# Patient Record
Sex: Female | Born: 1935
Health system: Southern US, Community
[De-identification: ages and names within clinical notes are randomized; demographics above are authoritative.]

## PROBLEM LIST (undated history)

## (undated) DIAGNOSIS — N189 Chronic kidney disease, unspecified: Secondary | ICD-10-CM

## (undated) DIAGNOSIS — R519 Headache, unspecified: Secondary | ICD-10-CM

## (undated) DIAGNOSIS — R51 Headache: Secondary | ICD-10-CM

## (undated) DIAGNOSIS — M1990 Unspecified osteoarthritis, unspecified site: Secondary | ICD-10-CM

## (undated) DIAGNOSIS — I10 Essential (primary) hypertension: Secondary | ICD-10-CM

## (undated) DIAGNOSIS — R42 Dizziness and giddiness: Secondary | ICD-10-CM

## (undated) DIAGNOSIS — E7800 Pure hypercholesterolemia, unspecified: Secondary | ICD-10-CM

## (undated) DIAGNOSIS — F0390 Unspecified dementia without behavioral disturbance: Secondary | ICD-10-CM

## (undated) HISTORY — PX: ABDOMINAL HYSTERECTOMY: SHX81

## (undated) HISTORY — PX: BLADDER SURGERY: SHX569

## (undated) HISTORY — PX: EYE SURGERY: SHX253

## (undated) HISTORY — PX: KNEE SURGERY: SHX244

## (undated) HISTORY — PX: CATARACT EXTRACTION: SUR2

## (undated) HISTORY — PX: CHOLECYSTECTOMY: SHX55

---

## 1997-12-07 ENCOUNTER — Encounter: Admission: RE | Admit: 1997-12-07 | Discharge: 1998-03-03 | Payer: Self-pay | Admitting: Anesthesiology

## 1999-11-01 ENCOUNTER — Other Ambulatory Visit: Admission: RE | Admit: 1999-11-01 | Discharge: 1999-11-01 | Payer: Self-pay | Admitting: Obstetrics & Gynecology

## 2000-11-21 ENCOUNTER — Other Ambulatory Visit: Admission: RE | Admit: 2000-11-21 | Discharge: 2000-11-21 | Payer: Self-pay | Admitting: Obstetrics & Gynecology

## 2001-11-27 ENCOUNTER — Other Ambulatory Visit: Admission: RE | Admit: 2001-11-27 | Discharge: 2001-11-27 | Payer: Self-pay | Admitting: Obstetrics & Gynecology

## 2002-10-05 ENCOUNTER — Emergency Department (HOSPITAL_COMMUNITY): Admission: EM | Admit: 2002-10-05 | Discharge: 2002-10-06 | Payer: Self-pay | Admitting: Emergency Medicine

## 2002-10-05 ENCOUNTER — Encounter: Payer: Self-pay | Admitting: Emergency Medicine

## 2002-10-06 ENCOUNTER — Encounter: Payer: Self-pay | Admitting: Emergency Medicine

## 2002-12-30 ENCOUNTER — Other Ambulatory Visit: Admission: RE | Admit: 2002-12-30 | Discharge: 2002-12-30 | Payer: Self-pay | Admitting: Obstetrics & Gynecology

## 2003-03-14 HISTORY — PX: COLONOSCOPY: SHX174

## 2004-01-13 ENCOUNTER — Other Ambulatory Visit: Admission: RE | Admit: 2004-01-13 | Discharge: 2004-01-13 | Payer: Self-pay | Admitting: Obstetrics & Gynecology

## 2007-01-16 ENCOUNTER — Encounter: Admission: RE | Admit: 2007-01-16 | Discharge: 2007-01-16 | Payer: Self-pay | Admitting: Internal Medicine

## 2007-02-08 ENCOUNTER — Ambulatory Visit (HOSPITAL_COMMUNITY): Admission: RE | Admit: 2007-02-08 | Discharge: 2007-02-09 | Payer: Self-pay | Admitting: General Surgery

## 2007-02-08 ENCOUNTER — Encounter (INDEPENDENT_AMBULATORY_CARE_PROVIDER_SITE_OTHER): Payer: Self-pay | Admitting: General Surgery

## 2008-07-06 ENCOUNTER — Encounter: Admission: RE | Admit: 2008-07-06 | Discharge: 2008-07-06 | Payer: Self-pay | Admitting: Internal Medicine

## 2008-07-17 ENCOUNTER — Ambulatory Visit: Admission: RE | Admit: 2008-07-17 | Discharge: 2008-07-17 | Payer: Self-pay | Admitting: *Deleted

## 2009-11-29 ENCOUNTER — Inpatient Hospital Stay (HOSPITAL_COMMUNITY): Admission: RE | Admit: 2009-11-29 | Discharge: 2009-12-01 | Payer: Self-pay | Admitting: Obstetrics & Gynecology

## 2010-05-26 LAB — URINALYSIS, ROUTINE W REFLEX MICROSCOPIC
Bilirubin Urine: NEGATIVE
Glucose, UA: NEGATIVE mg/dL
Ketones, ur: NEGATIVE mg/dL
Leukocytes, UA: NEGATIVE
Protein, ur: NEGATIVE mg/dL
pH: 5.5 (ref 5.0–8.0)

## 2010-05-26 LAB — CBC
HCT: 32.3 % — ABNORMAL LOW (ref 36.0–46.0)
Hemoglobin: 11.5 g/dL — ABNORMAL LOW (ref 12.0–15.0)
MCH: 32.8 pg (ref 26.0–34.0)
MCH: 32 pg (ref 26.0–34.0)
MCHC: 35.5 g/dL (ref 30.0–36.0)
MCHC: 35 g/dL (ref 30.0–36.0)
MCV: 91.5 fL (ref 78.0–100.0)
Platelets: 218 10*3/uL (ref 150–400)
RBC: 4.4 MIL/uL (ref 3.87–5.11)
RDW: 13.5 % (ref 11.5–15.5)
RDW: 13.8 % (ref 11.5–15.5)

## 2010-05-26 LAB — POTASSIUM: Potassium: 3.3 mEq/L — ABNORMAL LOW (ref 3.5–5.1)

## 2010-05-26 LAB — PROTIME-INR: Prothrombin Time: 13 seconds (ref 11.6–15.2)

## 2010-07-26 NOTE — Op Note (Signed)
NAMECARLETTE, PALMATIER               ACCOUNT NO.:  [id number]   MEDICAL RECORD NO.:  [id number]          PATIENT TYPE:  AMB   LOCATION:  DAY                          FACILITY:  [facility]   PHYSICIAN:  Anselm Pancoast. Weatherly, M.D.DATE OF BIRTH:  February 26, 1936   DATE OF PROCEDURE:  02/08/2007  DATE OF DISCHARGE:                               OPERATIVE REPORT   PREOPERATIVE DIAGNOSIS:  Chronic cholecystitis with stones.   POSTOPERATIVE DIAGNOSIS:  Chronic cholecystitis with stones.   OPERATIONS:  Laparoscopic cholecystectomy with cholangiogram.   SURGEON:  Anselm Pancoast. Zachery Dakins, M.D.   ASSISTANT:  Leonie Man, M.D.   General anesthesia.   HISTORY:  Kimberly Gregory is a 75 year old female that I saw several years  ago.  She was having symptoms of epigastric pain and had gallstones.  Her husband had recently had a stroke and she was caring for him and put  off doing anything.  She has had episodes of epigastric pain and she is  bothered with a lot of gas and takes Xanax and Prilosec and her regular  physician, Dr. Ludwig Clarks, repeated the ultrasound and recommended that she  proceed on with a laparoscopic cholecystectomy.  She is here for the  planned procedure today and was taken back to the operative suite.   She had been given 3 g of Unasyn.  She has PAS stockings.  Induction of  general anesthesia, the endotracheal tube was placed, an oral tube to  the stomach and the abdomen was prepped with Betadine solution and  draped in a sterile manner.  A small incision was made below the  umbilicus.  The fascia was identified, picked up between two Kochers and  very carefully entered into the peritoneal cavity.  A pursestring suture  of 0 Vicryl was placed and then the Hasson cannula introduced.  The  gallbladder was fairly distended but not acutely inflamed.  The upper 10-  mm trocar was of thus placed after anesthetizing the fascia with  Marcaine and entered into the of the falciform ligament under  direct  vision.  Two lateral 5-mm trocars were placed by Dr. Lurene Shadow after  anesthetizing the fascia and then the gallbladder was grasped, retracted  upward and laterally.  The left lobe of the liver was kind of in the  view but I could slip the upper 10-mm trocar sheath down and kind of  pushed the left lobe down so that we could visualize the proximal  portion of the gallbladder.  We then carefully opened the peritoneum,  identifying the cystic artery and the cystic duct.  The cystic duct was  clipped flush with the junction of the cystic duct and gallbladder and  the cystic artery, which was easily visualized, was doubly clipped  proximally, singly distally, and divided.  Then the [facility] was introduced into the cystic duct just proximal to  the clip, held in place with a clip and x-ray obtained that showed good  prompt filling of the extrahepatic biliary system, good flow into the  duodenum and the catheter was removed.  The cystic duct was triply  clipped and  then divided distal to the clips.  We then used the hook  electrocautery and kind of teased the gallbladder from its bed.  We  identified the posterior branch of the cystic artery that was doubly  clipped and then divided, and then the gallbladder was freed completely  from its bed.  There was a little vein on the anterior aspect that we  clipped since it was fairly prominent and also coagulated it.  I then  placed the gallbladder in an EndoCatch bag, switched the camera to the  upper 10-mm trocar and later withdrew the bag containing the  gallbladder.  We reinspected the gallbladder bed and where the clips had  been placed with good hemostasis, and then we irrigated again, aspirated  the fluid, and then I had withdrawn the gallbladder, placed an  additional figure-of-eight of 0 Vicryl in the fascia at the umbilicus  and tied both it and the pursestring.  I then anesthetized the fascia at  the umbilicus with  Marcaine, released carbon dioxide, the 5-mm ports  were withdrawn under direct vision, and then withdrew the upper 10-mm  trocar.  The subcutaneous wound was closed with 4-0 Vicryl, benzoin and  Steri-Strips on the skin.  The patient tolerated the procedure nicely  and we will let her spend the night and plan for going home in the  morning.  We will continue all of her chronic medications.           ______________________________  Anselm Pancoast. Zachery Dakins, M.D.     WJW/MEDQ  D:  02/08/2007  T:  02/08/2007  Job:  [id number]   cc:   Dr. Cindee Lame

## 2010-12-16 ENCOUNTER — Encounter: Payer: Self-pay | Admitting: Gastroenterology

## 2010-12-20 LAB — DIFFERENTIAL
Basophils Absolute: 0
Basophils Relative: 0
Eosinophils Absolute: 0.1 — ABNORMAL LOW
Eosinophils Relative: 1
Lymphocytes Relative: 35
Lymphs Abs: 2.6
Monocytes Absolute: 0.5
Monocytes Relative: 6
Neutro Abs: 4.2
Neutrophils Relative %: 57

## 2010-12-20 LAB — CBC
HCT: 36.7
Hemoglobin: 12.6
MCHC: 34.4
MCV: 83.4
RBC: 4.4
WBC: 7.4

## 2010-12-20 LAB — COMPREHENSIVE METABOLIC PANEL
ALT: 16
AST: 24
Albumin: 3.7
Alkaline Phosphatase: 64
BUN: 16
CO2: 32
Calcium: 9.4
Chloride: 107
Creatinine, Ser: 0.75
GFR calc Af Amer: >60
GFR calc non Af Amer: >60
Glucose, Bld: 104 — ABNORMAL HIGH
Potassium: 4.8
Sodium: 144
Total Bilirubin: 0.7
Total Protein: 7

## 2011-12-05 ENCOUNTER — Encounter: Payer: Self-pay | Admitting: Gastroenterology

## 2012-07-02 ENCOUNTER — Other Ambulatory Visit: Payer: Self-pay | Admitting: Internal Medicine

## 2012-07-02 DIAGNOSIS — R3129 Other microscopic hematuria: Secondary | ICD-10-CM

## 2012-07-03 ENCOUNTER — Ambulatory Visit
Admission: RE | Admit: 2012-07-03 | Discharge: 2012-07-03 | Disposition: A | Discharge status: Home / Self Care | Payer: Medicare Other | Source: Ambulatory Visit | Attending: Internal Medicine | Admitting: Internal Medicine

## 2012-07-03 DIAGNOSIS — R3129 Other microscopic hematuria: Secondary | ICD-10-CM

## 2012-07-30 ENCOUNTER — Other Ambulatory Visit: Payer: Self-pay | Admitting: Internal Medicine

## 2012-07-30 DIAGNOSIS — I10 Essential (primary) hypertension: Secondary | ICD-10-CM

## 2012-08-06 ENCOUNTER — Ambulatory Visit
Admission: RE | Admit: 2012-08-06 | Discharge: 2012-08-06 | Disposition: A | Discharge status: Home / Self Care | Payer: Medicare Other | Source: Ambulatory Visit | Attending: Internal Medicine | Admitting: Internal Medicine

## 2012-08-06 DIAGNOSIS — I10 Essential (primary) hypertension: Secondary | ICD-10-CM

## 2013-09-26 ENCOUNTER — Encounter: Payer: Self-pay | Admitting: Gastroenterology

## 2014-06-08 ENCOUNTER — Encounter: Payer: Self-pay | Admitting: Gastroenterology

## 2014-06-09 ENCOUNTER — Encounter: Payer: Self-pay | Admitting: Gastroenterology

## 2014-07-13 ENCOUNTER — Other Ambulatory Visit: Payer: Self-pay | Admitting: Obstetrics & Gynecology

## 2014-07-13 DIAGNOSIS — R928 Other abnormal and inconclusive findings on diagnostic imaging of breast: Secondary | ICD-10-CM

## 2014-07-16 ENCOUNTER — Other Ambulatory Visit: Payer: Self-pay

## 2014-07-17 ENCOUNTER — Ambulatory Visit
Admission: RE | Admit: 2014-07-17 | Discharge: 2014-07-17 | Disposition: A | Discharge status: Home / Self Care | Payer: Medicare Other | Source: Ambulatory Visit | Attending: Obstetrics & Gynecology | Admitting: Obstetrics & Gynecology

## 2014-07-17 DIAGNOSIS — R928 Other abnormal and inconclusive findings on diagnostic imaging of breast: Secondary | ICD-10-CM

## 2014-10-14 ENCOUNTER — Emergency Department (HOSPITAL_COMMUNITY): Payer: Medicare Other

## 2014-10-14 ENCOUNTER — Observation Stay (HOSPITAL_COMMUNITY)
Admission: EM | Admit: 2014-10-14 | Discharge: 2014-10-17 | Disposition: A | Discharge status: Home / Self Care | Payer: Medicare Other | Attending: Internal Medicine | Admitting: Internal Medicine

## 2014-10-14 ENCOUNTER — Encounter (HOSPITAL_COMMUNITY): Payer: Self-pay | Admitting: Neurology

## 2014-10-14 DIAGNOSIS — R55 Syncope and collapse: Secondary | ICD-10-CM

## 2014-10-14 DIAGNOSIS — R2689 Other abnormalities of gait and mobility: Secondary | ICD-10-CM | POA: Insufficient documentation

## 2014-10-14 DIAGNOSIS — N390 Urinary tract infection, site not specified: Secondary | ICD-10-CM | POA: Diagnosis present

## 2014-10-14 DIAGNOSIS — R269 Unspecified abnormalities of gait and mobility: Secondary | ICD-10-CM

## 2014-10-14 DIAGNOSIS — R42 Dizziness and giddiness: Secondary | ICD-10-CM | POA: Diagnosis not present

## 2014-10-14 DIAGNOSIS — E780 Pure hypercholesterolemia: Secondary | ICD-10-CM | POA: Diagnosis not present

## 2014-10-14 DIAGNOSIS — Z79899 Other long term (current) drug therapy: Secondary | ICD-10-CM | POA: Insufficient documentation

## 2014-10-14 DIAGNOSIS — Z720 Tobacco use: Secondary | ICD-10-CM | POA: Diagnosis not present

## 2014-10-14 DIAGNOSIS — I10 Essential (primary) hypertension: Secondary | ICD-10-CM | POA: Diagnosis not present

## 2014-10-14 DIAGNOSIS — E785 Hyperlipidemia, unspecified: Secondary | ICD-10-CM | POA: Diagnosis present

## 2014-10-14 HISTORY — DX: Dizziness and giddiness: R42

## 2014-10-14 LAB — URINALYSIS, ROUTINE W REFLEX MICROSCOPIC
Bilirubin Urine: NEGATIVE
GLUCOSE, UA: NEGATIVE mg/dL
Hgb urine dipstick: NEGATIVE
KETONES UR: 15 mg/dL — AB
NITRITE: POSITIVE — AB
PH: 6 (ref 5.0–8.0)
Protein, ur: NEGATIVE mg/dL
Specific Gravity, Urine: 1.018 (ref 1.005–1.030)
UROBILINOGEN UA: 1 mg/dL (ref 0.0–1.0)

## 2014-10-14 LAB — CBC WITH DIFFERENTIAL/PLATELET
BASOS ABS: 0 10*3/uL (ref 0.0–0.1)
BASOS PCT: 1 % (ref 0–1)
Eosinophils Absolute: 0.1 10*3/uL (ref 0.0–0.7)
Eosinophils Relative: 1 % (ref 0–5)
HEMATOCRIT: 34.3 % — AB (ref 36.0–46.0)
Hemoglobin: 12.3 g/dL (ref 12.0–15.0)
LYMPHS ABS: 1.9 10*3/uL (ref 0.7–4.0)
Lymphocytes Relative: 35 % (ref 12–46)
MCH: 30.2 pg (ref 26.0–34.0)
MCHC: 35.9 g/dL (ref 30.0–36.0)
MCV: 84.3 fL (ref 78.0–100.0)
MONO ABS: 0.5 10*3/uL (ref 0.1–1.0)
Monocytes Relative: 8 % (ref 3–12)
NEUTROS ABS: 2.9 10*3/uL (ref 1.7–7.7)
NEUTROS PCT: 55 % (ref 43–77)
PLATELETS: 197 10*3/uL (ref 150–400)
RBC: 4.07 MIL/uL (ref 3.87–5.11)
RDW: 15 % (ref 11.5–15.5)
WBC: 5.4 10*3/uL (ref 4.0–10.5)

## 2014-10-14 LAB — CBC
HCT: 33.5 % — ABNORMAL LOW (ref 36.0–46.0)
HEMOGLOBIN: 11.7 g/dL — AB (ref 12.0–15.0)
MCH: 30.1 pg (ref 26.0–34.0)
MCHC: 34.9 g/dL (ref 30.0–36.0)
MCV: 86.1 fL (ref 78.0–100.0)
Platelets: 192 10*3/uL (ref 150–400)
RBC: 3.89 MIL/uL (ref 3.87–5.11)
RDW: 15.1 % (ref 11.5–15.5)
WBC: 6 10*3/uL (ref 4.0–10.5)

## 2014-10-14 LAB — URINE MICROSCOPIC-ADD ON

## 2014-10-14 LAB — BASIC METABOLIC PANEL
ANION GAP: 7 (ref 5–15)
BUN: 25 mg/dL — AB (ref 6–20)
CHLORIDE: 111 mmol/L (ref 101–111)
CO2: 25 mmol/L (ref 22–32)
Calcium: 9.1 mg/dL (ref 8.9–10.3)
Creatinine, Ser: 0.89 mg/dL (ref 0.44–1.00)
GFR calc non Af Amer: >60 mL/min (ref >60)
GLUCOSE: 87 mg/dL (ref 65–99)
POTASSIUM: 4 mmol/L (ref 3.5–5.1)
Sodium: 143 mmol/L (ref 135–145)

## 2014-10-14 LAB — CREATININE, SERUM
Creatinine, Ser: 0.83 mg/dL (ref 0.44–1.00)
GFR calc Af Amer: >60 mL/min (ref >60)
GFR calc non Af Amer: >60 mL/min (ref >60)

## 2014-10-14 LAB — TROPONIN I
Troponin I: <0.03 ng/mL (ref <0.031)
Troponin I: <0.03 ng/mL (ref <0.031)

## 2014-10-14 MED ORDER — PRAVASTATIN SODIUM 40 MG PO TABS
40.0000 mg | ORAL_TABLET | Freq: Every day | ORAL | Status: DC
  Administered 2014-10-14 – 2014-10-16 (×3): 40 mg via ORAL
  Filled 2014-10-14 (×3): qty 1

## 2014-10-14 MED ORDER — ONDANSETRON HCL 4 MG/2ML IJ SOLN: 4.0000 mg | Freq: Four times a day (QID) | INTRAMUSCULAR | Status: DC | PRN

## 2014-10-14 MED ORDER — CIPROFLOXACIN HCL 500 MG PO TABS
250.0000 mg | ORAL_TABLET | Freq: Two times a day (BID) | ORAL | Status: DC
  Administered 2014-10-14 – 2014-10-15 (×3): 250 mg via ORAL
  Filled 2014-10-14 (×3): qty 1

## 2014-10-14 MED ORDER — ALPRAZOLAM 0.5 MG PO TABS
0.5000 mg | ORAL_TABLET | Freq: Three times a day (TID) | ORAL | Status: DC | PRN
  Administered 2014-10-14 – 2014-10-16 (×3): 0.5 mg via ORAL
  Filled 2014-10-14 (×3): qty 1

## 2014-10-14 MED ORDER — ASPIRIN EC 81 MG PO TBEC
81.0000 mg | DELAYED_RELEASE_TABLET | Freq: Every day | ORAL | Status: DC
  Administered 2014-10-14 – 2014-10-17 (×4): 81 mg via ORAL
  Filled 2014-10-14 (×4): qty 1

## 2014-10-14 MED ORDER — ONDANSETRON HCL 4 MG PO TABS: 4.0000 mg | ORAL_TABLET | Freq: Four times a day (QID) | ORAL | Status: DC | PRN

## 2014-10-14 MED ORDER — MECLIZINE HCL 25 MG PO TABS
25.0000 mg | ORAL_TABLET | Freq: Three times a day (TID) | ORAL | Status: DC | PRN
  Administered 2014-10-14 – 2014-10-15 (×2): 25 mg via ORAL
  Filled 2014-10-14 (×2): qty 1

## 2014-10-14 MED ORDER — SODIUM CHLORIDE 0.45 % IV SOLN
INTRAVENOUS | Status: DC
Start: 2014-10-14 — End: 2014-10-15
  Administered 2014-10-14: 19:00:00 via INTRAVENOUS

## 2014-10-14 MED ORDER — NITROFURANTOIN MONOHYD MACRO 100 MG PO CAPS
100.0000 mg | ORAL_CAPSULE | Freq: Two times a day (BID) | ORAL | Status: DC
  Filled 2014-10-14 (×2): qty 1

## 2014-10-14 MED ORDER — SODIUM CHLORIDE 0.9 % IV BOLUS (SEPSIS)
500.0000 mL | Freq: Once | INTRAVENOUS | Status: AC
  Administered 2014-10-14: 500 mL via INTRAVENOUS

## 2014-10-14 MED ORDER — ENOXAPARIN SODIUM 40 MG/0.4ML ~~LOC~~ SOLN
40.0000 mg | SUBCUTANEOUS | Status: DC
  Administered 2014-10-14 – 2014-10-16 (×3): 40 mg via SUBCUTANEOUS
  Filled 2014-10-14 (×3): qty 0.4

## 2014-10-14 MED ORDER — ACETAMINOPHEN 325 MG PO TABS
650.0000 mg | ORAL_TABLET | Freq: Four times a day (QID) | ORAL | Status: DC | PRN
  Administered 2014-10-14 – 2014-10-17 (×5): 650 mg via ORAL
  Filled 2014-10-14 (×5): qty 2

## 2014-10-14 MED ORDER — ENALAPRIL MALEATE 5 MG PO TABS
5.0000 mg | ORAL_TABLET | Freq: Every day | ORAL | Status: DC
  Administered 2014-10-14 – 2014-10-15 (×2): 5 mg via ORAL
  Filled 2014-10-14 (×2): qty 1

## 2014-10-14 MED ORDER — MECLIZINE HCL 25 MG PO TABS
25.0000 mg | ORAL_TABLET | Freq: Once | ORAL | Status: AC
  Administered 2014-10-14: 25 mg via ORAL
  Filled 2014-10-14: qty 1

## 2014-10-14 MED ORDER — ENOXAPARIN SODIUM 40 MG/0.4ML ~~LOC~~ SOLN: 40.0000 mg | SUBCUTANEOUS | Status: DC

## 2014-10-14 MED ORDER — PHENAZOPYRIDINE HCL 200 MG PO TABS
200.0000 mg | ORAL_TABLET | Freq: Three times a day (TID) | ORAL | Status: AC
  Administered 2014-10-14 – 2014-10-16 (×6): 200 mg via ORAL
  Filled 2014-10-14 (×6): qty 1

## 2014-10-14 NOTE — ED Provider Notes (Signed)
CSN: [id number]     Arrival date & time 10/14/14  0709 History   First MD Initiated Contact with Patient 10/14/14 0719     Chief Complaint  Patient presents with  . Fall  . Dizziness     (Consider location/radiation/quality/duration/timing/severity/associated sxs/prior Treatment) HPI Comments: 79 year old female with history of high blood pressure, high cholesterol presents with vertigo. Patient woke up at 6 in the morning feeling dizzy and tried to walk the bathroom causing her to fall hitting her right scapular region. No loss of consciousness. Patient had brief confusion that resolved. No history of similar. No history of stroke known. Patient still has persistent symptoms with mild improvement and unsteady gait persists. Worse with head movement standing however also constant.  Patient is a 79 y.o. female presenting with fall and dizziness. The history is provided by the patient.  Fall Pertinent negatives include no chest pain, no abdominal pain, no headaches and no shortness of breath.  Dizziness Associated symptoms: no chest pain, no headaches, no shortness of breath and no vomiting     History reviewed. No pertinent past medical history. Past Surgical History  Procedure Laterality Date  . Bladder surgery     No family history on file. History  Substance Use Topics  . Smoking status: Current Every Day Smoker  . Smokeless tobacco: Not on file  . Alcohol Use: No   OB History    No data available     Review of Systems  Constitutional: Negative for fever and chills.  HENT: Negative for congestion.   Eyes: Negative for visual disturbance.  Respiratory: Negative for shortness of breath.   Cardiovascular: Negative for chest pain.  Gastrointestinal: Negative for vomiting and abdominal pain.  Genitourinary: Negative for dysuria and flank pain.  Musculoskeletal: Positive for arthralgias. Negative for back pain, neck pain and neck stiffness.  Skin: Negative for rash.   Neurological: Positive for dizziness. Negative for light-headedness and headaches.      Allergies  Review of patient's allergies indicates no known allergies.  Home Medications   Prior to Admission medications   Medication Sig Start Date End Date Taking? Authorizing Provider  ALPRAZolam Prudy Feeler) 0.5 MG tablet Take 0.5 mg by mouth 3 (three) times daily as needed. For anxiety 09/08/14  Yes Historical Provider, MD  enalapril (VASOTEC) 5 MG tablet Take 5 mg by mouth daily. 10/08/14  Yes Historical Provider, MD  lovastatin (MEVACOR) 40 MG tablet Take 40 mg by mouth daily. 09/21/14  Yes Historical Provider, MD   BP 149/58 mmHg  Pulse 64  Temp(Src) 97.9 F (36.6 C) (Oral)  Resp 16  Ht 5' 4.5" (1.638 m)  Wt 126 lb (57.153 kg)  BMI 21.30 kg/m2  SpO2 97% Physical Exam  Constitutional: She is oriented to person, place, and time. She appears well-developed and well-nourished.  HENT:  Head: Normocephalic and atraumatic.  Eyes: Conjunctivae are normal. Right eye exhibits no discharge. Left eye exhibits no discharge.  Neck: Normal range of motion. Neck supple. No tracheal deviation present.  Cardiovascular: Normal rate and regular rhythm.   Pulmonary/Chest: Effort normal and breath sounds normal.  Abdominal: Soft. She exhibits no distension. There is no tenderness. There is no guarding.  Musculoskeletal: She exhibits tenderness (no midline thoracic or cervical tenderness, mild tenderness right medial scapula). She exhibits no edema.  Neurological: She is alert and oriented to person, place, and time. Gait abnormal. Coordination normal.  5+ strength in UE and LE with f/e at major joints. Sensation to palpation intact in  UE and LE. CNs 2-12 grossly intact.  EOMFI.  PERRL.    Visual fields intact to finger testing. No nystagmus, no vertical skew  Skin: Skin is warm. No rash noted.  Psychiatric: She has a normal mood and affect.  Nursing note and vitals reviewed.   ED Course  Procedures  (including critical care time) Labs Review Labs Reviewed  BASIC METABOLIC PANEL - Abnormal; Notable for the following:    BUN 25 (*)    All other components within normal limits  CBC WITH DIFFERENTIAL/PLATELET - Abnormal; Notable for the following:    HCT 34.3 (*)    All other components within normal limits  URINALYSIS, ROUTINE W REFLEX MICROSCOPIC (NOT AT [facility]) - Abnormal; Notable for the following:    Color, Urine ORANGE (*)    Ketones, ur 15 (*)    Nitrite POSITIVE (*)    Leukocytes, UA SMALL (*)    All other components within normal limits  TROPONIN I  URINE MICROSCOPIC-ADD ON    Imaging Review Dg Chest 2 View  10/14/2014   CLINICAL DATA:  Dizziness.  Fall earlier today  EXAM: CHEST  2 VIEW  COMPARISON:  July 06, 2008  FINDINGS: There is no edema or consolidation. Heart size and pulmonary vascularity are normal. No adenopathy. No pneumothorax. No bone lesions.  IMPRESSION: No edema or consolidation.   Electronically Signed   By: Bretta Bang III M.D.   On: 10/14/2014 09:44   [name] Contrast  10/14/2014   CLINICAL DATA:  Patient complains of new onset of dizziness.  EXAM: MRI HEAD WITHOUT CONTRAST  TECHNIQUE: Multiplanar, multiecho pulse sequences of the brain and surrounding structures were obtained without intravenous contrast.  COMPARISON:  None.  FINDINGS: No evidence for acute infarction, hemorrhage, mass lesion, hydrocephalus, or extra-axial fluid. Moderate cerebral and cerebellar atrophy. Mild subcortical and periventricular T2 and FLAIR hyperintensities, likely chronic microvascular ischemic change.  Flow voids are maintained throughout the carotid, basilar, and vertebral arteries. There are no areas of chronic hemorrhage.  Partial empty sella. No tonsillar herniation. Upper cervical region unremarkable.  BILATERAL cataract extraction. Mild chronic sinus disease. Trace LEFT mastoid effusion. Extracranial soft tissues appear unremarkable.  IMPRESSION: No acute intracranial  findings.  Atrophy and small vessel disease.  Partial empty sella.   Electronically Signed   By: Elsie Stain M.D.   On: 10/14/2014 09:28     EKG Interpretation   Date/Time:  Wednesday October 14 2014 07:15:51 EDT Ventricular Rate:  73 PR Interval:  140 QRS Duration: 95 QT Interval:  403 QTC Calculation: 444 R Axis:   -33 Text Interpretation:  Sinus rhythm Left axis deviation Baseline wander in  lead(s) II III aVF Confirmed by Kaelene Elliston  MD, Colten Desroches ([id number]) on 10/14/2014  7:24:50 AM      MDM   Final diagnoses:  Vertigo  Gait difficulty   Patient presents with vertigo and balance issues. On attempt to walk patient unable to walk by herself which is abnormal for her. Patient normally walks without assistance. Plan for MRI the brain to look for stroke, screening blood work small IV fluid bolus.  Patient with minimal improvement in the ER. MRI results reviewed no acute stroke, chest x-ray reviewed no acute findings. Vitals unremarkable and stable near. Patient unable to ambulate on second recheck due to symptoms. Discussed with medicine hospitalist for observation/admission.  The patients results and plan were reviewed and discussed.   Any x-rays performed were independently reviewed by myself.   Differential diagnosis  were considered with the presenting HPI.  Medications  meclizine (ANTIVERT) tablet 25 mg (25 mg Oral Given 10/14/14 0810)  sodium chloride 0.9 % bolus 500 mL (500 mLs Intravenous New Bag/Given 10/14/14 0810)    Filed Vitals:   10/14/14 0730 10/14/14 0745 10/14/14 0946 10/14/14 0950  BP: 153/62 162/79 142/49 149/58  Pulse: 72 75 65 64  Temp:      TempSrc:      Resp: 18 18 18 16   Height:      Weight:      SpO2: 96% 98% 96% 97%    Final diagnoses:  Vertigo  Gait difficulty    Admission/ observation were discussed with the admitting physician, patient and/or family and they are comfortable with the plan.     Blane Ohara, MD 10/14/14 1028

## 2014-10-14 NOTE — ED Notes (Signed)
Per ems- pt comes from home where this morning she woke up at 0600 to use the bathroom, felt dizzy but walked to the bathroom and fell on the way to the bathroom. Denies loc, did not hit her head. Fell landing on her right shoulder blade. Initially pt was disoriented to year, now is a x 4. BP 170/68, HR 90 SR, CBG 94, 94% RA

## 2014-10-14 NOTE — ED Notes (Signed)
Patient transported to MRI 

## 2014-10-14 NOTE — H&P (Signed)
[facility] History and Physical  Kimberly Gregory ZOX:[id number] DOB: Nov 18, 1935 DOA: 10/14/2014  Referring physician: Arley Phenix, M.D. PCP: No primary care provider on file.   Chief Complaint: Dizziness  HPI: Kimberly Gregory is a 79 y.o. female with a past medical history of essential hypertension, hyperlipidemia who comes to the ER with complaints of severe dizziness since she woke up this morning sometime after 5:00 a.m. Per patient, she was sleeping this morning, when she got up to go to the bathroom and felt an intense fainting sensation. She she states that she fell down, hit her head and shoulder on her right side. She denies loss of consciousness, chest pain, palpitations, diaphoresis, nausea, tinnitus. She denies pitting edema of the lower extremities, PND or orthopnea. She denies cold-like symptoms.   She also complains of chronic dysuria and urgency. She states that this is as a result of  bladder surgery suture complication from a couple years ago. She is scheduled to see GYN later this month for this problem. She is currently in no acute distress.   Review of Systems:  Constitutional:  No weight loss, night sweats, Fevers, chills, fatigue.  HEENT:  Occasional headaches, Denies Difficulty swallowing,Tooth/dental problems,Sore throat,  No sneezing, itching, ear ache, nasal congestion, post nasal drip,  Cardio-vascular:  severe dizziness (which the patient describes as intense fainting sensation), No chest pain, Orthopnea, PND, swelling in lower extremities, anasarca,  palpitations  GI:  No heartburn, indigestion, abdominal pain, nausea, vomiting, diarrhea, change in bowel habits, loss of appetite  Resp:  No shortness of breath with exertion or at rest. No excess mucus, no productive cough, No non-productive cough, No coughing up of blood.No change in color of mucus.No wheezing.No chest wall deformity  Skin:  no rash or lesions.  GU:  Positive dysuria, positive  urgency and frequency. negative change in color of urine,  No flank pain.  Musculoskeletal:  No joint pain or swelling. No decreased range of motion. No back pain.  Psych:  No change in mood or affect. No depression or anxiety. No memory loss.   History reviewed. No pertinent past medical history. Past Surgical History  Procedure Laterality Date  . Bladder surgery     Social History:  reports that she has been smoking.  She does not have any smokeless tobacco history on file. She reports that she does not drink alcohol. Her drug history is not on file.  No Known Allergies  History reviewed. No pertinent family history.   Prior to Admission medications   Medication Sig Start Date End Date Taking? Authorizing Provider  ALPRAZolam Prudy Feeler) 0.5 MG tablet Take 0.5 mg by mouth 3 (three) times daily as needed. For anxiety 09/08/14  Yes Historical Provider, MD  enalapril (VASOTEC) 5 MG tablet Take 5 mg by mouth daily. 10/08/14  Yes Historical Provider, MD  lovastatin (MEVACOR) 40 MG tablet Take 40 mg by mouth daily. 09/21/14  Yes Historical Provider, MD   Physical Exam: Filed Vitals:   10/14/14 0745 10/14/14 0946 10/14/14 0950 10/14/14 1042  BP: 162/79 142/49 149/58 146/63  Pulse: 75 65 64 65  Temp:      TempSrc:      Resp: 18 18 16 18   Height:      Weight:      SpO2: 98% 96% 97% 95%    Wt Readings from Last 3 Encounters:  10/14/14 57.153 kg (126 lb)    General:  Appears calm and comfortable Eyes: PERRL, normal lids, irises &  conjunctiva ENT: grossly normal hearing, lips & tongue are mildly dry. Neck: no LAD, no bruits, masses or thyromegaly Cardiovascular: RRR, no m/r/g. No LE edema. Telemetry: SR, no arrhythmias  Respiratory: CTA bilaterally, no w/r/r. Normal respiratory effort. Abdomen: soft, ntnd Skin: no rash or induration seen on limited exam Musculoskeletal: grossly normal tone BUE/BLE Psychiatric: grossly normal mood and affect, speech fluent and  appropriate Neurologic: grossly non-focal, unable to evaluate gait or perform Romberg test.           Labs on Admission:  Basic Metabolic Panel:  Recent Labs Lab 10/14/14 0806  NA 143  K 4.0  CL 111  CO2 25  GLUCOSE 87  BUN 25*  CREATININE 0.89  CALCIUM 9.1   Liver Function Tests: No results for input(s): AST, ALT, ALKPHOS, BILITOT, PROT, ALBUMIN in the last 168 hours. No results for input(s): LIPASE, AMYLASE in the last 168 hours. No results for input(s): AMMONIA in the last 168 hours. CBC:  Recent Labs Lab 10/14/14 0806  WBC 5.4  NEUTROABS 2.9  HGB 12.3  HCT 34.3*  MCV 84.3  PLT 197   Cardiac Enzymes:  Recent Labs Lab 10/14/14 0806  TROPONINI <0.03   Radiological Exams on Admission: Dg Chest 2 View  10/14/2014   CLINICAL DATA:  Dizziness.  Fall earlier today  EXAM: CHEST  2 VIEW  COMPARISON:  July 06, 2008  FINDINGS: There is no edema or consolidation. Heart size and pulmonary vascularity are normal. No adenopathy. No pneumothorax. No bone lesions.  IMPRESSION: No edema or consolidation.   Electronically Signed   By: Bretta Bang III M.D.   On: 10/14/2014 09:44   [name] Contrast  10/14/2014   CLINICAL DATA:  Patient complains of new onset of dizziness.  EXAM: MRI HEAD WITHOUT CONTRAST  TECHNIQUE: Multiplanar, multiecho pulse sequences of the brain and surrounding structures were obtained without intravenous contrast.  COMPARISON:  None.  FINDINGS: No evidence for acute infarction, hemorrhage, mass lesion, hydrocephalus, or extra-axial fluid. Moderate cerebral and cerebellar atrophy. Mild subcortical and periventricular T2 and FLAIR hyperintensities, likely chronic microvascular ischemic change.  Flow voids are maintained throughout the carotid, basilar, and vertebral arteries. There are no areas of chronic hemorrhage.  Partial empty sella. No tonsillar herniation. Upper cervical region unremarkable.  BILATERAL cataract extraction. Mild chronic sinus disease.  Trace LEFT mastoid effusion. Extracranial soft tissues appear unremarkable.  IMPRESSION: No acute intracranial findings.  Atrophy and small vessel disease.  Partial empty sella.   Electronically Signed   By: Elsie Stain M.D.   On: 10/14/2014 09:28    EKG: Independently reviewed.  Vent. rate 73 BPM PR interval 140 ms QRS duration 95 ms QT/QTc 403/444 ms P-R-T axes 69 -33 61  Sinus rhythm Left axis deviation Baseline wander in lead(s) II III aVF  Assessment/Plan Principal Problem:   Severe dizziness Active Problems:   UTI (lower urinary tract infection)   Benign essential HTN   Hyperlipidemia   1. Admitted for telemetry monitoring.  2. Serial troponin levels trending.  3. Check carotid Doppler. Check echocardiogram. 4. Nitrofurantoin and pyridium orally. Patient to follow-up with GYN as scheduled. 5. Continue enalapril and blood pressure monitoring for hypertension. 6. Continue statin for hyperlipidemia.  Code Status: Full code. DVT Prophylaxis: Lovenox SQ. Family CommunicationQuenten Raven Daughter [phone]     Haynes,Pat Sister [phone]    Disposition Plan: Home with outpatient follow-up.  Time spent: Over 60 minutes.  Bobette Mo [facility] Pager [phone].

## 2014-10-14 NOTE — ED Notes (Signed)
Pt returned from MRI °

## 2014-10-14 NOTE — ED Notes (Signed)
Pt was unable to walk d/t extreme dizziness. Pt needs assistance from staff members when standing or ambulating.

## 2014-10-15 ENCOUNTER — Observation Stay (HOSPITAL_COMMUNITY): Payer: Medicare Other

## 2014-10-15 ENCOUNTER — Observation Stay (HOSPITAL_BASED_OUTPATIENT_CLINIC_OR_DEPARTMENT_OTHER): Payer: Medicare Other

## 2014-10-15 DIAGNOSIS — I2510 Atherosclerotic heart disease of native coronary artery without angina pectoris: Secondary | ICD-10-CM

## 2014-10-15 DIAGNOSIS — R55 Syncope and collapse: Secondary | ICD-10-CM

## 2014-10-15 DIAGNOSIS — R42 Dizziness and giddiness: Principal | ICD-10-CM

## 2014-10-15 LAB — URINE CULTURE: Culture: NO GROWTH

## 2014-10-15 LAB — GLUCOSE, CAPILLARY: Glucose-Capillary: 83 mg/dL (ref 65–99)

## 2014-10-15 MED ORDER — SCOPOLAMINE 1 MG/3DAYS TD PT72
1.0000 | MEDICATED_PATCH | TRANSDERMAL | Status: DC
  Administered 2014-10-15: 1.5 mg via TRANSDERMAL
  Filled 2014-10-15 (×2): qty 1

## 2014-10-15 MED ORDER — DOCUSATE SODIUM 100 MG PO CAPS
100.0000 mg | ORAL_CAPSULE | Freq: Two times a day (BID) | ORAL | Status: DC | PRN
  Administered 2014-10-15 – 2014-10-16 (×3): 100 mg via ORAL
  Filled 2014-10-15 (×3): qty 1

## 2014-10-15 MED ORDER — SODIUM CHLORIDE 0.9 % IV SOLN
INTRAVENOUS | Status: DC
  Administered 2014-10-15 – 2014-10-16 (×3): via INTRAVENOUS

## 2014-10-15 NOTE — Progress Notes (Signed)
  Echocardiogram 2D Echocardiogram has been performed.  Kimberly Gregory 10/15/2014, 11:30 AM

## 2014-10-15 NOTE — Progress Notes (Signed)
Vestibular Evaluation  Patient's symptoms consistent with BPPV (dysequilibrium, vertigo, <60 second duration). Pt had taken meclizine at 11:30 and was unable to elicit nystagmus during BPPV testing. Pt was symptomatic with several test positions. Treated for Rt posterior canal and attempted walking with symptoms persistent. Treated for Rt horizontal canal and again walked. Symptoms lessened, however persist with major losses of balance (even with walker).   Requested MD discontinue order for meclizine and will reassess pt 8/5 AM.    10/15/14 1617  Vestibular Assessment  General Observation Pt describes dysequilibrium with occasional spinning, especially with walking  Symptom Behavior  Type of Dizziness Imbalance  Frequency of Dizziness every time stands & walks  Duration of Dizziness <60 seconds  Aggravating Factors Sit to stand;Activity in general  Relieving Factors Lying supine;Closing eyes;Rest  Occulomotor Exam  Occulomotor Alignment Normal  Spontaneous Absent  Gaze-induced Absent  Positional Testing  Dix-Hallpike Dix-Hallpike Right;Dix-Hallpike Left  Horizontal Canal Testing Horizontal Canal Right;Horizontal Canal Left;Horizontal Canal Right Intensity;Horizontal Canal Left Intensity  Dix-Hallpike Right  Dix-Hallpike Right Duration 15  Dix-Hallpike Right Symptoms No nystagmus (slight spin and then "woozy")  Dix-Hallpike Left  Dix-Hallpike Left Duration 15  Dix-Hallpike Left Symptoms No nystagmus (less severe, no spinning just wooziness)  Horizontal Canal Right  Horizontal Canal Right Duration 5 sec  Horizontal Canal Right Symptoms (slight spin, general wooziness, then subsides)  Horizontal Canal Left  Horizontal Canal Left Duration 0  Horizontal Canal Left Symptoms Normal  Horizontal Canal Right Intensity  Horizontal Canal Right Intensity Mild  Cognition  Cognition Orientation Level Disoriented to situation  Cognition Comment forgetfull; repeats questions and stories   Positional Sensitivities  Supine to Sitting 3  Right Hallpike 3  Orthostatics  Orthostatics Comment normal on admission    10/15/2014 Veda Canning, PT Pager: [phone]

## 2014-10-15 NOTE — Progress Notes (Signed)
*  PRELIMINARY RESULTS* Vascular Ultrasound Carotid Duplex (Doppler) has been completed.  Preliminary findings: Right = 40-59% ICA stenosis. Left =  1-39% ICA stenosis.  Vertebral artery flow is antegrade.      Farrel Demark, RDMS, RVT  10/15/2014, 12:09 PM

## 2014-10-15 NOTE — Evaluation (Signed)
Physical Therapy Evaluation (see also separate vestibular evaluation) Patient Details Name: Kimberly Gregory MRN: [id number] DOB: February 16, 1936 Today's Date: 10/15/2014   History of Present Illness  Adm 8/3 s/p onset of dizziness with fall (hit head landing on Rt side).  Clinical Impression  Pt admitted with above diagnosis. Pt currently with functional limitations due to the deficits listed below (see PT Problem List).  Pt will benefit from skilled PT to increase their independence and safety with mobility to allow discharge to the venue listed below.       Follow Up Recommendations  (TBA after 8/5 session HH vs OP for vestib rehab)    Equipment Recommendations  None recommended by PT    Recommendations for Other Services OT consult     Precautions / Restrictions Precautions Precautions: Fall      Mobility  Bed Mobility Overal bed mobility: Needs Assistance Bed Mobility: Sidelying to Sit;Sit to Sidelying   Sidelying to sit: Mod assist     Sit to sidelying: Mod assist General bed mobility comments: due to imbalance/dizzy  Transfers Overall transfer level: Needs assistance Equipment used: 1 person hand held assist Transfers: Sit to/from Stand Sit to Stand: Min assist         General transfer comment: x 4; no loss of balance  Ambulation/Gait Ambulation/Gait assistance: Mod assist Ambulation Distance (Feet): 10 Feet (15, 15, 15) Assistive device: Rolling walker (2 wheeled);1 person hand held assist (holdign IV pole) Gait Pattern/deviations: Step-through pattern;Decreased stride length;Staggering left;Drifts right/left   Gait velocity interpretation: <1.8 ft/sec, indicative of risk for recurrent falls General Gait Details: major loss of balance to Left x1   Stairs            Wheelchair Mobility    Modified Rankin (Stroke Patients Only)       Balance Overall balance assessment: History of Falls;Needs assistance                                            Pertinent Vitals/Pain Pain Assessment: Faces Faces Pain Scale: Hurts whole lot Pain Location: Rt posterior ribs Pain Intervention(s): Limited activity within patient's tolerance;Monitored during session;Repositioned    Home Living Family/patient expects to be discharged to:: Private residence Living Arrangements: Spouse/significant other (s/p CVA and pt cares for him) Available Help at Discharge: Family;Available PRN/intermittently                  Prior Function Level of Independence: Independent         Comments: Cares for her husband (s/p CVA)     Hand Dominance        Extremity/Trunk Assessment                         Communication   Communication: No difficulties  Cognition Arousal/Alertness: Awake/alert Behavior During Therapy: WFL for tasks assessed/performed Overall Cognitive Status: No family/caregiver present to determine baseline cognitive functioning (repeats stories, questions, forgetful per RN)       Memory: Decreased short-term memory              General Comments      Exercises        Assessment/Plan    PT Assessment Patient needs continued PT services  PT Diagnosis Difficulty walking   PT Problem List Decreased activity tolerance;Decreased balance;Decreased mobility;Decreased cognition;Decreased knowledge of use of DME;Decreased safety awareness;Decreased knowledge  of precautions;Pain  PT Treatment Interventions DME instruction;Gait training;Stair training;Functional mobility training;Therapeutic activities;Therapeutic exercise;Balance training;Cognitive remediation;Patient/family education   PT Goals (Current goals can be found in the Care Plan section) Acute Rehab PT Goals Patient Stated Goal: go home tomorrow PT Goal Formulation: With patient Time For Goal Achievement: 10/22/14 Potential to Achieve Goals: Good    Frequency Min 5X/week   Barriers to discharge Decreased caregiver support       Co-evaluation               End of Session Equipment Utilized During Treatment: Gait belt Activity Tolerance: Patient tolerated treatment well;Patient limited by pain Patient left: in chair;with call bell/phone within reach;with chair alarm set Nurse Communication: Mobility status;Other (comment) (avoid meclizine)    Functional Assessment Tool Used: clinical judgement Functional Limitation: Mobility: Walking and moving around Mobility: Walking and Moving Around Current Status [phone]): At least 40 percent but less than 60 percent impaired, limited or restricted Mobility: Walking and Moving Around Goal Status [phone]): At least 1 percent but less than 20 percent impaired, limited or restricted    Time: 1510-1608 PT Time Calculation (min) (ACUTE ONLY): 58 min   Charges:   PT Evaluation $Initial PT Evaluation Tier I: 1 Procedure PT Treatments $Gait Training: 8-22 mins $Therapeutic Activity: 8-22 mins $Canalith Rep Proc: 8-22 mins   PT G Codes:   PT G-Codes **NOT FOR INPATIENT CLASS** Functional Assessment Tool Used: clinical judgement Functional Limitation: Mobility: Walking and moving around Mobility: Walking and Moving Around Current Status (A2130): At least 40 percent but less than 60 percent impaired, limited or restricted Mobility: Walking and Moving Around Goal Status [phone]): At least 1 percent but less than 20 percent impaired, limited or restricted    Ulah Olmo 10/15/2014, 4:36 PM Pager [phone]

## 2014-10-15 NOTE — Discharge Summary (Deleted)
[facility] PROGRESS NOTE  Kimberly Gregory ZOX:[id number] DOB: 05/17/1935 DOA: 10/14/2014 PCP: No primary care provider on file.  Assessment/Plan: Principal Problem:   Severe dizziness Active Problems:   UTI (lower urinary tract infection)   Benign essential HTN   Hyperlipidemia    Acute vertigo, likely secondary to labyrinthitis vs BPPV  No central etiology, MRI negative for stroke Continue when necessary meclizine and started the patient on scopolamine patch Check orthostatics Hydrate patient with IV fluids, she received fluid boluses in the ER yesterday No underlying signs of infection PT/OT eval, may qualify for vestibular rehabilitation 2-D echo, carotid Doppler pending  Hypertension blood pressure soft, hold Vasotec  UTI? Will DC antibiotics, no evidence of UTI at this time   Code Status:      Code Status Orders        Start     Ordered   10/14/14 1258  Full code   Continuous     10/14/14 1257    Advance Directive Documentation        Most Recent Value   Type of Advance Directive  Living will, Healthcare Power of Attorney   Pre-existing out of facility DNR order (yellow form or pink MOST form)     "MOST" Form in Place?       Family Communication: family updated about patient's clinical progress Disposition Plan: Anticipate discharge tomorrow when symptoms are improved   Brief narrative: 79 y.o. female with a past medical history of essential hypertension, hyperlipidemia who comes to the ER with complaints of severe dizziness since she woke up this morning sometime after 5:00 a.m. Per patient, she was sleeping this morning, when she got up to go to the bathroom and felt an intense fainting sensation. She she states that she fell down, hit her head and shoulder on her right side. She denies loss of consciousness, chest pain, palpitations, diaphoresis, nausea, tinnitus. She denies pitting edema of the lower extremities, PND or orthopnea. She denies  cold-like symptoms.   She also complains of chronic dysuria and urgency. She states that this is as a result of bladder surgery suture complication from a couple years ago. She is scheduled to see GYN later this month for this problem. She is currently in no acute distress.   Consultants:  None  Procedures: Carotid Duplex (Doppler) has been completed. Preliminary findings: Right = 40-59% ICA stenosis. Left = 1-39% ICA stenosis. Vertebral artery flow is antegrade  Antibiotics: Anti-infectives    Start     Dose/Rate Route Frequency Ordered Stop   10/14/14 1345  ciprofloxacin (CIPRO) tablet 250 mg     250 mg Oral 2 times daily 10/14/14 1330           HPI/Subjective: Complains of positional vertigo, no events on telemetry  Objective: Filed Vitals:   10/14/14 2107 10/15/14 0031 10/15/14 0400 10/15/14 0739  BP: 129/54 101/37 132/51 122/52  Pulse: 64 57 64 64  Temp: 97.7 F (36.5 C) 98.4 F (36.9 C) 97.6 F (36.4 C) 98.4 F (36.9 C)  TempSrc: Oral Oral Oral Oral  Resp:    18  Height:      Weight:   60.51 kg (133 lb 6.4 oz)   SpO2: 96% 97% 93% 94%    Intake/Output Summary (Last 24 hours) at 10/15/14 1314 Last data filed at 10/15/14 0926  Gross per 24 hour  Intake     20 ml  Output   2300 ml  Net  -2280  ml    Exam:  General: No acute respiratory distress Lungs: Clear to auscultation bilaterally without wheezes or crackles Cardiovascular: Regular rate and rhythm without murmur gallop or rub normal S1 and S2 Abdomen: Nontender, nondistended, soft, bowel sounds positive, no rebound, no ascites, no appreciable mass Extremities: No significant cyanosis, clubbing, or edema bilateral lower extremities     Data Review   Micro Results Recent Results (from the past 240 hour(s))  Urine culture     Status: None   Collection Time: 10/14/14  7:45 AM  Result Value Ref Range Status   Specimen Description URINE, RANDOM  Final   Special Requests ADDED [id number]  Final    Culture NO GROWTH 1 DAY  Final   Report Status 10/15/2014 FINAL  Final    Radiology Reports Dg Chest 2 View  10/14/2014   CLINICAL DATA:  Dizziness.  Fall earlier today  EXAM: CHEST  2 VIEW  COMPARISON:  July 06, 2008  FINDINGS: There is no edema or consolidation. Heart size and pulmonary vascularity are normal. No adenopathy. No pneumothorax. No bone lesions.  IMPRESSION: No edema or consolidation.   Electronically Signed   By: Bretta Bang III M.D.   On: 10/14/2014 09:44   [name] Contrast  10/14/2014   CLINICAL DATA:  Patient complains of new onset of dizziness.  EXAM: MRI HEAD WITHOUT CONTRAST  TECHNIQUE: Multiplanar, multiecho pulse sequences of the brain and surrounding structures were obtained without intravenous contrast.  COMPARISON:  None.  FINDINGS: No evidence for acute infarction, hemorrhage, mass lesion, hydrocephalus, or extra-axial fluid. Moderate cerebral and cerebellar atrophy. Mild subcortical and periventricular T2 and FLAIR hyperintensities, likely chronic microvascular ischemic change.  Flow voids are maintained throughout the carotid, basilar, and vertebral arteries. There are no areas of chronic hemorrhage.  Partial empty sella. No tonsillar herniation. Upper cervical region unremarkable.  BILATERAL cataract extraction. Mild chronic sinus disease. Trace LEFT mastoid effusion. Extracranial soft tissues appear unremarkable.  IMPRESSION: No acute intracranial findings.  Atrophy and small vessel disease.  Partial empty sella.   Electronically Signed   By: Elsie Stain M.D.   On: 10/14/2014 09:28     CBC  Recent Labs Lab 10/14/14 0806 10/14/14 1504  WBC 5.4 6.0  HGB 12.3 11.7*  HCT 34.3* 33.5*  PLT 197 192  MCV 84.3 86.1  MCH 30.2 30.1  MCHC 35.9 34.9  RDW 15.0 15.1  LYMPHSABS 1.9  --   MONOABS 0.5  --   EOSABS 0.1  --   BASOSABS 0.0  --     Chemistries   Recent Labs Lab 10/14/14 0806 10/14/14 1504  NA 143  --   K 4.0  --   CL 111  --   CO2 25   --   GLUCOSE 87  --   BUN 25*  --   CREATININE 0.89 0.83  CALCIUM 9.1  --    ------------------------------------------------------------------------------------------------------------------ estimated creatinine clearance is 49 mL/min (by C-G formula based on Cr of 0.83). ------------------------------------------------------------------------------------------------------------------ No results for input(s): HGBA1C in the last 72 hours. ------------------------------------------------------------------------------------------------------------------ No results for input(s): CHOL, HDL, LDLCALC, TRIG, CHOLHDL, LDLDIRECT in the last 72 hours. ------------------------------------------------------------------------------------------------------------------ No results for input(s): TSH, T4TOTAL, T3FREE, THYROIDAB in the last 72 hours.  Invalid input(s): FREET3 ------------------------------------------------------------------------------------------------------------------ No results for input(s): VITAMINB12, FOLATE, FERRITIN, TIBC, IRON, RETICCTPCT in the last 72 hours.  Coagulation profile No results for input(s): INR, PROTIME in the last 168 hours.  No results for input(s): DDIMER in the last 72  hours.  Cardiac Enzymes  Recent Labs Lab 10/14/14 0806 10/14/14 1504 10/14/14 1934  TROPONINI <0.03 <0.03 <0.03   ------------------------------------------------------------------------------------------------------------------ Invalid input(s): POCBNP   CBG: No results for input(s): GLUCAP in the last 168 hours.     Studies: Dg Chest 2 View  10/14/2014   CLINICAL DATA:  Dizziness.  Fall earlier today  EXAM: CHEST  2 VIEW  COMPARISON:  July 06, 2008  FINDINGS: There is no edema or consolidation. Heart size and pulmonary vascularity are normal. No adenopathy. No pneumothorax. No bone lesions.  IMPRESSION: No edema or consolidation.   Electronically Signed   By: Bretta Bang  III M.D.   On: 10/14/2014 09:44   [name] Contrast  10/14/2014   CLINICAL DATA:  Patient complains of new onset of dizziness.  EXAM: MRI HEAD WITHOUT CONTRAST  TECHNIQUE: Multiplanar, multiecho pulse sequences of the brain and surrounding structures were obtained without intravenous contrast.  COMPARISON:  None.  FINDINGS: No evidence for acute infarction, hemorrhage, mass lesion, hydrocephalus, or extra-axial fluid. Moderate cerebral and cerebellar atrophy. Mild subcortical and periventricular T2 and FLAIR hyperintensities, likely chronic microvascular ischemic change.  Flow voids are maintained throughout the carotid, basilar, and vertebral arteries. There are no areas of chronic hemorrhage.  Partial empty sella. No tonsillar herniation. Upper cervical region unremarkable.  BILATERAL cataract extraction. Mild chronic sinus disease. Trace LEFT mastoid effusion. Extracranial soft tissues appear unremarkable.  IMPRESSION: No acute intracranial findings.  Atrophy and small vessel disease.  Partial empty sella.   Electronically Signed   By: Elsie Stain M.D.   On: 10/14/2014 09:28      No results found for: HGBA1C Lab Results  Component Value Date   CREATININE 0.83 10/14/2014       Scheduled Meds: . aspirin EC  81 mg Oral Daily  . ciprofloxacin  250 mg Oral BID  . enalapril  5 mg Oral Daily  . enoxaparin (LOVENOX) injection  40 mg Subcutaneous Q24H  . phenazopyridine  200 mg Oral TID WC  . pravastatin  40 mg Oral q1800  . scopolamine  1 patch Transdermal Q72H   Continuous Infusions: . sodium chloride 100 mL/hr at 10/15/14 1014    Principal Problem:   Severe dizziness Active Problems:   UTI (lower urinary tract infection)   Benign essential HTN   Hyperlipidemia    Time spent: 45 minutes   [facility]  [facility] Pager [phone]. If 7PM-7AM, please contact night-coverage at [email or web address], password [facility] 10/15/2014, 1:14 PM

## 2014-10-16 DIAGNOSIS — R42 Dizziness and giddiness: Secondary | ICD-10-CM | POA: Diagnosis not present

## 2014-10-16 LAB — COMPREHENSIVE METABOLIC PANEL
ALT: 16 U/L (ref 14–54)
ANION GAP: 4 — AB (ref 5–15)
AST: 23 U/L (ref 15–41)
Albumin: 3 g/dL — ABNORMAL LOW (ref 3.5–5.0)
Alkaline Phosphatase: 40 U/L (ref 38–126)
BILIRUBIN TOTAL: 0.9 mg/dL (ref 0.3–1.2)
BUN: 12 mg/dL (ref 6–20)
CALCIUM: 8.4 mg/dL — AB (ref 8.9–10.3)
CO2: 25 mmol/L (ref 22–32)
CREATININE: 0.8 mg/dL (ref 0.44–1.00)
Chloride: 112 mmol/L — ABNORMAL HIGH (ref 101–111)
Glucose, Bld: 80 mg/dL (ref 65–99)
Potassium: 3.5 mmol/L (ref 3.5–5.1)
Sodium: 141 mmol/L (ref 135–145)
Total Protein: 5.7 g/dL — ABNORMAL LOW (ref 6.5–8.1)

## 2014-10-16 LAB — CBC
HCT: 31 % — ABNORMAL LOW (ref 36.0–46.0)
HEMOGLOBIN: 10.7 g/dL — AB (ref 12.0–15.0)
MCH: 29.3 pg (ref 26.0–34.0)
MCHC: 34.5 g/dL (ref 30.0–36.0)
MCV: 84.9 fL (ref 78.0–100.0)
PLATELETS: 188 10*3/uL (ref 150–400)
RBC: 3.65 MIL/uL — ABNORMAL LOW (ref 3.87–5.11)
RDW: 15 % (ref 11.5–15.5)
WBC: 4.6 10*3/uL (ref 4.0–10.5)

## 2014-10-16 MED ORDER — POLYETHYLENE GLYCOL 3350 17 G PO PACK
17.0000 g | PACK | Freq: Every day | ORAL | Status: DC
  Administered 2014-10-16 – 2014-10-17 (×2): 17 g via ORAL
  Filled 2014-10-16 (×2): qty 1

## 2014-10-16 MED ORDER — MECLIZINE HCL 25 MG PO TABS
25.0000 mg | ORAL_TABLET | Freq: Three times a day (TID) | ORAL | Status: DC | PRN
  Administered 2014-10-16 – 2014-10-17 (×2): 25 mg via ORAL
  Filled 2014-10-16 (×2): qty 1

## 2014-10-16 NOTE — Progress Notes (Signed)
[facility] PROGRESS NOTE  Kimberly Gregory ZOX:[id number] DOB: 1936-03-01 DOA: 10/14/2014 PCP: No primary care provider on file.  Assessment/Plan: Principal Problem:   Severe dizziness Active Problems:   UTI (lower urinary tract infection)   Benign essential HTN   Hyperlipidemia    Acute vertigo, likely secondary to labyrinthitis vs BPPV  No central etiology, MRI negative for stroke  restart meclizine and  Continue scopolamine patch Check orthostatics  status post  Hydration with IV fluids No underlying signs of infection PT/OT eval, recommend  Home health vestibular rehabilitation 2-D echo  Shows EF of 55-60% , no regional wall motion abnormalities,   carotid Doppler Right = 40-59% ICA stenosis. Left = 1-39% ICA stenosis  anticipate discharge tomorrow as the patient is still symptomatic  Hypertension blood pressure soft, hold Vasotec  UTI? Will DC antibiotics, no evidence of UTI at this time   Code Status:      Code Status Orders        Start     Ordered   10/14/14 1258  Full code   Continuous     10/14/14 1257    Advance Directive Documentation        Most Recent Value   Type of Advance Directive  Living will, Healthcare Power of Attorney   Pre-existing out of facility DNR order (yellow form or pink MOST form)     "MOST" Form in Place?       Family Communication: family updated about patient's clinical progress Disposition Plan: Anticipate discharge tomorrow when symptoms are improved   Brief narrative: 79 y.o. female with a past medical history of essential hypertension, hyperlipidemia who comes to the ER with complaints of severe dizziness since she woke up this morning sometime after 5:00 a.m. Per patient, she was sleeping this morning, when she got up to go to the bathroom and felt an intense fainting sensation. She she states that she fell down, hit her head and shoulder on her right side. She denies loss of consciousness, chest pain,  palpitations, diaphoresis, nausea, tinnitus. She denies pitting edema of the lower extremities, PND or orthopnea. She denies cold-like symptoms.   She also complains of chronic dysuria and urgency. She states that this is as a result of bladder surgery suture complication from a couple years ago. She is scheduled to see GYN later this month for this problem. She is currently in no acute distress.   Consultants:  None  Procedures: Carotid Duplex (Doppler) has been completed. Preliminary findings: Right = 40-59% ICA stenosis. Left = 1-39% ICA stenosis. Vertebral artery flow is antegrade  Antibiotics: Anti-infectives    Start     Dose/Rate Route Frequency Ordered Stop   10/14/14 1345  ciprofloxacin (CIPRO) tablet 250 mg  Status:  Discontinued     250 mg Oral 2 times daily 10/14/14 1330 10/15/14 1317         HPI/Subjective:  patient still complains of a significant amount of positional vertigo  Objective: Filed Vitals:   10/15/14 0400 10/15/14 0739 10/15/14 2014 10/16/14 0517  BP: 132/51 122/52 117/47 139/52  Pulse: 64 64 64 59  Temp: 97.6 F (36.4 C) 98.4 F (36.9 C) 99 F (37.2 C) 98.4 F (36.9 C)  TempSrc: Oral Oral Oral Oral  Resp:  18 18   Height:      Weight: 60.51 kg (133 lb 6.4 oz)     SpO2: 93% 94% 95% 96%    Intake/Output Summary (Last 24 hours)  at 10/16/14 1159 Last data filed at 10/16/14 1149  Gross per 24 hour  Intake 2621.67 ml  Output   2250 ml  Net 371.67 ml    Exam:  General: No acute respiratory distress Lungs: Clear to auscultation bilaterally without wheezes or crackles Cardiovascular: Regular rate and rhythm without murmur gallop or rub normal S1 and S2 Abdomen: Nontender, nondistended, soft, bowel sounds positive, no rebound, no ascites, no appreciable mass Extremities: No significant cyanosis, clubbing, or edema bilateral lower extremities     Data Review   Micro Results Recent Results (from the past 240 hour(s))  Urine culture      Status: None   Collection Time: 10/14/14  7:45 AM  Result Value Ref Range Status   Specimen Description URINE, RANDOM  Final   Special Requests ADDED [id number]  Final   Culture NO GROWTH 1 DAY  Final   Report Status 10/15/2014 FINAL  Final    Radiology Reports Dg Chest 2 View  10/14/2014   CLINICAL DATA:  Dizziness.  Fall earlier today  EXAM: CHEST  2 VIEW  COMPARISON:  July 06, 2008  FINDINGS: There is no edema or consolidation. Heart size and pulmonary vascularity are normal. No adenopathy. No pneumothorax. No bone lesions.  IMPRESSION: No edema or consolidation.   Electronically Signed   By: Bretta Bang III M.D.   On: 10/14/2014 09:44   [name] Contrast  10/14/2014   CLINICAL DATA:  Patient complains of new onset of dizziness.  EXAM: MRI HEAD WITHOUT CONTRAST  TECHNIQUE: Multiplanar, multiecho pulse sequences of the brain and surrounding structures were obtained without intravenous contrast.  COMPARISON:  None.  FINDINGS: No evidence for acute infarction, hemorrhage, mass lesion, hydrocephalus, or extra-axial fluid. Moderate cerebral and cerebellar atrophy. Mild subcortical and periventricular T2 and FLAIR hyperintensities, likely chronic microvascular ischemic change.  Flow voids are maintained throughout the carotid, basilar, and vertebral arteries. There are no areas of chronic hemorrhage.  Partial empty sella. No tonsillar herniation. Upper cervical region unremarkable.  BILATERAL cataract extraction. Mild chronic sinus disease. Trace LEFT mastoid effusion. Extracranial soft tissues appear unremarkable.  IMPRESSION: No acute intracranial findings.  Atrophy and small vessel disease.  Partial empty sella.   Electronically Signed   By: Elsie Stain M.D.   On: 10/14/2014 09:28     CBC  Recent Labs Lab 10/14/14 0806 10/14/14 1504 10/16/14 0420  WBC 5.4 6.0 4.6  HGB 12.3 11.7* 10.7*  HCT 34.3* 33.5* 31.0*  PLT 197 192 188  MCV 84.3 86.1 84.9  MCH 30.2 30.1 29.3  MCHC  35.9 34.9 34.5  RDW 15.0 15.1 15.0  LYMPHSABS 1.9  --   --   MONOABS 0.5  --   --   EOSABS 0.1  --   --   BASOSABS 0.0  --   --     Chemistries   Recent Labs Lab 10/14/14 0806 10/14/14 1504 10/16/14 0420  NA 143  --  141  K 4.0  --  3.5  CL 111  --  112*  CO2 25  --  25  GLUCOSE 87  --  80  BUN 25*  --  12  CREATININE 0.89 0.83 0.80  CALCIUM 9.1  --  8.4*  AST  --   --  23  ALT  --   --  16  ALKPHOS  --   --  40  BILITOT  --   --  0.9   ------------------------------------------------------------------------------------------------------------------ estimated creatinine clearance is  50.9 mL/min (by C-G formula based on Cr of 0.8). ------------------------------------------------------------------------------------------------------------------ No results for input(s): HGBA1C in the last 72 hours. ------------------------------------------------------------------------------------------------------------------ No results for input(s): CHOL, HDL, LDLCALC, TRIG, CHOLHDL, LDLDIRECT in the last 72 hours. ------------------------------------------------------------------------------------------------------------------ No results for input(s): TSH, T4TOTAL, T3FREE, THYROIDAB in the last 72 hours.  Invalid input(s): FREET3 ------------------------------------------------------------------------------------------------------------------ No results for input(s): VITAMINB12, FOLATE, FERRITIN, TIBC, IRON, RETICCTPCT in the last 72 hours.  Coagulation profile No results for input(s): INR, PROTIME in the last 168 hours.  No results for input(s): DDIMER in the last 72 hours.  Cardiac Enzymes  Recent Labs Lab 10/14/14 0806 10/14/14 1504 10/14/14 1934  TROPONINI <0.03 <0.03 <0.03   ------------------------------------------------------------------------------------------------------------------ Invalid input(s): POCBNP   CBG:  Recent Labs Lab 10/15/14 1621  GLUCAP 83        Studies: No results found.    No results found for: HGBA1C Lab Results  Component Value Date   CREATININE 0.80 10/16/2014       Scheduled Meds: . aspirin EC  81 mg Oral Daily  . enoxaparin (LOVENOX) injection  40 mg Subcutaneous Q24H  . phenazopyridine  200 mg Oral TID WC  . pravastatin  40 mg Oral q1800  . scopolamine  1 patch Transdermal Q72H   Continuous Infusions: . sodium chloride 100 mL/hr at 10/15/14 2205    Principal Problem:   Severe dizziness Active Problems:   UTI (lower urinary tract infection)   Benign essential HTN   Hyperlipidemia    Time spent: 45 minutes   [facility]  [facility] Pager [phone]. If 7PM-7AM, please contact night-coverage at [email or web address], password [email or web address] 10/16/2014, 11:59 AM

## 2014-10-16 NOTE — Progress Notes (Signed)
[facility] PROGRESS NOTE  Kimberly Gregory ZOX:[id number] DOB: Jul 29, 1935 DOA: 10/14/2014 PCP: No primary care provider on file.  Assessment/Plan: Principal Problem:   Severe dizziness Active Problems:   UTI (lower urinary tract infection)   Benign essential HTN   Hyperlipidemia    Acute vertigo, likely secondary to labyrinthitis vs BPPV  No central etiology, MRI negative for stroke Continue when necessary meclizine and started the patient on scopolamine patch Check orthostatics Hydrate patient with IV fluids, she received fluid boluses in the ER yesterday No underlying signs of infection PT/OT eval, may qualify for vestibular rehabilitation 2-D echo, carotid Doppler pending  Hypertension blood pressure soft, hold Vasotec  UTI? Will DC antibiotics, no evidence of UTI at this time   Code Status:      Code Status Orders        Start     Ordered   10/14/14 1258  Full code   Continuous     10/14/14 1257    Advance Directive Documentation        Most Recent Value   Type of Advance Directive  Living will, Healthcare Power of Attorney   Pre-existing out of facility DNR order (yellow form or pink MOST form)     "MOST" Form in Place?       Family Communication: family updated about patient's clinical progress Disposition Plan: Anticipate discharge tomorrow when symptoms are improved   Brief narrative: 79 y.o. female with a past medical history of essential hypertension, hyperlipidemia who comes to the ER with complaints of severe dizziness since she woke up this morning sometime after 5:00 a.m. Per patient, she was sleeping this morning, when she got up to go to the bathroom and felt an intense fainting sensation. She she states that she fell down, hit her head and shoulder on her right side. She denies loss of consciousness, chest pain, palpitations, diaphoresis, nausea, tinnitus. She denies pitting edema of the lower extremities, PND or orthopnea. She denies  cold-like symptoms.   She also complains of chronic dysuria and urgency. She states that this is as a result of bladder surgery suture complication from a couple years ago. She is scheduled to see GYN later this month for this problem. She is currently in no acute distress.   Consultants:  None  Procedures: Carotid Duplex (Doppler) has been completed. Preliminary findings: Right = 40-59% ICA stenosis. Left = 1-39% ICA stenosis. Vertebral artery flow is antegrade  Antibiotics: Anti-infectives    Start     Dose/Rate Route Frequency Ordered Stop   10/14/14 1345  ciprofloxacin (CIPRO) tablet 250 mg  Status:  Discontinued     250 mg Oral 2 times daily 10/14/14 1330 10/15/14 1317         HPI/Subjective: Complains of positional vertigo, no events on telemetry  Objective: Filed Vitals:   10/15/14 0400 10/15/14 0739 10/15/14 2014 10/16/14 0517  BP: 132/51 122/52 117/47 139/52  Pulse: 64 64 64 59  Temp: 97.6 F (36.4 C) 98.4 F (36.9 C) 99 F (37.2 C) 98.4 F (36.9 C)  TempSrc: Oral Oral Oral Oral  Resp:  18 18   Height:      Weight: 60.51 kg (133 lb 6.4 oz)     SpO2: 93% 94% 95% 96%    Intake/Output Summary (Last 24 hours) at 10/16/14 1159 Last data filed at 10/16/14 1149  Gross per 24 hour  Intake 2621.67 ml  Output   2250 ml  Net 371.67 ml  Exam:  General: No acute respiratory distress Lungs: Clear to auscultation bilaterally without wheezes or crackles Cardiovascular: Regular rate and rhythm without murmur gallop or rub normal S1 and S2 Abdomen: Nontender, nondistended, soft, bowel sounds positive, no rebound, no ascites, no appreciable mass Extremities: No significant cyanosis, clubbing, or edema bilateral lower extremities     Data Review   Micro Results Recent Results (from the past 240 hour(s))  Urine culture     Status: None   Collection Time: 10/14/14  7:45 AM  Result Value Ref Range Status   Specimen Description URINE, RANDOM  Final    Special Requests ADDED [id number]  Final   Culture NO GROWTH 1 DAY  Final   Report Status 10/15/2014 FINAL  Final    Radiology Reports Dg Chest 2 View  10/14/2014   CLINICAL DATA:  Dizziness.  Fall earlier today  EXAM: CHEST  2 VIEW  COMPARISON:  July 06, 2008  FINDINGS: There is no edema or consolidation. Heart size and pulmonary vascularity are normal. No adenopathy. No pneumothorax. No bone lesions.  IMPRESSION: No edema or consolidation.   Electronically Signed   By: Bretta Bang III M.D.   On: 10/14/2014 09:44   [name] Contrast  10/14/2014   CLINICAL DATA:  Patient complains of new onset of dizziness.  EXAM: MRI HEAD WITHOUT CONTRAST  TECHNIQUE: Multiplanar, multiecho pulse sequences of the brain and surrounding structures were obtained without intravenous contrast.  COMPARISON:  None.  FINDINGS: No evidence for acute infarction, hemorrhage, mass lesion, hydrocephalus, or extra-axial fluid. Moderate cerebral and cerebellar atrophy. Mild subcortical and periventricular T2 and FLAIR hyperintensities, likely chronic microvascular ischemic change.  Flow voids are maintained throughout the carotid, basilar, and vertebral arteries. There are no areas of chronic hemorrhage.  Partial empty sella. No tonsillar herniation. Upper cervical region unremarkable.  BILATERAL cataract extraction. Mild chronic sinus disease. Trace LEFT mastoid effusion. Extracranial soft tissues appear unremarkable.  IMPRESSION: No acute intracranial findings.  Atrophy and small vessel disease.  Partial empty sella.   Electronically Signed   By: Elsie Stain M.D.   On: 10/14/2014 09:28     CBC  Recent Labs Lab 10/14/14 0806 10/14/14 1504 10/16/14 0420  WBC 5.4 6.0 4.6  HGB 12.3 11.7* 10.7*  HCT 34.3* 33.5* 31.0*  PLT 197 192 188  MCV 84.3 86.1 84.9  MCH 30.2 30.1 29.3  MCHC 35.9 34.9 34.5  RDW 15.0 15.1 15.0  LYMPHSABS 1.9  --   --   MONOABS 0.5  --   --   EOSABS 0.1  --   --   BASOSABS 0.0  --   --      Chemistries   Recent Labs Lab 10/14/14 0806 10/14/14 1504 10/16/14 0420  NA 143  --  141  K 4.0  --  3.5  CL 111  --  112*  CO2 25  --  25  GLUCOSE 87  --  80  BUN 25*  --  12  CREATININE 0.89 0.83 0.80  CALCIUM 9.1  --  8.4*  AST  --   --  23  ALT  --   --  16  ALKPHOS  --   --  40  BILITOT  --   --  0.9   ------------------------------------------------------------------------------------------------------------------ estimated creatinine clearance is 50.9 mL/min (by C-G formula based on Cr of 0.8). ------------------------------------------------------------------------------------------------------------------ No results for input(s): HGBA1C in the last 72 hours. ------------------------------------------------------------------------------------------------------------------ No results for input(s): CHOL, HDL, LDLCALC, TRIG, CHOLHDL,  LDLDIRECT in the last 72 hours. ------------------------------------------------------------------------------------------------------------------ No results for input(s): TSH, T4TOTAL, T3FREE, THYROIDAB in the last 72 hours.  Invalid input(s): FREET3 ------------------------------------------------------------------------------------------------------------------ No results for input(s): VITAMINB12, FOLATE, FERRITIN, TIBC, IRON, RETICCTPCT in the last 72 hours.  Coagulation profile No results for input(s): INR, PROTIME in the last 168 hours.  No results for input(s): DDIMER in the last 72 hours.  Cardiac Enzymes  Recent Labs Lab 10/14/14 0806 10/14/14 1504 10/14/14 1934  TROPONINI <0.03 <0.03 <0.03   ------------------------------------------------------------------------------------------------------------------ Invalid input(s): POCBNP   CBG:  Recent Labs Lab 10/15/14 1621  GLUCAP 83       Studies: No results found.    No results found for: HGBA1C Lab Results  Component Value Date   CREATININE 0.80  10/16/2014       Scheduled Meds: . aspirin EC  81 mg Oral Daily  . enoxaparin (LOVENOX) injection  40 mg Subcutaneous Q24H  . phenazopyridine  200 mg Oral TID WC  . pravastatin  40 mg Oral q1800  . scopolamine  1 patch Transdermal Q72H   Continuous Infusions: . sodium chloride 100 mL/hr at 10/15/14 2205    Principal Problem:   Severe dizziness Active Problems:   UTI (lower urinary tract infection)   Benign essential HTN   Hyperlipidemia    Time spent: 45 minutes   [facility]  [facility] Pager [phone]. If 7PM-7AM, please contact night-coverage at [email or web address], password [facility] 10/16/2014, 11:59 AM

## 2014-10-16 NOTE — Progress Notes (Signed)
Physical Therapy Treatment Patient Details Name: Kimberly Gregory MRN: [id number] DOB: 23-Apr-1935 Today's Date: 10/16/2014    History of Present Illness 79 y.o. female adm 8/3 s/p onset of dizziness with fall (hit head landing on Rt side).  Pt with no significant PMhx.  Cardiac workup in progress. MRI negative for stroke, found to have benign essential HTN.     PT Comments    Further vestibular assessment inconclusive due to pt's vague report of symptom intensity.  I did see some right upwardly rotating nystagmus when looking to the left and sidelying to the left.  I think that left dix hallpike was more symptomatic than right, but pt was having difficulty remembering which was worse.  Francee Piccolo Daroff exercises and x1 seated VOR exercises given for HEP. PT to see tomorrow to re test posterior canals and preform Eply's if same side determined to be problematic.  Recommend HHPT with request for vestibular therapist.  Follow Up Recommendations  Home health PT;Other (comment) (request vestibular rehab through [facility])     Equipment Recommendations  None recommended by PT    Recommendations for Other Services OT consult     Precautions / Restrictions Precautions Precautions: Fall Precaution Comments: due to unstediness related to dizziness    Mobility  Bed Mobility Overal bed mobility: Needs Assistance Bed Mobility: Sidelying to Sit;Sit to Sidelying   Sidelying to sit: Min assist     Sit to sidelying: Min assist General bed mobility comments: Min assist to support trunk during transitions due to pain.   Transfers Overall transfer level: Needs assistance Equipment used: Rolling walker (2 wheeled) Transfers: Sit to/from Stand Sit to Stand: Min assist;Mod assist         General transfer comment: mod assist from low toilet, min assist from higher bed  Ambulation/Gait Ambulation/Gait assistance: Min guard Ambulation Distance (Feet): 20 Feet (x2) Assistive device: Rolling  walker (2 wheeled) Gait Pattern/deviations: Step-through pattern;Staggering left;Staggering right     General Gait Details: no major LOB during gait, but small staggers throughout.  No particular side during my session today.                    Balance Overall balance assessment: Needs assistance Sitting-balance support: No upper extremity supported Sitting balance-Leahy Scale: Good     Standing balance support: Bilateral upper extremity supported Standing balance-Leahy Scale: Poor                      Cognition Arousal/Alertness: Awake/alert Behavior During Therapy: WFL for tasks assessed/performed         Memory: Decreased short-term memory (pt having difficulty reporting things within the session)              Exercises Other Exercises Other Exercises: x1 seated horizontal and vertical head shakingexercises given as HEP.  Francee Piccolo Daroff exercises given starting on the left as HEP.  Handouts reviewed with daughter.         Pertinent Vitals/Pain Pain Assessment: Faces Faces Pain Scale: Hurts even more Pain Location: during transitional movements in right sided low back Pain Descriptors / Indicators: Burning;Grimacing Pain Intervention(s): Limited activity within patient's tolerance;Monitored during session;Repositioned       Vestibular Assessment   10/16/14 1110  Vestibular Assessment  General Observation Pt a difficult historian throughout session.  When testing left and right sides she cannot tell me repeatedly if one side is worse than the other side.   Symptom Behavior  Type of Dizziness Blurred vision  Frequency of Dizziness sitting up and standing  Duration of Dizziness <60 seconds  Aggravating Factors Sit to stand;Activity in general  Relieving Factors Lying supine;Closing eyes;Rest  Occulomotor Exam  Occulomotor Alignment Normal  Spontaneous Absent  Gaze-induced Absent  Smooth Pursuits Intact  Saccades Intact  Vestibulo-Occular  Reflex  VOR 1 Head Only (x 1 viewing) increased symptoms  Positional Testing  Dix-Hallpike Dix-Hallpike Right;Dix-Hallpike Left  Sidelying Test Sidelying Right;Sidelying Left  Horizontal Canal Testing Horizontal Canal Right;Horizontal Canal Left  Dix-Hallpike Right  Dix-Hallpike Right Duration <30  Dix-Hallpike Right Symptoms No nystagmus;Other (comment) (some reports of head feeling funny)  Dix-Hallpike Left  Dix-Hallpike Left Duration <30  Dix-Hallpike Left Symptoms No nystagmus;Other (comment) (also feels "funny" unable to say if right or left was worse)  Sidelying Right  Sidelying Right Duration none  Sidelying Right Symptoms No nystagmus  Sidelying Left  Sidelying Left Duration <30 sec  Sidelying Left Symptoms Upbeat, right rotatory nystagmus  Horizontal Canal Right  Horizontal Canal Right Duration none  Horizontal Canal Right Symptoms Normal  Horizontal Canal Left  Horizontal Canal Left Duration none  Horizontal Canal Left Symptoms Normal  Positional Sensitivities  Sit to Supine 0  Supine to Sitting 3  Right Hallpike 2  Up from Right Hallpike 3  Up from Left Hallpike 3  Head Turning x 5 3  Head Nodding x 5 2      PT Goals (current goals can now be found in the care plan section) Acute Rehab PT Goals Patient Stated Goal: to go home so that she can care for her husband.  Progress towards PT goals: Progressing toward goals    Frequency  Min 5X/week    PT Plan Current plan remains appropriate       End of Session   Activity Tolerance: Other (comment) (limited by dizziness) Patient left: in chair;with chair alarm set;with family/visitor present     Time: 1002-1057 PT Time Calculation (min) (ACUTE ONLY): 55 min  Charges:  $Therapeutic Exercise: 23-37 mins $Therapeutic Activity: 23-37 mins                       Caterin Tabares B. Zayne Marovich, PT, DPT [phone]   10/16/2014, 11:10 AM

## 2014-10-16 NOTE — Care Management Note (Addendum)
Case Management Note Donn Pierini RN, BSN Unit 2W-Case Manager [phone] Covering 3W  Patient Details  Name: Kimberly Gregory MRN: [id number] Date of Birth: 02-Sep-1935  Subjective/Objective:   Pt admitted with dizziness/vertigo               PCP- Ralene Ok  Action/Plan: PTA pt lived at home with spouse  Expected Discharge Date:      10/17/14            Expected Discharge Plan:  Home w Home Health Services  In-House Referral:     Discharge planning Services  CM Consult  Post Acute Care Choice:  Home Health Choice offered to:  Patient  DME Arranged:  N/A DME Agency:     HH Arranged:  PT, OT, Nurse's Aide HH Agency:  [facility]  Status of Service:  Completed, signed off  Medicare Important Message Given:    Date Medicare IM Given:    Medicare IM give by:    Date Additional Medicare IM Given:    Additional Medicare Important Message give by:     If discussed at Long Length of Stay Meetings, dates discussed:    Additional Comments:  10/16/14- referral for [facility]- spoke with pt at bedside- choice offered for [facility] agency- per pt she would like to use [facility] for services- referral called to Tiffany with [facility] for HH-PT/OT/aide (will need vestibular PT)- no further CM needs noted.   Darrold Span, RN 10/16/2014, 3:16 PM

## 2014-10-16 NOTE — Evaluation (Signed)
Occupational Therapy Evaluation Patient Details Name: Kimberly Gregory MRN: [id number] DOB: 10-02-1935 Today's Date: 10/16/2014    History of Present Illness 79 y.o. female adm 8/3 s/p onset of dizziness with fall (hit head landing on Rt side).  Pt with no significant PMhx.  Cardiac workup in progress. MRI negative for stroke, found to have benign essential HTN.    Clinical Impression   This 79 yo female admitted with above presents to acute OT with decreased balance, decreased mobility, dizziness with movement, decreased cognition/memory for target focusing during mobility all affecting her ability to care for herself (much less for her husband) at an Independent level as she was pta. She will benefit from acute OT with follow up HHOT.    Follow Up Recommendations  Home health OT    Equipment Recommendations  None recommended by OT       Precautions / Restrictions Precautions Precautions: Fall Precaution Comments: due to unstediness related to dizziness Restrictions Weight Bearing Restrictions: No      Mobility Bed Mobility Overal bed mobility: Needs Assistance Bed Mobility: Sit to Sidelying       Sit to sidelying: Supervision General bed mobility comments: VCs needed for reminder to focus on a target with transitional movements  Transfers Overall transfer level: Needs assistance Equipment used: Rolling walker (2 wheeled) Transfers: Sit to/from Stand Sit to Stand: Min guard         General transfer comment: VCs needed for reminder to focus on a target with transitional movements    Balance Overall balance assessment: Needs assistance Sitting-balance support: No upper extremity supported Sitting balance-Leahy Scale: Good     Standing balance support: Bilateral upper extremity supported Standing balance-Leahy Scale: Poor                              ADL Overall ADL's : Needs assistance/impaired Eating/Feeding: Independent;Sitting   Grooming:  Supervision/safety;Set up;Sitting;Standing   Upper Body Bathing: Supervision/ safety;Set up;Sitting   Lower Body Bathing: Supervison/ safety;Set up;Sit to/from stand   Upper Body Dressing : Supervision/safety;Set up;Sitting   Lower Body Dressing: Supervision/safety;Set up;Sit to/from stand   Toilet Transfer: Min guard;Ambulation;RW;Regular Toilet;Grab bars   Toileting- Clothing Manipulation and Hygiene: Min guard;Sit to/from stand         General ADL Comments: VCs needed thorughout our sesson to remember to focus on a target with transitional movements during all BADL activities.     Vision Additional Comments: No change from baseline          Pertinent Vitals/Pain Pain Assessment: Faces Faces Pain Scale: Hurts little more Pain Location: with transitional movements due to soreness still from fall from home Pain Descriptors / Indicators: Grimacing Pain Intervention(s): Monitored during session;Repositioned     Hand Dominance Right   Extremity/Trunk Assessment Upper Extremity Assessment Upper Extremity Assessment: Overall WFL for tasks assessed           Communication Communication Communication: No difficulties   Cognition Arousal/Alertness: Awake/alert Behavior During Therapy: WFL for tasks assessed/performed Overall Cognitive Status:  (daughter reports that pt does have some memory issues (but states pt will not admitt it). Pt did keep repeating herself while I was working with her--mainly about how her rigiht knee locks up on her.)       Memory: Decreased short-term memory (pt having difficulty reporting things within the session)  Home Living Family/patient expects to be discharged to:: Private residence Living Arrangements: Spouse/significant other (spouse with CVA and pt cares for him; per pt and daughter report he can manage from himself but is "spoiled" since his CVA 12 years ago and expects alot to be done for  him) Available Help at Discharge: Family;Available PRN/intermittently (daughter says she can be there as well) Type of Home: House             Bathroom Shower/Tub: Tub/shower unit;Curtain Shower/tub characteristics: Engineer, building services: Standard     Home Equipment: Tub bench (says she can borrow her husbands walker (that he does not use); I showed pt and daughter how to adjust it for pt's height)          Prior Functioning/Environment Level of Independence: Independent        Comments: Cares for her husband (s/p CVA)    OT Diagnosis: Generalized weakness;Cognitive deficits   OT Problem List: Decreased strength;Impaired balance (sitting and/or standing);Decreased knowledge of use of DME or AE;Decreased knowledge of precautions;Decreased cognition   OT Treatment/Interventions: Self-care/ADL training;Patient/family education;Balance training;DME and/or AE instruction;Therapeutic activities    OT Goals(Current goals can be found in the care plan section) Acute Rehab OT Goals Patient Stated Goal: to go home so that she can care for her husband.  OT Goal Formulation: With patient Time For Goal Achievement: 10/23/14 Potential to Achieve Goals: Good  OT Frequency: Min 2X/week              End of Session Equipment Utilized During Treatment: Rolling walker  Activity Tolerance: Patient tolerated treatment well Patient left: in bed;with call bell/phone within reach;with bed alarm set;with family/visitor present   Time: 1201-1247 OT Time Calculation (min): 46 min Charges:  OT General Charges $OT Visit: 1 Procedure OT Evaluation $Initial OT Evaluation Tier I: 1 Procedure OT Treatments $Self Care/Home Management : 8-22 mins $Therapeutic Activity: 8-22 mins G-Codes: OT G-codes **NOT FOR INPATIENT CLASS** Functional Assessment Tool Used: Clinical observation Functional Limitation: Self care Self Care Current Status (Z6109): At least 1 percent but less than 20 percent  impaired, limited or restricted Self Care Goal Status (U0454): At least 1 percent but less than 20 percent impaired, limited or restricted  Evette Georges [phone] 10/16/2014, 1:23 PM

## 2014-10-17 DIAGNOSIS — R42 Dizziness and giddiness: Secondary | ICD-10-CM | POA: Diagnosis not present

## 2014-10-17 MED ORDER — ASPIRIN 81 MG PO TBEC: 81.0000 mg | DELAYED_RELEASE_TABLET | Freq: Every day | ORAL | Status: DC

## 2014-10-17 MED ORDER — ENALAPRIL MALEATE 5 MG PO TABS: 5.0000 mg | ORAL_TABLET | Freq: Every day | ORAL | Status: DC

## 2014-10-17 MED ORDER — SCOPOLAMINE 1 MG/3DAYS TD PT72: 1.0000 | MEDICATED_PATCH | TRANSDERMAL | Status: DC

## 2014-10-17 MED ORDER — DOCUSATE SODIUM 100 MG PO CAPS: 100.0000 mg | ORAL_CAPSULE | Freq: Two times a day (BID) | ORAL | Status: DC | PRN

## 2014-10-17 MED ORDER — MECLIZINE HCL 25 MG PO TABS: 25.0000 mg | ORAL_TABLET | Freq: Three times a day (TID) | ORAL | Status: DC | PRN

## 2014-10-17 NOTE — Discharge Instructions (Signed)
Dizziness  Dizziness means you feel unsteady or lightheaded. You might feel like you are going to pass out (faint). HOME CARE   Drink enough fluids to keep your pee (urine) clear or pale yellow.  Take your medicines exactly as told by your doctor. If you take blood pressure medicine, always stand up slowly from the lying or sitting position. Hold on to something to steady yourself.  If you need to stand in one place for a long time, move your legs often. Tighten and relax your leg muscles.  Have someone stay with you until you feel okay.  Do not drive or use heavy machinery if you feel dizzy.  Do not drink alcohol. GET HELP RIGHT AWAY IF:   You feel dizzy or lightheaded and it gets worse.  You feel sick to your stomach (nauseous), or you throw up (vomit).  You have trouble talking or walking.  You feel weak or have trouble using your arms, hands, or legs.  You cannot think clearly or have trouble forming sentences.  You have chest pain, belly (abdominal) pain, sweating, or you are short of breath.  Your vision changes.  You are bleeding.  You have problems from your medicine that seem to be getting worse. MAKE SURE YOU:   Understand these instructions.  Will watch your condition.  Will get help right away if you are not doing well or get worse. Document Released: 02/16/2011 Document Revised: 05/22/2011 Document Reviewed: 02/16/2011 [facility] Patient Information 2015 [place], [place]. This information is not intended to replace advice given to you by your health care provider. Make sure you discuss any questions you have with your health care provider.   Cardiac Diet This diet can help prevent heart disease and stroke. Many factors influence your heart health, including eating and exercise habits. Coronary risk rises a lot with abnormal blood fat (lipid) levels. Cardiac meal planning includes limiting unhealthy fats, increasing healthy fats, and making other small dietary  changes. General guidelines are as follows:  Adjust calorie intake to reach and maintain desirable body weight.  Limit total fat intake to less than 30% of total calories. Saturated fat should be less than 7% of calories.  Saturated fats are found in animal products and in some vegetable products. Saturated vegetable fats are found in coconut oil, cocoa butter, palm oil, and palm kernel oil. Read labels carefully to avoid these products as much as possible. Use butter in moderation. Choose tub margarines and oils that have 2 grams of fat or less. Good cooking oils are canola and olive oils.  Practice low-fat cooking techniques. Do not fry food. Instead, broil, bake, boil, steam, grill, roast on a rack, stir-fry, or microwave it. Other fat reducing suggestions include:  Remove the skin from poultry.  Remove all visible fat from meats.  Skim the fat off stews, soups, and gravies before serving them.  Steam vegetables in water or broth instead of sauting them in fat.  Avoid foods with trans fat (or hydrogenated oils), such as commercially fried foods and commercially baked goods. Commercial shortening and deep-frying fats will contain trans fat.  Increase intake of fruits, vegetables, whole grains, and legumes to replace foods high in fat.  Increase consumption of nuts, legumes, and seeds to at least 4 servings weekly. One serving of a legume equals  cup, and 1 serving of nuts or seeds equals  cup.  Choose whole grains more often. Have 3 servings per day (a serving is 1 ounce [oz]).  Eat  4 to 5 servings of vegetables per day. A serving of vegetables is 1 cup of raw leafy vegetables;  cup of raw or cooked cut-up vegetables;  cup of vegetable juice.  Eat 4 to 5 servings of fruit per day. A serving of fruit is 1 medium whole fruit;  cup of dried fruit;  cup of fresh, frozen, or canned fruit;  cup of 100% fruit juice.  Increase your intake of dietary fiber to 20 to 30 grams per day.  Insoluble fiber may help lower your risk of heart disease and may help curb your appetite.  Soluble fiber binds cholesterol to be removed from the blood. Foods high in soluble fiber are dried beans, citrus fruits, oats, apples, bananas, broccoli, Brussels sprouts, and eggplant.  Try to include foods fortified with plant sterols or stanols, such as yogurt, breads, juices, or margarines. Choose several fortified foods to achieve a daily intake of 2 to 3 grams of plant sterols or stanols.  Foods with omega-3 fats can help reduce your risk of heart disease. Aim to have a 3.5 oz portion of fatty fish twice per week, such as salmon, mackerel, albacore tuna, sardines, lake trout, or herring. If you wish to take a fish oil supplement, choose one that contains 1 gram of both DHA and EPA.  Limit processed meats to 2 servings (3 oz portion) weekly.  Limit the sodium in your diet to 1500 milligrams (mg) per day. If you have high blood pressure, talk to a registered dietitian about a DASH (Dietary Approaches to Stop Hypertension) eating plan.  Limit sweets and beverages with added sugar, such as soda, to no more than 5 servings per week. One serving is:   1 tablespoon sugar.  1 tablespoon jelly or jam.   cup sorbet.  1 cup lemonade.   cup regular soda. CHOOSING FOODS Starches  Allowed: Breads: All kinds (wheat, rye, raisin, white, oatmeal, [place], [place], and English muffin bread). Low-fat rolls: English muffins, frankfurter and hamburger buns, bagels, pita bread, tortillas (not fried). Pancakes, waffles, biscuits, and muffins made with recommended oil.  Avoid: Products made with saturated or trans fats, oils, or whole milk products. Butter rolls, cheese breads, croissants. Commercial doughnuts, muffins, sweet rolls, biscuits, waffles, pancakes, store-bought mixes. Crackers  Allowed: Low-fat crackers and snacks: Animal, graham, rye, saltine (with recommended oil, no lard), oyster, and matzo  crackers. Bread sticks, melba toast, rusks, flatbread, pretzels, and light popcorn.  Avoid: High-fat crackers: cheese crackers, butter crackers, and those made with coconut, palm oil, or trans fat (hydrogenated oils). Buttered popcorn. Cereals  Allowed: Hot or cold whole-grain cereals.  Avoid: Cereals containing coconut, hydrogenated vegetable fat, or animal fat. Potatoes / Pasta / Rice  Allowed: All kinds of potatoes, rice, and pasta (such as macaroni, spaghetti, and noodles).  Avoid: Pasta or rice prepared with cream sauce or high-fat cheese. Chow mein noodles, [place] fries. Vegetables  Allowed: All vegetables and vegetable juices.  Avoid: Fried vegetables. Vegetables in cream, butter, or high-fat cheese sauces. Limit coconut. Fruit in cream or custard. Protein  Allowed: Limit your intake of meat, seafood, and poultry to no more than 6 oz (cooked weight) per day. All lean, well-trimmed beef, veal, pork, and lamb. All chicken and [place] without skin. All fish and shellfish. Wild game: wild duck, rabbit, pheasant, and venison. Egg whites or low-cholesterol egg substitutes may be used as desired. Meatless dishes: recipes with dried beans, peas, lentils, and tofu (soybean curd). Seeds and nuts: all seeds and most nuts.  Avoid: Prime grade and other heavily marbled and fatty meats, such as short ribs, spare ribs, rib eye roast or steak, frankfurters, sausage, bacon, and high-fat luncheon meats, mutton. Caviar. Commercially fried fish. Domestic duck, goose, venison sausage. Organ meats: liver, gizzard, heart, chitterlings, brains, kidney, sweetbreads. Dairy  Allowed: Low-fat cheeses: nonfat or low-fat cottage cheese (1% or 2% fat), cheeses made with part skim milk, such as mozzarella, farmers, string, or ricotta. (Cheeses should be labeled no more than 2 to 6 grams fat per oz.). Skim (or 1%) milk: liquid, powdered, or evaporated. Buttermilk made with low-fat milk. Drinks made with skim or  low-fat milk or cocoa. Chocolate milk or cocoa made with skim or low-fat (1%) milk. Nonfat or low-fat yogurt.  Avoid: Whole milk cheeses, including colby, cheddar, [place], [address], [place], [place], [place], [address], [place], and blue. Creamed cottage cheese, cream cheese. Whole milk and whole milk products, including buttermilk or yogurt made from whole milk, drinks made from whole milk. Condensed milk, evaporated whole milk, and 2% milk. Soups and Combination Foods  Allowed: Low-fat low-sodium soups: broth, dehydrated soups, homemade broth, soups with the fat removed, homemade cream soups made with skim or low-fat milk. Low-fat spaghetti, lasagna, chili, and Spanish rice if low-fat ingredients and low-fat cooking techniques are used.  Avoid: Cream soups made with whole milk, cream, or high-fat cheese. All other soups. Desserts and Sweets  Allowed: Sherbet, fruit ices, gelatins, meringues, and angel food cake. Homemade desserts with recommended fats, oils, and milk products. Jam, jelly, honey, marmalade, sugars, and syrups. Pure sugar candy, such as gum drops, hard candy, jelly beans, marshmallows, mints, and small amounts of dark chocolate.  Avoid: Commercially prepared cakes, pies, cookies, frosting, pudding, or mixes for these products. Desserts containing whole milk products, chocolate, coconut, lard, palm oil, or palm kernel oil. Ice cream or ice cream drinks. Candy that contains chocolate, coconut, butter, hydrogenated fat, or unknown ingredients. Buttered syrups. Fats and Oils  Allowed: Vegetable oils: safflower, sunflower, corn, soybean, cottonseed, sesame, canola, olive, or peanut. Non-hydrogenated margarines. Salad dressing or mayonnaise: homemade or commercial, made with a recommended oil. Low or nonfat salad dressing or mayonnaise.  Limit added fats and oils to 6 to 8 tsp per day (includes fats used in cooking, baking, salads, and spreads on bread). Remember to count the "hidden  fats" in foods.  Avoid: Solid fats and shortenings: butter, lard, salt pork, bacon drippings. Gravy containing meat fat, shortening, or suet. Cocoa butter, coconut. Coconut oil, palm oil, palm kernel oil, or hydrogenated oils: these ingredients are often used in bakery products, nondairy creamers, whipped toppings, candy, and commercially fried foods. Read labels carefully. Salad dressings made of unknown oils, sour cream, or cheese, such as blue cheese and Roquefort. Cream, all kinds: half-and-half, light, heavy, or whipping. Sour cream or cream cheese (even if "light" or low-fat). Nondairy cream substitutes: coffee creamers and sour cream substitutes made with palm, palm kernel, hydrogenated oils, or coconut oil. Beverages  Allowed: Coffee (regular or decaffeinated), tea. Diet carbonated beverages, mineral water. Alcohol: Check with your caregiver. Moderation is recommended.  Avoid: Whole milk, regular sodas, and juice drinks with added sugar. Condiments  Allowed: All seasonings and condiments. Cocoa powder. "Cream" sauces made with recommended ingredients.  Avoid: Carob powder made with hydrogenated fats. SAMPLE MENU Breakfast   cup orange juice   cup oatmeal  1 slice toast  1 tsp margarine  1 cup skim milk Lunch  [place] sandwich with 2 oz [place], 2 slices bread  Lettuce and tomato slices  Fresh fruit  Carrot sticks  Coffee or tea Snack  Fresh fruit or low-fat crackers Dinner  3 oz lean ground beef  1 baked potato  1 tsp margarine   cup asparagus  Lettuce salad  1 tbs non-creamy dressing   cup peach slices  1 cup skim milk Document Released: 12/07/2007 Document Revised: 08/29/2011 Document Reviewed: 04/29/2013 [employer] Patient Information 2015 [place], [place]. This information is not intended to replace advice given to you by your health care provider. Make sure you discuss any questions you have with your health care provider.

## 2014-10-17 NOTE — Discharge Summary (Signed)
Physician Discharge Summary  Kimberly Gregory MRN: [id number] DOB/AGE: 05/04/1935 79 y.o.  PCP: No primary care provider on file.   Admit date: 10/14/2014 Discharge date: 10/17/2014  Discharge Diagnoses:     Principal Problem:   Severe dizziness Active Problems:   UTI (lower urinary tract infection)   Benign essential HTN   Hyperlipidemia    Follow-up recommendations Follow-up with PCP in 3-5 days , including although additional recommended appointments as below Follow-up CBC, CMP in 3-5 days Home health PT;Supervision for mobility/OOB (vestibular rehab) Patient would need ENT follow-up if no resolution of symptoms in one week    Medication List    TAKE these medications        ALPRAZolam 0.5 MG tablet  Commonly known as:  XANAX  Take 0.5 mg by mouth 3 (three) times daily as needed. For anxiety     aspirin 81 MG EC tablet  Take 1 tablet (81 mg total) by mouth daily.     docusate sodium 100 MG capsule  Commonly known as:  COLACE  Take 1 capsule (100 mg total) by mouth 2 (two) times daily as needed for mild constipation.     enalapril 5 MG tablet  Commonly known as:  VASOTEC  Take 5 mg by mouth daily.     lovastatin 40 MG tablet  Commonly known as:  MEVACOR  Take 40 mg by mouth daily.     meclizine 25 MG tablet  Commonly known as:  ANTIVERT  Take 1 tablet (25 mg total) by mouth 3 (three) times daily as needed for dizziness.     scopolamine 1 MG/3DAYS  Commonly known as:  TRANSDERM-SCOP  Place 1 patch (1.5 mg total) onto the skin every 3 (three) days.         Discharge Condition: *Stable  Disposition:    Consults:    Significant Diagnostic Studies:  Dg Chest 2 View  10/14/2014   CLINICAL DATA:  Dizziness.  Fall earlier today  EXAM: CHEST  2 VIEW  COMPARISON:  July 06, 2008  FINDINGS: There is no edema or consolidation. Heart size and pulmonary vascularity are normal. No adenopathy. No pneumothorax. No bone lesions.  IMPRESSION: No edema or  consolidation.   Electronically Signed   By: William  Woodruff III M.D.   On: 10/14/2014 09:44   [name] Contrast  10/14/2014   CLINICAL DATA:  Patient complains of new onset of dizziness.  EXAM: MRI HEAD WITHOUT CONTRAST  TECHNIQUE: Multiplanar, multiecho pulse sequences of the brain and surrounding structures were obtained without intravenous contrast.  COMPARISON:  None.  FINDINGS: No evidence for acute infarction, hemorrhage, mass lesion, hydrocephalus, or extra-axial fluid. Moderate cerebral and cerebellar atrophy. Mild subcortical and periventricular T2 and FLAIR hyperintensities, likely chronic microvascular ischemic change.  Flow voids are maintained throughout the carotid, basilar, and vertebral arteries. There are no areas of chronic hemorrhage.  Partial empty sella. No tonsillar herniation. Upper cervical region unremarkable.  BILATERAL cataract extraction. Mild chronic sinus disease. Trace LEFT mastoid effusion. Extracranial soft tissues appear unremarkable.  IMPRESSION: No acute intracranial findings.  Atrophy and small vessel disease.  Partial empty sella.   Electronically Signed   By: John T Curnes M.D.   On: 10/14/2014 09:28    2-D echo LV EF: 55% -  60%  ------------------------------------------------------------------- Indications:   CAD of native vessels 414.01.  ------------------------------------------------------------------- History:  Risk factors: Current tobacco use. Hypertension. Dyslipidemia.  ------------------------------------------------------------------- Study Conclusions  - Left ventricle: The cavity size was normal. Systolic   function was normal. The estimated ejection fraction was in the range of 55% to 60%. Wall motion was normal; there were no regional wall motion abnormalities. Left ventricular diastolic function parameters were normal. - Atrial septum: No defect or patent foramen ovale was identified. - Tricuspid valve: There was  mild-moderate regurgitation directed centrally.    Filed Weights   10/14/14 1252 10/15/14 0400 10/17/14 0423  Weight: 59.829 kg (131 lb 14.4 oz) 60.51 kg (133 lb 6.4 oz) 60.963 kg (134 lb 6.4 oz)     Microbiology: Recent Results (from the past 240 hour(s))  Urine culture     Status: None   Collection Time: 10/14/14  7:45 AM  Result Value Ref Range Status   Specimen Description URINE, RANDOM  Final   Special Requests ADDED [id number]  Final   Culture NO GROWTH 1 DAY  Final   Report Status 10/15/2014 FINAL  Final       Blood Culture    Component Value Date/Time   SDES URINE, RANDOM 10/14/2014 0745   SPECREQUEST ADDED [id number] 10/14/2014 0745   CULT NO GROWTH 1 DAY 10/14/2014 0745   REPTSTATUS 10/15/2014 FINAL 10/14/2014 0745      Labs: Results for orders placed or performed during the hospital encounter of 10/14/14 (from the past 48 hour(s))  Glucose, capillary     Status: None   Collection Time: 10/15/14  4:21 PM  Result Value Ref Range   Glucose-Capillary 83 65 - 99 mg/dL  Comprehensive metabolic panel     Status: Abnormal   Collection Time: 10/16/14  4:20 AM  Result Value Ref Range   Sodium 141 135 - 145 mmol/L   Potassium 3.5 3.5 - 5.1 mmol/L   Chloride 112 (H) 101 - 111 mmol/L   CO2 25 22 - 32 mmol/L   Glucose, Bld 80 65 - 99 mg/dL   BUN 12 6 - 20 mg/dL   Creatinine, Ser 0.80 0.44 - 1.00 mg/dL   Calcium 8.4 (L) 8.9 - 10.3 mg/dL   Total Protein 5.7 (L) 6.5 - 8.1 g/dL   Albumin 3.0 (L) 3.5 - 5.0 g/dL   AST 23 15 - 41 U/L   ALT 16 14 - 54 U/L   Alkaline Phosphatase 40 38 - 126 U/L   Total Bilirubin 0.9 0.3 - 1.2 mg/dL   GFR calc non Af Amer >60 >60 mL/min   GFR calc Af Amer >60 >60 mL/min    Comment: (NOTE) The eGFR has been calculated using the CKD EPI equation. This calculation has not been validated in all clinical situations. eGFR's persistently <60 mL/min signify possible Chronic Kidney Disease.    Anion gap 4 (L) 5 - 15  CBC     Status:  Abnormal   Collection Time: 10/16/14  4:20 AM  Result Value Ref Range   WBC 4.6 4.0 - 10.5 K/uL   RBC 3.65 (L) 3.87 - 5.11 MIL/uL   Hemoglobin 10.7 (L) 12.0 - 15.0 g/dL   HCT 31.0 (L) 36.0 - 46.0 %   MCV 84.9 78.0 - 100.0 fL   MCH 29.3 26.0 - 34.0 pg   MCHC 34.5 30.0 - 36.0 g/dL   RDW 15.0 11.5 - 15.5 %   Platelets 188 150 - 400 K/uL     Lipid Panel  No results found for: CHOL, TRIG, HDL, CHOLHDL, VLDL, LDLCALC, LDLDIRECT   No results found for: HGBA1C   Lab Results  Component Value Date   CREATININE 0.80 10/16/2014       HPI 79 y.o. female with a past medical history of essential hypertension, hyperlipidemia who comes to the ER with complaints of severe dizziness since she woke up this morning sometime after 5:00 a.m. Per patient, she was sleeping this morning, when she got up to go to the bathroom and felt an intense fainting sensation. She she states that she fell down, hit her head and shoulder on her right side. She denies loss of consciousness, chest pain, palpitations, diaphoresis, nausea, tinnitus. She denies pitting edema of the lower extremities, PND or orthopnea. She denies cold-like symptoms.   She also complains of chronic dysuria and urgency. She states that this is as a result of bladder surgery suture complication from a couple years ago. She is scheduled to see GYN later this month for this problem. She is currently in no acute distress.   HOSPITAL COURSE: * Acute vertigo, likely secondary to labyrinthitis vs BPPV  No central etiology, MRI negative for stroke restart meclizine and Continue scopolamine patch Negative orthostatics status post Hydration with IV fluids No underlying signs of infection PT/OT eval, recommend Home health vestibular rehabilitation 2-D echo Shows EF of 55-60% , no regional wall motion abnormalities,  carotid Doppler Right = 40-59% ICA stenosis. Left = 1-39% ICA stenosis Patient will be discharged home with home health and a  rolling walker   Hypertension blood pressure soft, hold Vasotec until seen by PCP  UTI? Will DC antibiotics, no evidence of UTI at this time   Discharge Exam: *   Blood pressure 116/53, pulse 58, temperature 98.1 F (36.7 C), temperature source Oral, resp. rate 20, height 5' 4" (1.626 m), weight 60.963 kg (134 lb 6.4 oz), SpO2 94 %.  General: No acute respiratory distress Lungs: Clear to auscultation bilaterally without wheezes or crackles Cardiovascular: Regular rate and rhythm without murmur gallop or rub normal S1 and S2 Abdomen: Nontender, nondistended, soft, bowel sounds positive, no rebound, no ascites, no appreciable mass Extremities: No significant cyanosis, clubbing, or edema bilateral lower extremities         Follow-up Information    Follow up with [facility].   Why:  HH-PT/OT/aide arranged   Contact information:   [address] [place] [address] [phone]       Follow up with PCP. Schedule an appointment as soon as possible for a visit in 3 days.      SignedReyne Dumas 10/17/2014, 12:51 PM        Time spent >45 mins

## 2014-10-17 NOTE — Progress Notes (Signed)
Physical Therapy Treatment Patient Details Name: Kimberly Gregory MRN: [id number] DOB: 05-29-1935 Today's Date: 10/17/2014    History of Present Illness 79 y.o. female adm 8/3 s/p onset of dizziness with fall (hit head landing on Rt side).  Pt with no significant PMhx.  Cardiac workup in progress. MRI negative for stroke, found to have benign essential HTN.     PT Comments    Pt is progressing well with exercises and gait.  Using compensatory strategies.  She still needs to use RW full time for now with gait due to balance deficits.  We reviewed vestibular HEP.  Pt still seems to present with a peripheral problem, but has vague symptoms.  I re tested bil Gilberto Better without nystagmus or symptoms bil when going down into the testing position. She did have equal symptoms of dizziness when coming up to sitting bil which dissipated in <20 seconds.    Follow Up Recommendations  Home health PT;Supervision for mobility/OOB (vestibular rehab)     Equipment Recommendations  None recommended by PT    Recommendations for Other Services   NA     Precautions / Restrictions Precautions Precautions: Fall Precaution Comments: due to unstediness related to dizziness    Mobility  Bed Mobility Overal bed mobility: Needs Assistance Bed Mobility: Sidelying to Sit;Sit to Sidelying   Sidelying to sit: Min assist     Sit to sidelying: Min assist General bed mobility comments: Min assist to support trunk due to pain during transitions.   Transfers Overall transfer level: Needs assistance Equipment used: Rolling walker (2 wheeled) Transfers: Sit to/from Stand Sit to Stand: Min guard         General transfer comment: min guard assist for safety, verbal cues for safe hand placement.   Ambulation/Gait Ambulation/Gait assistance: Min guard Ambulation Distance (Feet): 150 Feet Assistive device: Rolling walker (2 wheeled) Gait Pattern/deviations: Step-through pattern;Narrow base of support Gait  velocity: decreased Gait velocity interpretation: Below normal speed for age/gender General Gait Details: verbal cues for target finding to help steady herself and her gaze during gait and turns. Min guard assist for safety and balance. Verbal cues for obstacle navigation with RW.           Balance Overall balance assessment: Needs assistance Sitting-balance support: Feet supported;No upper extremity supported Sitting balance-Leahy Scale: Good     Standing balance support: Bilateral upper extremity supported Standing balance-Leahy Scale: Fair                      Cognition Arousal/Alertness: Awake/alert Behavior During Therapy: WFL for tasks assessed/performed         Memory: Decreased short-term memory              Exercises Other Exercises Other Exercises: Review entire sequence of HEP x1 seated vertical and horizontal exercises and brandt daroff exercises.         Pertinent Vitals/Pain Pain Assessment: Faces Faces Pain Scale: Hurts even more Pain Location: right side posterior flank where she fell Pain Descriptors / Indicators: Grimacing;Guarding Pain Intervention(s): Limited activity within patient's tolerance;Monitored during session;Repositioned        PT Goals (current goals can now be found in the care plan section) Acute Rehab PT Goals Patient Stated Goal: to go home so that she can care for her husband.  Progress towards PT goals: Progressing toward goals    Frequency  Min 5X/week    PT Plan Current plan remains appropriate  End of Session Equipment Utilized During Treatment: Gait belt Activity Tolerance: Patient limited by fatigue;Other (comment) (limited by dizziness) Patient left: in chair;with call bell/phone within reach;with chair alarm set     Time: [id number] PT Time Calculation (min) (ACUTE ONLY): 35 min  Charges:  $Gait Training: 8-22 mins $Therapeutic Exercise: 8-22 mins                      Jayin Derousse B.  Samie Reasons, PT, DPT [phone]   10/17/2014, 9:45 AM

## 2015-11-03 ENCOUNTER — Emergency Department (HOSPITAL_COMMUNITY)
Admission: EM | Admit: 2015-11-03 | Discharge: 2015-11-03 | Disposition: A | Discharge status: Home / Self Care | Payer: Medicare Other | Attending: Emergency Medicine | Admitting: Emergency Medicine

## 2015-11-03 ENCOUNTER — Encounter (HOSPITAL_COMMUNITY): Payer: Self-pay

## 2015-11-03 DIAGNOSIS — F17210 Nicotine dependence, cigarettes, uncomplicated: Secondary | ICD-10-CM | POA: Diagnosis not present

## 2015-11-03 DIAGNOSIS — R55 Syncope and collapse: Secondary | ICD-10-CM | POA: Diagnosis not present

## 2015-11-03 DIAGNOSIS — R197 Diarrhea, unspecified: Secondary | ICD-10-CM | POA: Diagnosis present

## 2015-11-03 DIAGNOSIS — R110 Nausea: Secondary | ICD-10-CM | POA: Diagnosis not present

## 2015-11-03 DIAGNOSIS — Z7982 Long term (current) use of aspirin: Secondary | ICD-10-CM | POA: Diagnosis not present

## 2015-11-03 DIAGNOSIS — I10 Essential (primary) hypertension: Secondary | ICD-10-CM | POA: Diagnosis not present

## 2015-11-03 HISTORY — DX: Essential (primary) hypertension: I10

## 2015-11-03 HISTORY — DX: Pure hypercholesterolemia, unspecified: E78.00

## 2015-11-03 LAB — BASIC METABOLIC PANEL
ANION GAP: 11 (ref 5–15)
BUN: 28 mg/dL — ABNORMAL HIGH (ref 6–20)
CALCIUM: 8.6 mg/dL — AB (ref 8.9–10.3)
CO2: 18 mmol/L — AB (ref 22–32)
Chloride: 111 mmol/L (ref 101–111)
Creatinine, Ser: 1.57 mg/dL — ABNORMAL HIGH (ref 0.44–1.00)
GFR, EST AFRICAN AMERICAN: 35 mL/min — AB (ref >60)
GFR, EST NON AFRICAN AMERICAN: 30 mL/min — AB (ref >60)
Glucose, Bld: 185 mg/dL — ABNORMAL HIGH (ref 65–99)
POTASSIUM: 3.3 mmol/L — AB (ref 3.5–5.1)
Sodium: 140 mmol/L (ref 135–145)

## 2015-11-03 LAB — CBC WITH DIFFERENTIAL/PLATELET
BASOS PCT: 0 %
Basophils Absolute: 0 10*3/uL (ref 0.0–0.1)
Eosinophils Absolute: 0 10*3/uL (ref 0.0–0.7)
Eosinophils Relative: 0 %
HCT: 40.4 % (ref 36.0–46.0)
HEMOGLOBIN: 14.2 g/dL (ref 12.0–15.0)
Lymphocytes Relative: 30 %
Lymphs Abs: 1.6 10*3/uL (ref 0.7–4.0)
MCH: 30.7 pg (ref 26.0–34.0)
MCHC: 35.1 g/dL (ref 30.0–36.0)
MCV: 87.4 fL (ref 78.0–100.0)
MONO ABS: 0.1 10*3/uL (ref 0.1–1.0)
Monocytes Relative: 2 %
NEUTROS PCT: 68 %
Neutro Abs: 3.8 10*3/uL (ref 1.7–7.7)
Platelets: 256 10*3/uL (ref 150–400)
RBC: 4.62 MIL/uL (ref 3.87–5.11)
RDW: 14.6 % (ref 11.5–15.5)
WBC: 5.5 10*3/uL (ref 4.0–10.5)

## 2015-11-03 LAB — I-STAT CG4 LACTIC ACID, ED
LACTIC ACID, VENOUS: 2.81 mmol/L — AB (ref 0.5–1.9)
Lactic Acid, Venous: 2.02 mmol/L (ref 0.5–1.9)

## 2015-11-03 MED ORDER — GLYCOPYRROLATE 0.2 MG/ML IJ SOLN
0.1000 mg | Freq: Once | INTRAMUSCULAR | Status: AC
  Administered 2015-11-03: 0.1 mg via INTRAVENOUS
  Filled 2015-11-03: qty 1

## 2015-11-03 MED ORDER — SODIUM CHLORIDE 0.9 % IV BOLUS (SEPSIS)
1000.0000 mL | Freq: Once | INTRAVENOUS | Status: AC
  Administered 2015-11-03: 1000 mL via INTRAVENOUS

## 2015-11-03 MED ORDER — ONDANSETRON HCL 4 MG/2ML IJ SOLN
4.0000 mg | Freq: Once | INTRAMUSCULAR | Status: AC
  Administered 2015-11-03: 4 mg via INTRAVENOUS
  Filled 2015-11-03: qty 2

## 2015-11-03 MED ORDER — DIPHENOXYLATE-ATROPINE 2.5-0.025 MG PO TABS
2.0000 | ORAL_TABLET | Freq: Once | ORAL | Status: AC
  Administered 2015-11-03: 2 via ORAL
  Filled 2015-11-03: qty 2

## 2015-11-03 MED ORDER — DIPHENOXYLATE-ATROPINE 2.5-0.025 MG PO TABS: 1.0000 | ORAL_TABLET | Freq: Four times a day (QID) | ORAL | 0 refills | Status: DC | PRN

## 2015-11-03 MED ORDER — ONDANSETRON 4 MG PO TBDP: 4.0000 mg | ORAL_TABLET | Freq: Three times a day (TID) | ORAL | 0 refills | Status: DC | PRN

## 2015-11-03 NOTE — ED Notes (Signed)
Pt. C/o being shaky. EDP made aware.

## 2015-11-03 NOTE — ED Provider Notes (Signed)
  Physical Exam  BP 101/61   Pulse 87   Temp (!) 96.2 F (35.7 C) (Rectal)   Resp 19   Ht 5\' 5"  (1.651 m)   Wt 135 lb (61.2 kg)   SpO2 99%   BMI 22.47 kg/m   Physical Exam  ED Course  Procedures  MDM Pt with emesis and diarrhea. Hypotensive at arrival. Passed po challenge. Getting 3 liters ivf, repeat lactate          Derwood KaplanAnkit Jessika Rothery, MD 11/03/15 1745

## 2015-11-03 NOTE — ED Provider Notes (Signed)
[facility]EMER[facility]ENCY DEPT Provider Note   CSN: [id number] Arrival date & time: 11/03/15  1403        History   Chief Complaint Chief Complaint  Patient presents with  . Hypotension  . Diarrhea    HPI Kimberly Gregory is a 80 y.o. female. She presents for evaluation of vomiting diarrhea lightheadedness. She ate some barbecue. It was left out overnight and then went outside tomorrow regardless riding mower. Start feeling lightheaded and dizzy and nauseated. She came inside. She had emesis and several episodes of diarrhea. Was lightheaded and felt like she was going to pass out. MS was summoned. The patient was on the toilet diaphoretic complaining of nausea and pale. Laid supine. Pressure of 80. Given IV fluids en route with Zofran and her symptoms have improved. He denies chest pain. Short of breath. No blood pus or mucus in her stool. Heme-negative nonbilious emesis.  HPI  Past Medical History:  Diagnosis Date  . Hypercholesterolemia   . Hypertension   . Vertigo 10/14/2014    Patient Active Problem List   Diagnosis Date Noted  . Severe dizziness 10/14/2014  . UTI (lower urinary tract infection) 10/14/2014  . Benign essential HTN 10/14/2014  . Hyperlipidemia 10/14/2014    Past Surgical History:  Procedure Laterality Date  . ABDOMINAL HYSTERECTOMY    . BLADDER SURGERY    . CATARACT EXTRACTION      OB History    No data available       Home Medications    Prior to Admission medications   Medication Sig Start Date End Date Taking? Authorizing Provider  ALPRAZolam Prudy Feeler) 0.5 MG tablet Take 0.5 mg by mouth 3 (three) times daily as needed. For anxiety 09/08/14  Yes Historical Provider, MD  enalapril (VASOTEC) 5 MG tablet Take 1 tablet (5 mg total) by mouth daily. 10/26/14  Yes Richarda Overlie, MD  lovastatin (MEVACOR) 40 MG tablet Take 40 mg by mouth daily. 09/21/14  Yes Historical Provider, MD  aspirin EC 81 MG EC tablet Take 1 tablet (81 mg total) by mouth daily. Patient not  taking: Reported on 11/03/2015 10/17/14   Richarda Overlie, MD  docusate sodium (COLACE) 100 MG capsule Take 1 capsule (100 mg total) by mouth 2 (two) times daily as needed for mild constipation. Patient not taking: Reported on 11/03/2015 10/17/14   Richarda Overlie, MD  meclizine (ANTIVERT) 25 MG tablet Take 1 tablet (25 mg total) by mouth 3 (three) times daily as needed for dizziness. 10/17/14   Richarda Overlie, MD  scopolamine (TRANSDERM-SCOP) 1 MG/3DAYS Place 1 patch (1.5 mg total) onto the skin every 3 (three) days. 10/17/14   Richarda Overlie, MD    Family History History reviewed. No pertinent family history.  Social History Social History  Substance Use Topics  . Smoking status: Current Every Day Smoker    Packs/day: 0.25    Types: Cigarettes  . Smokeless tobacco: Never Used  . Alcohol use No     Allergies   Review of patient's allergies indicates no known allergies.   Review of Systems Review of Systems  Constitutional: Negative for appetite change, chills, diaphoresis, fatigue and fever.  HENT: Negative for mouth sores, sore throat and trouble swallowing.   Eyes: Negative for visual disturbance.  Respiratory: Negative for cough, chest tightness, shortness of breath and wheezing.   Cardiovascular: Negative for chest pain.  Gastrointestinal: Positive for diarrhea, nausea and vomiting. Negative for abdominal distention and abdominal pain.  Endocrine: Negative for polydipsia, polyphagia and polyuria.  Genitourinary: Negative for dysuria, frequency and hematuria.  Musculoskeletal: Negative for gait problem.  Skin: Negative for color change, pallor and rash.  Neurological: Positive for light-headedness. Negative for dizziness, syncope and headaches.  Hematological: Does not bruise/bleed easily.  Psychiatric/Behavioral: Negative for behavioral problems and confusion.     Physical Exam Updated Vital Signs BP 101/61   Pulse 87   Temp (!) 96.2 F (35.7 C) (Rectal)   Resp 19   Ht 5\' 5"  (1.651  m)   Wt 135 lb (61.2 kg)   SpO2 99%   BMI 22.47 kg/m   Physical Exam  Constitutional: She is oriented to person, place, and time. She appears well-developed and well-nourished. No distress.  HENT:  Head: Normocephalic.  Eyes: Conjunctivae are normal. Pupils are equal, round, and reactive to light. No scleral icterus.  Neck: Normal range of motion. Neck supple. No thyromegaly present.  Cardiovascular: Normal rate and regular rhythm.  Exam reveals no gallop and no friction rub.   No murmur heard. Pulmonary/Chest: Effort normal and breath sounds normal. No respiratory distress. She has no wheezes. She has no rales.  Abdominal: Soft. Bowel sounds are normal. She exhibits no distension. There is no tenderness. There is no rebound.  Soft benign.  Musculoskeletal: Normal range of motion.  Neurological: She is alert and oriented to person, place, and time.  Skin: Skin is warm and dry. No rash noted.  Psychiatric: She has a normal mood and affect. Her behavior is normal.     ED Treatments / Results  Labs (all labs ordered are listed, but only abnormal results are displayed) Labs Reviewed  BASIC METABOLIC PANEL - Abnormal; Notable for the following:       Result Value   Potassium 3.3 (*)    CO2 18 (*)    Glucose, Bld 185 (*)    BUN 28 (*)    Creatinine, Ser 1.57 (*)    Calcium 8.6 (*)    GFR calc non Af Amer 30 (*)    GFR calc Af Amer 35 (*)    All other components within normal limits  I-STAT CG4 LACTIC ACID, ED - Abnormal; Notable for the following:    Lactic Acid, Venous 2.81 (*)    All other components within normal limits  CBC WITH DIFFERENTIAL/PLATELET  I-STAT CG4 LACTIC ACID, ED    EKG  EKG Interpretation None       Radiology No results found.  Procedures Procedures (including critical care time)  Medications Ordered in ED Medications  sodium chloride 0.9 % bolus 1,000 mL (0 mLs Intravenous Stopped 11/03/15 1510)  sodium chloride 0.9 % bolus 1,000 mL (1,000  mLs Intravenous New Bag/Given 11/03/15 1510)  ondansetron (ZOFRAN) injection 4 mg (4 mg Intravenous Given 11/03/15 1432)  diphenoxylate-atropine (LOMOTIL) 2.5-0.025 MG per tablet 2 tablet (2 tablets Oral Given 11/03/15 1432)  glycopyrrolate (ROBINUL) injection 0.1 mg (0.1 mg Intravenous Given 11/03/15 1432)  sodium chloride 0.9 % bolus 1,000 mL (0 mLs Intravenous Stopped 11/03/15 1607)     Initial Impression / Assessment and Plan / ED Course  I have reviewed the triage vital signs and the nursing notes.  Pertinent labs & imaging results that were available during my care of the patient were reviewed by me and considered in my medical decision making (see chart for details).  Clinical Course   Patient given IV fluids, Robinul, Lomotil, Zofran. Feeling well. Taking by mouth. Plan will be finishing last liter of IV fluids, repeat lactate for improvement, road test.  Patient's daughter requested home health care nurse checks. I discussed this with case Production designer, theatre/television/filmmanager. This will be set up for initiation tomorrow.   Final Clinical Impressions(s) / ED Diagnoses   Final diagnoses:  Vaso vagal episode  Nausea  Diarrhea, unspecified type    New Prescriptions New Prescriptions   No medications on file     Rolland PorterMark Yailin Biederman, MD 11/03/15 1636

## 2015-11-03 NOTE — ED Notes (Signed)
Pt assisted off bed pan per nurse request.

## 2015-11-03 NOTE — ED Notes (Signed)
CSW on the phone with patient's daughter.

## 2015-11-03 NOTE — Progress Notes (Signed)
[facility] for [facility] [place], [facility] connections to assist with respite, [facility], and a list private duty agencies to fax number [phone].  [facility] informed unit Diplomatic Services operational officersecretary.  Please give to patient's daughter.

## 2015-11-03 NOTE — Discharge Instructions (Signed)
Push fluids, avoid the heat, stay hydrated. Lomotil as needed for any additional diarrhea. Zofran for nausea.

## 2015-11-03 NOTE — Care Management Note (Signed)
Case Management Note  Patient Details  Name: Kimberly Gregory MRN: [id number] Date of Birth: 06-18-1935  Subjective/Objective:   Patient presents to ED with  Vomiting and lightheadedness.               Action/Plan:  Discussed home health services with daughter Velna HatchetSheila.  She reports she would like [facility]  For the patient as she currently has [facility].  Patient's daughter reports no dme needed currently.  Patient lives at home with her spouse who has dementia.  [facility] resources to assist with respite care in home.  Discussed patient with RN Denny PeonErin and EDP.  No further EDCM needs at this time.   Expected Discharge Date:                  Expected Discharge Plan:  Home w Home Health Services  In-House Referral:     Discharge planning Services  CM Consult  Post Acute Care Choice:    Choice offered to:  Patient, Adult Children  DME Arranged:   (none required per daughter) DME Agency:     HH Arranged:  RN, PT, OT, Nurse's Aide, Social Work [facility] [facility] Agency:  [facility]  Status of Service:  Completed, signed off  If discussed at [employer] Meetings, dates discussed:    Additional CommentsRadford Pax:  Christinea Brizuela, RN 11/03/2015, 5:38 PM

## 2015-11-03 NOTE — ED Notes (Signed)
PAGED AMY CARE MANAGEMENT TO JAMES @[id number] 

## 2015-11-03 NOTE — ED Triage Notes (Signed)
Pt. Coming from home via [facility] for diarrhea, vomiting, and hypotension. Pt. Ate leftovers today then went to mow the lawn. Pt. Came in after mowing lawn and got sick on her stomach. Fire found her on the toilet and could not get a pulse or BP on her. Pt. Moved to the bed and fire got a pulse from her carotid (118). EMS reports patient pale. Pt. Given 1000 mL normal saline. Pt. Last BP 90/65. Pt. Placed on NRB for comfort. Pt. C/o hard to take a deep breath. EMS reports vomit 'bile-looking'. EDP at bedside.

## 2015-11-03 NOTE — ED Notes (Signed)
Pt. Requesting to use bedside commode instead of bedpan. RN explained to the patient that until her BP is better we will use bedpan.

## 2015-11-09 ENCOUNTER — Telehealth: Payer: Self-pay | Admitting: General Practice

## 2015-11-23 ENCOUNTER — Emergency Department (HOSPITAL_COMMUNITY)
Admission: EM | Admit: 2015-11-23 | Discharge: 2015-11-23 | Disposition: A | Discharge status: Home / Self Care | Payer: Medicare Other | Attending: Emergency Medicine | Admitting: Emergency Medicine

## 2015-11-23 ENCOUNTER — Emergency Department (HOSPITAL_COMMUNITY): Payer: Medicare Other

## 2015-11-23 ENCOUNTER — Encounter (HOSPITAL_COMMUNITY): Payer: Self-pay | Admitting: Emergency Medicine

## 2015-11-23 DIAGNOSIS — Z7982 Long term (current) use of aspirin: Secondary | ICD-10-CM | POA: Diagnosis not present

## 2015-11-23 DIAGNOSIS — W010XXA Fall on same level from slipping, tripping and stumbling without subsequent striking against object, initial encounter: Secondary | ICD-10-CM | POA: Diagnosis not present

## 2015-11-23 DIAGNOSIS — Y939 Activity, unspecified: Secondary | ICD-10-CM | POA: Insufficient documentation

## 2015-11-23 DIAGNOSIS — F17210 Nicotine dependence, cigarettes, uncomplicated: Secondary | ICD-10-CM | POA: Diagnosis not present

## 2015-11-23 DIAGNOSIS — R2689 Other abnormalities of gait and mobility: Secondary | ICD-10-CM | POA: Insufficient documentation

## 2015-11-23 DIAGNOSIS — Y999 Unspecified external cause status: Secondary | ICD-10-CM | POA: Insufficient documentation

## 2015-11-23 DIAGNOSIS — I10 Essential (primary) hypertension: Secondary | ICD-10-CM | POA: Diagnosis not present

## 2015-11-23 DIAGNOSIS — Y92009 Unspecified place in unspecified non-institutional (private) residence as the place of occurrence of the external cause: Secondary | ICD-10-CM | POA: Insufficient documentation

## 2015-11-23 DIAGNOSIS — Z79899 Other long term (current) drug therapy: Secondary | ICD-10-CM | POA: Diagnosis not present

## 2015-11-23 DIAGNOSIS — W19XXXA Unspecified fall, initial encounter: Secondary | ICD-10-CM

## 2015-11-23 LAB — CBC WITH DIFFERENTIAL/PLATELET
Basophils Absolute: 0 10*3/uL (ref 0.0–0.1)
Basophils Relative: 0 %
Eosinophils Absolute: 0.1 10*3/uL (ref 0.0–0.7)
Eosinophils Relative: 2 %
HEMATOCRIT: 36.9 % (ref 36.0–46.0)
HEMOGLOBIN: 12.7 g/dL (ref 12.0–15.0)
LYMPHS ABS: 2.1 10*3/uL (ref 0.7–4.0)
LYMPHS PCT: 40 %
MCH: 30 pg (ref 26.0–34.0)
MCHC: 34.4 g/dL (ref 30.0–36.0)
MCV: 87.2 fL (ref 78.0–100.0)
Monocytes Absolute: 0.6 10*3/uL (ref 0.1–1.0)
Monocytes Relative: 11 %
NEUTROS ABS: 2.4 10*3/uL (ref 1.7–7.7)
Neutrophils Relative %: 47 %
Platelets: 252 10*3/uL (ref 150–400)
RBC: 4.23 MIL/uL (ref 3.87–5.11)
RDW: 14.5 % (ref 11.5–15.5)
WBC: 5.2 10*3/uL (ref 4.0–10.5)

## 2015-11-23 LAB — URINALYSIS, ROUTINE W REFLEX MICROSCOPIC
BILIRUBIN URINE: NEGATIVE
Glucose, UA: NEGATIVE mg/dL
HGB URINE DIPSTICK: NEGATIVE
Ketones, ur: NEGATIVE mg/dL
Nitrite: NEGATIVE
PH: 6 (ref 5.0–8.0)
Protein, ur: NEGATIVE mg/dL
SPECIFIC GRAVITY, URINE: 1.031 — AB (ref 1.005–1.030)

## 2015-11-23 LAB — COMPREHENSIVE METABOLIC PANEL
ALT: 13 U/L — AB (ref 14–54)
AST: 23 U/L (ref 15–41)
Albumin: 3.6 g/dL (ref 3.5–5.0)
Alkaline Phosphatase: 51 U/L (ref 38–126)
Anion gap: 8 (ref 5–15)
BUN: 28 mg/dL — AB (ref 6–20)
CHLORIDE: 105 mmol/L (ref 101–111)
CO2: 28 mmol/L (ref 22–32)
CREATININE: 1.45 mg/dL — AB (ref 0.44–1.00)
Calcium: 8.9 mg/dL (ref 8.9–10.3)
GFR calc Af Amer: 39 mL/min — ABNORMAL LOW (ref >60)
GFR calc non Af Amer: 34 mL/min — ABNORMAL LOW (ref >60)
Glucose, Bld: 88 mg/dL (ref 65–99)
Potassium: 4.1 mmol/L (ref 3.5–5.1)
SODIUM: 141 mmol/L (ref 135–145)
Total Bilirubin: 0.8 mg/dL (ref 0.3–1.2)
Total Protein: 6.4 g/dL — ABNORMAL LOW (ref 6.5–8.1)

## 2015-11-23 LAB — URINE MICROSCOPIC-ADD ON

## 2015-11-23 MED ORDER — MECLIZINE HCL 25 MG PO TABS
25.0000 mg | ORAL_TABLET | Freq: Three times a day (TID) | ORAL | Status: DC | PRN
Start: 2015-11-23 — End: 2015-11-23

## 2015-11-23 MED ORDER — ENALAPRIL MALEATE 5 MG PO TABS: 5.0000 mg | ORAL_TABLET | Freq: Every day | ORAL | Status: DC

## 2015-11-23 MED ORDER — ASPIRIN EC 81 MG PO TBEC: 81.0000 mg | DELAYED_RELEASE_TABLET | Freq: Every day | ORAL | Status: DC

## 2015-11-23 MED ORDER — TETANUS-DIPHTH-ACELL PERTUSSIS 5-2.5-18.5 LF-MCG/0.5 IM SUSP: 0.5000 mL | Freq: Once | INTRAMUSCULAR | Status: DC

## 2015-11-23 NOTE — ED Provider Notes (Signed)
[facility] Provider Note   CSN: [id number] Arrival date & time: 11/23/15  0459     History   Chief Complaint Chief Complaint  Patient presents with  . Fall    Headache / Neck Pain     HPI Kimberly Gregory is a 80 y.o. female.  HPI  Pt with hx of vertigo, HTN, HL comes from her home following a fall. Pt is not on anticoagulants. Per pt, she slipped and fell, and was uable to get up and so her brother called EMS. Pt is active and takes care of her brother. Pt complains of headache and neck pain. She denies numbness, tingling, weakness. She denies dizziness at the moment.   Past Medical History:  Diagnosis Date  . Hypercholesterolemia   . Hypertension   . Vertigo 10/14/2014    Patient Active Problem List   Diagnosis Date Noted  . Severe dizziness 10/14/2014  . UTI (lower urinary tract infection) 10/14/2014  . Benign essential HTN 10/14/2014  . Hyperlipidemia 10/14/2014    Past Surgical History:  Procedure Laterality Date  . ABDOMINAL HYSTERECTOMY    . BLADDER SURGERY    . CATARACT EXTRACTION      OB History    No data available       Home Medications    Prior to Admission medications   Medication Sig Start Date End Date Taking? Authorizing Provider  ALPRAZolam Prudy Feeler) 0.5 MG tablet Take 0.5 mg by mouth 3 (three) times daily as needed. For anxiety 09/08/14   Historical Provider, MD  aspirin EC 81 MG EC tablet Take 1 tablet (81 mg total) by mouth daily. Patient not taking: Reported on 11/03/2015 10/17/14   Richarda Overlie, MD  diphenoxylate-atropine (LOMOTIL) 2.5-0.025 MG tablet Take 1 tablet by mouth 4 (four) times daily as needed for diarrhea or loose stools. 11/03/15   Rolland Porter, MD  docusate sodium (COLACE) 100 MG capsule Take 1 capsule (100 mg total) by mouth 2 (two) times daily as needed for mild constipation. Patient not taking: Reported on 11/03/2015 10/17/14   Richarda Overlie, MD  enalapril (VASOTEC) 5 MG tablet Take 1 tablet (5 mg total) by mouth daily.  10/26/14   Richarda Overlie, MD  lovastatin (MEVACOR) 40 MG tablet Take 40 mg by mouth daily. 09/21/14   Historical Provider, MD  meclizine (ANTIVERT) 25 MG tablet Take 1 tablet (25 mg total) by mouth 3 (three) times daily as needed for dizziness. 10/17/14   Richarda Overlie, MD  ondansetron (ZOFRAN ODT) 4 MG disintegrating tablet Take 1 tablet (4 mg total) by mouth every 8 (eight) hours as needed for nausea. 11/03/15   Rolland Porter, MD  scopolamine (TRANSDERM-SCOP) 1 MG/3DAYS Place 1 patch (1.5 mg total) onto the skin every 3 (three) days. 10/17/14   Richarda Overlie, MD    Family History No family history on file.  Social History Social History  Substance Use Topics  . Smoking status: Current Every Day Smoker    Packs/day: 0.25    Types: Cigarettes  . Smokeless tobacco: Never Used  . Alcohol use No     Allergies   Review of patient's allergies indicates no known allergies.   Review of Systems Review of Systems  ROS 10 Systems reviewed and are negative for acute change except as noted in the HPI.     Physical Exam Updated Vital Signs BP 127/61 (BP Location: Right Arm)   Pulse (!) 54   Temp 97.2 F (36.2 C)   Resp 13  Ht 5\' 7"  (1.702 m)   Wt 140 lb (63.5 kg)   SpO2 99%   BMI 21.93 kg/m   Physical Exam  Constitutional: She is oriented to person, place, and time. She appears well-developed and well-nourished.  HENT:  Head: Normocephalic and atraumatic.  Eyes: Conjunctivae and EOM are normal. Pupils are equal, round, and reactive to light.  No nystagmus  Neck: Normal range of motion. Neck supple.  No midline c-spine tenderness  Cardiovascular: Normal rate, regular rhythm and normal heart sounds.   Pulmonary/Chest: Effort normal and breath sounds normal. No respiratory distress. She exhibits no tenderness.  Abdominal: Soft. Bowel sounds are normal. She exhibits no distension. There is no tenderness. There is no rebound and no guarding.  Musculoskeletal:  No long bone tenderness -  upper and lower extrmeities and no pelvic pain, instability.  Neurological: She is alert and oriented to person, place, and time. No cranial nerve deficit. Coordination normal.  Skin: Skin is warm and dry. No rash noted.  Nursing note and vitals reviewed.    ED Treatments / Results  Labs (all labs ordered are listed, but only abnormal results are displayed) Labs Reviewed  COMPREHENSIVE METABOLIC PANEL - Abnormal; Notable for the following:       Result Value   BUN 28 (*)    Creatinine, Ser 1.45 (*)    Total Protein 6.4 (*)    ALT 13 (*)    GFR calc non Af Amer 34 (*)    GFR calc Af Amer 39 (*)    All other components within normal limits  CBC WITH DIFFERENTIAL/PLATELET  URINALYSIS, ROUTINE W REFLEX MICROSCOPIC (NOT AT [facility])    EKG  EKG Interpretation None       Radiology Ct Head Wo Contrast  Result Date: 11/23/2015 CLINICAL DATA:  Patient fell this morning and found by spouse on floor. Poor historian. EXAM: CT HEAD WITHOUT CONTRAST CT CERVICAL SPINE WITHOUT CONTRAST TECHNIQUE: Multidetector CT imaging of the head and cervical spine was performed following the standard protocol without intravenous contrast. Multiplanar CT image reconstructions of the cervical spine were also generated. COMPARISON:  MR brain 10/14/2014. FINDINGS: CT HEAD FINDINGS Brain: No evidence of acute infarction, hemorrhage, hydrocephalus, extra-axial collection or mass lesion/mass effect. Moderate atrophy. Mild hypoattenuation of white matter, favored to represent chronic microvascular ischemic change. Vascular: No hyperdense vessel. Cavernous carotid calcification not unexpected for age. Skull: Normal. Negative for fracture or focal lesion. Sinuses/Orbits: No acute finding. Other: None.  Similar appearance to prior MR brain. CT CERVICAL SPINE FINDINGS Alignment: Anatomic Skull base and vertebrae: No fracture or worrisome osseous lesion. Soft tissues and spinal canal: No prevertebral fluid or swelling. No  visible canal hematoma. Atherosclerosis. Disc levels:  Intervertebral disc spaces appear preserved. Upper chest: Biapical pleural thickening, incompletely evaluated. Recommend chest radiograph. No pneumothorax. Other: None. IMPRESSION: Atrophy and small vessel disease.  No acute intracranial findings. No cervical spine fracture or traumatic subluxation. Biapical pleural thickening, incompletely evaluated. Recommend chest radiograph Electronically Signed   By: Elsie StainJohn T Curnes M.D.   On: 11/23/2015 07:46   Ct Cervical Spine Wo Contrast  Result Date: 11/23/2015 CLINICAL DATA:  Patient fell this morning and found by spouse on floor. Poor historian. EXAM: CT HEAD WITHOUT CONTRAST CT CERVICAL SPINE WITHOUT CONTRAST TECHNIQUE: Multidetector CT imaging of the head and cervical spine was performed following the standard protocol without intravenous contrast. Multiplanar CT image reconstructions of the cervical spine were also generated. COMPARISON:  MR brain 10/14/2014. FINDINGS: CT HEAD FINDINGS  Brain: No evidence of acute infarction, hemorrhage, hydrocephalus, extra-axial collection or mass lesion/mass effect. Moderate atrophy. Mild hypoattenuation of white matter, favored to represent chronic microvascular ischemic change. Vascular: No hyperdense vessel. Cavernous carotid calcification not unexpected for age. Skull: Normal. Negative for fracture or focal lesion. Sinuses/Orbits: No acute finding. Other: None.  Similar appearance to prior MR brain. CT CERVICAL SPINE FINDINGS Alignment: Anatomic Skull base and vertebrae: No fracture or worrisome osseous lesion. Soft tissues and spinal canal: No prevertebral fluid or swelling. No visible canal hematoma. Atherosclerosis. Disc levels:  Intervertebral disc spaces appear preserved. Upper chest: Biapical pleural thickening, incompletely evaluated. Recommend chest radiograph. No pneumothorax. Other: None. IMPRESSION: Atrophy and small vessel disease.  No acute intracranial  findings. No cervical spine fracture or traumatic subluxation. Biapical pleural thickening, incompletely evaluated. Recommend chest radiograph Electronically Signed   By: Elsie Stain M.D.   On: 11/23/2015 07:46    Procedures Procedures (including critical care time)  Medications Ordered in ED Medications - No data to display   Initial Impression / Assessment and Plan / ED Course  I have reviewed the triage vital signs and the nursing notes.  Pertinent labs & imaging results that were available during my care of the patient were reviewed by me and considered in my medical decision making (see chart for details).  Clinical Course    Pt comes in post fall.  DDx includes: - Mechanical falls - ICH - Fractures - Contusions - Soft tissue injury  CT head and Cspine ordered as patient has headache and neck pain post fall, and they are negative. Cspine was cleared post negative imaging - No focal midline c-spine tenderness, pt able to turn head to 45 degrees bilaterally without any pain and able to flex neck to the chest and extend without any pain or neurologic symptoms.  Pt ambulated - and she was extremely unsteady. Pt and sister report that normally pt walks w/o any help. She denies vertigo or dizziness.  Cerebellar exam is normal (finger to nose) Sensory exam normal for bilateral upper and lower extremities Motor exam is 4+/5 CN 2-12 intact  PLAN IS TO GET A UA. If the UA is normal - then Dr. Clarene Duke will order a MRI brain. Wondering is UTI can cause the weakness... Social work has been consulted for help with placement or to get more home help.    Final Clinical Impressions(s) / ED Diagnoses   Final diagnoses:  Fall, initial encounter  Abnormality of gait due to impairment of balance    New Prescriptions New Prescriptions   No medications on file     Derwood Kaplan, MD 11/23/15 [phone]

## 2015-11-23 NOTE — ED Notes (Signed)
Daughter called-- states mother had "torn kitchen apart" -- pt and husband have been arguing and daughter is not sure about fall. Pt's husband is in a wheelchair, has had a stroke.  Spoke with daughter regarding home care.

## 2015-11-23 NOTE — ED Notes (Signed)
RN clarified with Dr. Rhunette CroftNanavati if pt. is a code stroke , he said that she is not a code stroke . Also clariified if she needs tetanus injection and suture cart.

## 2015-11-23 NOTE — Care Management Note (Addendum)
Case Management Note  Patient Details  Name: Sabino DonovanJeanette B Plata MRN: [id number] Date of Birth: [birth year]  Subjective/Objective:                  Pt. slipped and fell at home this evening found by spouse at kitchen floor , reports headache and neck pain   Action/Plan: Follow for disposition needs.   Expected Discharge Date:  11/23/15               Expected Discharge Plan:  Home w Home Health Services  In-House Referral:  NA  Discharge planning Services  CM Consult  Post Acute Care Choice:    Choice offered to:     DME Arranged:    DME Agency:     HH Arranged:    HH Agency:     Status of Service:  In process, will continue to follow  If discussed at Long Length of Stay Meetings, dates discussed:    Additional Comments: Spoke with pt at bedside regarding home health services.  Pt states she cuts her own grass and drives herself and husband to and from appointments.  She states her husband will not allow anyone in the home as caregivers.  States she does not think she needs home health at this time. Oletta CohnWood, Xadrian Craighead, RN 11/23/2015, 11:28 AM

## 2015-11-23 NOTE — ED Notes (Signed)
When pt. Awoke from nap, she was alert & oriented to her name & DOB, but had to be re-oriented to place.

## 2015-11-23 NOTE — ED Triage Notes (Signed)
Pt. slipped and fell at home this evening found by spouse at kitchen floor , reports headache and neck pain , c- collar applied by EMS , CBG = 116 . Alert to place and person .

## 2015-11-23 NOTE — Discharge Instructions (Signed)
Please contact your primary care provider to discuss home health needs such as physical therapy, occupational therapy.

## 2015-11-23 NOTE — ED Notes (Signed)
Attempted x2 to start peripheral without success , IV nurse consult ordered.

## 2015-11-23 NOTE — ED Provider Notes (Signed)
I received this patient in signout from Dr. Rhunette CroftNanavati. We were awaiting UA results and if negative MRI of brain. UA showed small leukocytes, nitrite negative, no evidence of obvious infection. Urine culture sent. Obtained MRI of brain which was negative for acute process. On reexamination, the patient was comfortable and alert. I ambulate of the patient's with her nurse and she was able to ambulate independently. Nurse notes that she has had a significant improvement from previous attempt at ambulation. Patient has been adamant about going home and has refused any consideration of SNF placement after SW consult. I have recommended family that they contact her PCP to have ongoing discussions regarding patient's current living situation and need for physical therapy/home health. They have voiced understanding and I have reviewed return precautions. Patient discharged in satisfactory condition.   Laurence Spatesachel Morgan Reginold Beale, MD 11/23/15 [phone]

## 2015-11-23 NOTE — ED Notes (Signed)
Patient transported to CT scan . 

## 2015-11-23 NOTE — ED Notes (Signed)
Pt ambulated in room without difficulty  

## 2015-11-23 NOTE — Progress Notes (Signed)
CSW engaged with Patient alongside RN Sports coachCase Manager. Pt attributes her fall to being dehydrated after having cut the grass earlier in the day. Patient reports that she needs to get home, and is not interested in SNF placement nor Home Health at this time. Per chart, [facility] however, Patient reports her husband won't let anyone in the house. CSW and RN CM discussed with Patient's bedside RN who reports that Patient may be admitted. CSW signing off. Please contact if new need(s) arise.      Lance MussAshley Gardner,MSW, LCSW [facility] ED/10M Clinical Social Worker [phone]

## 2015-11-24 LAB — URINE CULTURE: Special Requests: NORMAL

## 2016-01-04 ENCOUNTER — Encounter: Payer: Self-pay | Admitting: *Deleted

## 2016-01-05 ENCOUNTER — Encounter: Payer: Self-pay | Admitting: Diagnostic Neuroimaging

## 2016-01-05 ENCOUNTER — Ambulatory Visit (INDEPENDENT_AMBULATORY_CARE_PROVIDER_SITE_OTHER): Payer: Medicare Other | Admitting: Diagnostic Neuroimaging

## 2016-01-05 VITALS — BP 137/72 | HR 77 | Ht 66.0 in

## 2016-01-05 DIAGNOSIS — F03B0 Unspecified dementia, moderate, without behavioral disturbance, psychotic disturbance, mood disturbance, and anxiety: Secondary | ICD-10-CM

## 2016-01-05 DIAGNOSIS — F0390 Unspecified dementia without behavioral disturbance: Secondary | ICD-10-CM

## 2016-01-05 NOTE — Patient Instructions (Signed)
-   increase safety and supervision  - no driving  - patient does not have capability to medically take care of her husband

## 2016-01-05 NOTE — Progress Notes (Signed)
[facility]  PATIENT: Kimberly DonovanJeanette B Roblero DOB: [birth year]  REFERRING CLINICIAN: Ralene Okoy Moreira HISTORY FROM: patient and son REASON FOR VISIT: new consult    HISTORICAL  CHIEF COMPLAINT:  Chief Complaint  Patient presents with  . H/o fall, cognitive decline    rm 7, New Pt, sonDanelle Earthly- Noel, [facility], "fell x 2  in 3 mos; head hurts all the time; memory loss"  . HTN, tobacco abuse    HISTORY OF PRESENT ILLNESS:   80 year old right-handed female here for evaluation of memory loss, fall, tobacco abuse.  One year ago around August 2016, patient fell at home. Ever since that time she's had increasing problems with memory loss and confusion. She tends to repeat herself, forgetting recent events, has short-term memory problems. In August 2017 patient had vomiting, diarrhea, dehydration. In September patient was found at home after she had fallen down. Apparently multiple items and objects were straightened around in medication and is unclear what happened. Patient's husband, who is had a severe stroke in the past, noticed that the lights were turned on at 2:00 the morning. He was able to get to a wheelchair and go see what happened. Patient was taken to the hospital for evaluation.  Patient is in denial of any memory problems. She is annoyed and frustrated at this visit. She does not want any help at home. She would like to go home as soon as possible take care of her husband.   REVIEW OF SYSTEMS: Full 14 system review of systems performed and negative with exception of: Memory loss confusion headache weakness dizziness change in appetite decreased energy shortness of breath ringing sensation weight loss fatigue.  ALLERGIES: No Known Allergies  HOME MEDICATIONS: Outpatient Medications Prior to Visit  Medication Sig Dispense Refill  . enalapril (VASOTEC) 5 MG tablet Take 1 tablet (5 mg total) by mouth daily. 30 tablet 0  . lovastatin (MEVACOR) 40 MG tablet Take 40 mg by mouth  daily.    Marland Kitchen. ALPRAZolam (XANAX) 0.5 MG tablet Take 0.5 mg by mouth 3 (three) times daily as needed. For anxiety    . diphenoxylate-atropine (LOMOTIL) 2.5-0.025 MG tablet Take 1 tablet by mouth 4 (four) times daily as needed for diarrhea or loose stools. (Patient not taking: Reported on 01/05/2016) 30 tablet 0  . ondansetron (ZOFRAN ODT) 4 MG disintegrating tablet Take 1 tablet (4 mg total) by mouth every 8 (eight) hours as needed for nausea. (Patient not taking: Reported on 01/05/2016) 6 tablet 0   No facility-administered medications prior to visit.     PAST MEDICAL HISTORY: Past Medical History:  Diagnosis Date  . Hypercholesterolemia   . Hypertension   . Vertigo 10/14/2014    PAST SURGICAL HISTORY: Past Surgical History:  Procedure Laterality Date  . ABDOMINAL HYSTERECTOMY    . BLADDER SURGERY    . CATARACT EXTRACTION    . CHOLECYSTECTOMY    . KNEE SURGERY Right    arthroscopic    FAMILY HISTORY: History reviewed. No pertinent family history.  SOCIAL HISTORY:  Social History   Social History  . Marital status: Married    Spouse name: N/A  . Number of children: 3  . Years of education: 12   Occupational History  .      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 Smoking status: Current Every Day Smoker    Packs/day: 0.25    Types: Cigarettes  . Smokeless tobacco: Never Used  . Alcohol use  No  . Drug use: No  . Sexual activity: Not on file   Other Topics Concern  . Not on file   Social History Narrative   Lives at home with husband   Caffeine- coffee, 1 cup daily     PHYSICAL EXAM  GENERAL EXAM/CONSTITUTIONAL: Vitals:  Vitals:   01/05/16 1050  BP: 137/72  Pulse: 77  Height: 5\' 6"  (1.676 m)     There is no height or weight on file to calculate BMI.  Visual Acuity Screening   Right eye Left eye Both eyes  Without correction: 20/30 20/70   With correction:        Patient is in no distress; well developed, nourished  and groomed; neck is supple  CARDIOVASCULAR:  Examination of carotid arteries is normal; no carotid bruits  Regular rate and rhythm, no murmurs  Examination of peripheral vascular system by observation and palpation is normal  EYES:  Ophthalmoscopic exam of optic discs and posterior segments is normal; no papilledema or hemorrhages  MUSCULOSKELETAL:  Gait, strength, tone, movements noted in Neurologic exam below  NEUROLOGIC: MENTAL STATUS:  MMSE - Mini Mental State Exam 01/05/2016  Orientation to time 2  Orientation to Place 3  Registration 3  Attention/ Calculation 3  Recall 2  Language- name 2 objects 2  Language- repeat 0  Language- follow 3 step command 3  Language- read & follow direction 1  Write a sentence 1  Copy design 0  Total score 20    awake, alert, oriented to person, place and time  DECR memory intact  DECR attention and concentration  language fluent, comprehension intact, naming intact,   fund of knowledge appropriate  HIGHLY REPETITIVE  MILDLY ARGUMENTATIVE  POOR INSIGHT  CRANIAL NERVE:   2nd - no papilledema on fundoscopic exam  2nd, 3rd, 4th, 6th - pupils equal and reactive to light, visual fields full to confrontation, extraocular muscles intact, no nystagmus  5th - facial sensation symmetric  7th - facial strength symmetric  8th - hearing intact  9th - palate elevates symmetrically, uvula midline  11th - shoulder shrug symmetric  12th - tongue protrusion midline  MOTOR:   normal bulk and tone, full strength in the BUE, BLE  SENSORY:   normal and symmetric to light touch, temperature, vibration  COORDINATION:   finger-nose-finger, fine finger movements normal  REFLEXES:   deep tendon reflexes present and symmetric  GAIT/STATION:   narrow based gait; DIFF WITH TANDEM; romberg is negative    DIAGNOSTIC DATA (LABS, IMAGING, TESTING) - I reviewed patient records, labs, notes, testing and imaging myself where  available.  Lab Results  Component Value Date   WBC 5.2 11/23/2015   HGB 12.7 11/23/2015   HCT 36.9 11/23/2015   MCV 87.2 11/23/2015   PLT 252 11/23/2015      Component Value Date/Time   NA 141 11/23/2015 0541   K 4.1 11/23/2015 0541   CL 105 11/23/2015 0541   CO2 28 11/23/2015 0541   GLUCOSE 88 11/23/2015 0541   BUN 28 (H) 11/23/2015 0541   CREATININE 1.45 (H) 11/23/2015 0541   CALCIUM 8.9 11/23/2015 0541   PROT 6.4 (L) 11/23/2015 0541   ALBUMIN 3.6 11/23/2015 0541   AST 23 11/23/2015 0541   ALT 13 (L) 11/23/2015 0541   ALKPHOS 51 11/23/2015 0541   BILITOT 0.8 11/23/2015 0541   GFRNONAA 34 (L) 11/23/2015 0541   GFRAA 39 (L) 11/23/2015 0541   No results found for: CHOL, HDL, LDLCALC,  LDLDIRECT, TRIG, CHOLHDL No results found for: ZOXW9U No results found for: VITAMINB12 No results found for: TSH  11/23/15 MRI brain I reviewed images myself and agree with interpretation. -VRP  - Exam is motion degraded. - No acute infarct or intracranial hemorrhage. - Mild chronic microvascular changes. - Moderate global atrophy without hydrocephalus     ASSESSMENT AND PLAN  80 y.o. year old female here with progressive short-term memory problems, balance and walking difficulty, with poor insight, highly repetitive, slightly argumentative today. Patient has challenging home situation as she is the primary caregiver for her husband who has had a stroke and needs significant help with his activities of daily living.   Dx:  1. Moderate dementia without behavioral disturbance      PLAN: I spent 60 minutes of face to face time with patient. Greater than 50% of time was spent in counseling and coordination of care with patient. In summary we discussed: - diagnosis, prognosis, treatment options for dementia - the first step is to increase safety and supervision; I advised to patient and son about my concern of her current home living situation and role as primary caregiver for her  husband; in my medical opinion, patient does not have cognitive capability to take care of her husband; in addition patient should stop driving - after safety and supervision issues are addressed, we can consider medication options, community resources, clinical research studies  Return in about 1 month (around 02/05/2016).    Suanne Marker, MD 01/05/2016, 11:59 AM Certified in Neurology, Neurophysiology and Neuroimaging  [facility] [address] [place], [place] [address] [phone]

## 2016-02-09 ENCOUNTER — Ambulatory Visit: Payer: Medicare Other | Admitting: Diagnostic Neuroimaging

## 2016-02-10 ENCOUNTER — Encounter: Payer: Self-pay | Admitting: Diagnostic Neuroimaging

## 2016-05-05 ENCOUNTER — Other Ambulatory Visit: Payer: Self-pay | Admitting: Internal Medicine

## 2016-05-05 DIAGNOSIS — Z1231 Encounter for screening mammogram for malignant neoplasm of breast: Secondary | ICD-10-CM

## 2016-05-22 ENCOUNTER — Ambulatory Visit: Payer: Medicare Other

## 2016-06-06 ENCOUNTER — Emergency Department (HOSPITAL_COMMUNITY): Payer: Medicare Other

## 2016-06-06 ENCOUNTER — Emergency Department (HOSPITAL_COMMUNITY)
Admission: EM | Admit: 2016-06-06 | Discharge: 2016-06-06 | Disposition: A | Discharge status: Home / Self Care | Payer: Medicare Other | Attending: Emergency Medicine | Admitting: Emergency Medicine

## 2016-06-06 ENCOUNTER — Encounter (HOSPITAL_COMMUNITY): Payer: Self-pay | Admitting: *Deleted

## 2016-06-06 ENCOUNTER — Other Ambulatory Visit: Payer: Self-pay

## 2016-06-06 DIAGNOSIS — Z79899 Other long term (current) drug therapy: Secondary | ICD-10-CM | POA: Insufficient documentation

## 2016-06-06 DIAGNOSIS — I10 Essential (primary) hypertension: Secondary | ICD-10-CM | POA: Insufficient documentation

## 2016-06-06 DIAGNOSIS — K862 Cyst of pancreas: Secondary | ICD-10-CM | POA: Insufficient documentation

## 2016-06-06 DIAGNOSIS — R1110 Vomiting, unspecified: Secondary | ICD-10-CM

## 2016-06-06 DIAGNOSIS — R1030 Lower abdominal pain, unspecified: Secondary | ICD-10-CM | POA: Diagnosis not present

## 2016-06-06 DIAGNOSIS — R112 Nausea with vomiting, unspecified: Secondary | ICD-10-CM | POA: Insufficient documentation

## 2016-06-06 DIAGNOSIS — R42 Dizziness and giddiness: Secondary | ICD-10-CM | POA: Diagnosis not present

## 2016-06-06 DIAGNOSIS — F17210 Nicotine dependence, cigarettes, uncomplicated: Secondary | ICD-10-CM | POA: Diagnosis not present

## 2016-06-06 LAB — COMPREHENSIVE METABOLIC PANEL WITH GFR
ALT: 15 U/L (ref 14–54)
AST: 25 U/L (ref 15–41)
Albumin: 3.8 g/dL (ref 3.5–5.0)
Alkaline Phosphatase: 55 U/L (ref 38–126)
Anion gap: 10 (ref 5–15)
BUN: 23 mg/dL — ABNORMAL HIGH (ref 6–20)
CO2: 25 mmol/L (ref 22–32)
Calcium: 9.3 mg/dL (ref 8.9–10.3)
Chloride: 105 mmol/L (ref 101–111)
Creatinine, Ser: 1.01 mg/dL — ABNORMAL HIGH (ref 0.44–1.00)
GFR calc Af Amer: >60 mL/min
GFR calc non Af Amer: 52 mL/min — ABNORMAL LOW
Glucose, Bld: 103 mg/dL — ABNORMAL HIGH (ref 65–99)
Potassium: 3.5 mmol/L (ref 3.5–5.1)
Sodium: 140 mmol/L (ref 135–145)
Total Bilirubin: 0.6 mg/dL (ref 0.3–1.2)
Total Protein: 6.9 g/dL (ref 6.5–8.1)

## 2016-06-06 LAB — CBC WITH DIFFERENTIAL/PLATELET
Basophils Absolute: 0 10*3/uL (ref 0.0–0.1)
Basophils Relative: 0 %
EOS ABS: 0 10*3/uL (ref 0.0–0.7)
Eosinophils Relative: 0 %
HCT: 36.3 % (ref 36.0–46.0)
HEMOGLOBIN: 12.6 g/dL (ref 12.0–15.0)
LYMPHS ABS: 2.6 10*3/uL (ref 0.7–4.0)
Lymphocytes Relative: 28 %
MCH: 29.7 pg (ref 26.0–34.0)
MCHC: 34.7 g/dL (ref 30.0–36.0)
MCV: 85.6 fL (ref 78.0–100.0)
MONOS PCT: 6 %
Monocytes Absolute: 0.6 10*3/uL (ref 0.1–1.0)
NEUTROS PCT: 66 %
Neutro Abs: 6.2 10*3/uL (ref 1.7–7.7)
Platelets: 268 10*3/uL (ref 150–400)
RBC: 4.24 MIL/uL (ref 3.87–5.11)
RDW: 15.1 % (ref 11.5–15.5)
WBC: 9.5 10*3/uL (ref 4.0–10.5)

## 2016-06-06 LAB — I-STAT CHEM 8, ED
BUN: 26 mg/dL — ABNORMAL HIGH (ref 6–20)
CALCIUM ION: 1.18 mmol/L (ref 1.15–1.40)
Chloride: 106 mmol/L (ref 101–111)
Creatinine, Ser: 0.9 mg/dL (ref 0.44–1.00)
Glucose, Bld: 98 mg/dL (ref 65–99)
HCT: 38 % (ref 36.0–46.0)
HEMOGLOBIN: 12.9 g/dL (ref 12.0–15.0)
Potassium: 3.6 mmol/L (ref 3.5–5.1)
SODIUM: 143 mmol/L (ref 135–145)
TCO2: 26 mmol/L (ref 0–100)

## 2016-06-06 LAB — ABO/RH: ABO/RH(D): O POS

## 2016-06-06 LAB — TYPE AND SCREEN
ABO/RH(D): O POS
ANTIBODY SCREEN: NEGATIVE

## 2016-06-06 LAB — URINALYSIS, ROUTINE W REFLEX MICROSCOPIC
BACTERIA UA: NONE SEEN
Bilirubin Urine: NEGATIVE
Glucose, UA: NEGATIVE mg/dL
Ketones, ur: 5 mg/dL — AB
Leukocytes, UA: NEGATIVE
Nitrite: NEGATIVE
PH: 6 (ref 5.0–8.0)
Protein, ur: NEGATIVE mg/dL
SPECIFIC GRAVITY, URINE: 1.019 (ref 1.005–1.030)

## 2016-06-06 LAB — I-STAT TROPONIN, ED: TROPONIN I, POC: 0 ng/mL (ref 0.00–0.08)

## 2016-06-06 LAB — PROTIME-INR
INR: 1.12
Prothrombin Time: 14.5 seconds (ref 11.4–15.2)

## 2016-06-06 LAB — POC OCCULT BLOOD, ED: Fecal Occult Bld: NEGATIVE

## 2016-06-06 MED ORDER — PANTOPRAZOLE SODIUM 20 MG PO TBEC: 20.0000 mg | DELAYED_RELEASE_TABLET | Freq: Two times a day (BID) | ORAL | 0 refills | Status: DC

## 2016-06-06 NOTE — ED Provider Notes (Signed)
[facility]-EMERGENCY DEPT Provider Note   CSN: [id number] Arrival date & time: 06/06/16  1144     History   Chief Complaint Chief Complaint  Patient presents with  . Near Syncope    HPI Kimberly Gregory is a 81 y.o. female.  HPI   Describes episodes of dizziness "swimmy-headedness" chronically, diagnosed with vertigo and does PT.  Had an episode of dizziness today and she sat down on the floor. No fall or syncope. Husband called EMS. Also reports episode of ?coffee ground emesis, and felt like had a hard black stool last night. Episode of emesis last night and this AM.  Constipated.  Associated nausea and spit up mucus with black speckles. Had nosebleed several days ago and ate some chocolate prior to this episode.  Stomach feels swollen over past day or so, "feels a little sore."  Reports lower abdominal pain.   Past Medical History:  Diagnosis Date  . Hypercholesterolemia   . Hypertension   . Vertigo 10/14/2014    Patient Active Problem List   Diagnosis Date Noted  . Severe dizziness 10/14/2014  . UTI (lower urinary tract infection) 10/14/2014  . Benign essential HTN 10/14/2014  . Hyperlipidemia 10/14/2014    Past Surgical History:  Procedure Laterality Date  . ABDOMINAL HYSTERECTOMY    . BLADDER SURGERY    . CATARACT EXTRACTION    . CHOLECYSTECTOMY    . KNEE SURGERY Right    arthroscopic    OB History    No data available       Home Medications    Prior to Admission medications   Medication Sig Start Date End Date Taking? Authorizing Provider  acetaminophen (TYLENOL) 325 MG tablet Take 650 mg by mouth every 6 (six) hours as needed.   Yes Historical Provider, MD  ALPRAZolam Prudy Feeler(XANAX) 0.5 MG tablet Take 0.5 mg by mouth 3 (three) times daily as needed. For anxiety 09/08/14  Yes Historical Provider, MD  diphenhydramine-acetaminophen (TYLENOL PM) 25-500 MG TABS tablet Take 1 tablet by mouth at bedtime as needed (sleep).   Yes Historical Provider, MD  enalapril  (VASOTEC) 5 MG tablet Take 1 tablet (5 mg total) by mouth daily. 10/26/14  Yes Richarda OverlieNayana Abrol, MD  ibuprofen (ADVIL,MOTRIN) 200 MG tablet Take 200 mg by mouth every 6 (six) hours as needed for moderate pain.   Yes Historical Provider, MD  lovastatin (MEVACOR) 40 MG tablet Take 40 mg by mouth daily. 09/21/14  Yes Historical Provider, MD  diphenoxylate-atropine (LOMOTIL) 2.5-0.025 MG tablet Take 1 tablet by mouth 4 (four) times daily as needed for diarrhea or loose stools. Patient not taking: Reported on 01/05/2016 11/03/15   Rolland PorterMark James, MD  ondansetron (ZOFRAN ODT) 4 MG disintegrating tablet Take 1 tablet (4 mg total) by mouth every 8 (eight) hours as needed for nausea. Patient not taking: Reported on 01/05/2016 11/03/15   Rolland PorterMark James, MD  pantoprazole (PROTONIX) 20 MG tablet Take 1 tablet (20 mg total) by mouth 2 (two) times daily. 06/06/16   Alvira MondayErin Addalie Calles, MD    Family History History reviewed. No pertinent family history.  Social History Social History  Substance Use Topics  . Smoking status: Current Every Day Smoker    Packs/day: 0.25    Types: Cigarettes  . Smokeless tobacco: Never Used  . Alcohol use No     Allergies   Patient has no known allergies.   Review of Systems Review of Systems  Constitutional: Negative for fever.  HENT: Negative for sore throat.   Eyes:  Negative for visual disturbance.  Respiratory: Negative for cough and shortness of breath.   Cardiovascular: Negative for chest pain.  Gastrointestinal: Positive for abdominal distention, abdominal pain, nausea and vomiting. Blood in stool: black stool.  Genitourinary: Negative for difficulty urinating.  Musculoskeletal: Negative for back pain and neck pain.  Skin: Negative for rash.  Neurological: Positive for dizziness and light-headedness. Negative for syncope and headaches.     Physical Exam Updated Vital Signs BP (!) 131/56   Pulse 68   Temp 98.3 F (36.8 C) (Oral)   Resp 16   Ht 5\' 5"  (1.651 m)   Wt  138 lb (62.6 kg)   SpO2 98%   BMI 22.96 kg/m   Physical Exam  Constitutional: She is oriented to person, place, and time. She appears well-developed and well-nourished. No distress.  HENT:  Head: Normocephalic and atraumatic.  Eyes: Conjunctivae and EOM are normal.  Neck: Normal range of motion.  Cardiovascular: Normal rate, regular rhythm, normal heart sounds and intact distal pulses.  Exam reveals no gallop and no friction rub.   No murmur heard. Pulmonary/Chest: Effort normal and breath sounds normal. No respiratory distress. She has no wheezes. She has no rales.  Abdominal: Soft. She exhibits distension. There is tenderness (lower abd). There is no guarding.  Musculoskeletal: She exhibits no edema or tenderness.  Neurological: She is alert and oriented to person, place, and time. She has normal strength. No cranial nerve deficit or sensory deficit. Coordination and gait normal. GCS eye subscore is 4. GCS verbal subscore is 5. GCS motor subscore is 6.  Skin: Skin is warm and dry. No rash noted. She is not diaphoretic. No erythema.  Nursing note and vitals reviewed.    ED Treatments / Results  Labs (all labs ordered are listed, but only abnormal results are displayed) Labs Reviewed  COMPREHENSIVE METABOLIC PANEL - Abnormal; Notable for the following:       Result Value   Glucose, Bld 103 (*)    BUN 23 (*)    Creatinine, Ser 1.01 (*)    GFR calc non Af Amer 52 (*)    All other components within normal limits  URINALYSIS, ROUTINE W REFLEX MICROSCOPIC - Abnormal; Notable for the following:    Hgb urine dipstick MODERATE (*)    Ketones, ur 5 (*)    Squamous Epithelial / LPF 0-5 (*)    All other components within normal limits  I-STAT CHEM 8, ED - Abnormal; Notable for the following:    BUN 26 (*)    All other components within normal limits  PROTIME-INR  CBC WITH DIFFERENTIAL/PLATELET  I-STAT TROPOININ, ED  POC OCCULT BLOOD, ED  TYPE AND SCREEN  ABO/RH    EKG  EKG  Interpretation  Date/Time:  Tuesday June 06 2016 11:54:26 EDT Ventricular Rate:  74 PR Interval:    QRS Duration: 91 QT Interval:  396 QTC Calculation: 440 R Axis:   -59 Text Interpretation:  Sinus rhythm Left anterior fascicular block Low voltage, precordial leads No significant change since last tracing Confirmed by [facility] MD, Cathan Gearin ([id number]) on 06/06/2016 12:31:59 PM       Radiology Ct Abdomen Pelvis Wo Contrast  Result Date: 06/06/2016 CLINICAL DATA:  Nausea and vomiting. EXAM: CT ABDOMEN AND PELVIS WITHOUT CONTRAST TECHNIQUE: Multidetector CT imaging of the abdomen and pelvis was performed following the standard protocol without IV contrast. COMPARISON:  07/03/2012 FINDINGS: Lower chest:  Lung bases unremarkable. Hepatobiliary: No focal abnormality in the liver on this study  without intravenous contrast. Gallbladder surgically absent. No intrahepatic or extrahepatic biliary dilation. Pancreas: 17 mm low-density lesion in the uncinate process of the pancreas is unchanged since the prior study has been present since 03/20/2005 when it measured 12 mm. No dilatation of the main pancreatic duct. Spleen: No splenomegaly. No focal mass lesion. Adrenals/Urinary Tract: No adrenal nodule or mass. No gross abnormality evident in either kidney on this noncontrast study. No hydronephrosis. No evidence for hydroureter. The urinary bladder appears normal for the degree of distention. Stomach/Bowel: Moderate hiatal hernia. Question wall thickening gastric antrum, but not well evaluated on this noncontrast study. Duodenum is normally positioned as is the ligament of Treitz. No small bowel wall thickening. No small bowel dilatation. The terminal ileum is normal. Nonvisualization of the appendix is consistent with the reported history of appendectomy. Diverticular changes are noted in the left colon without evidence of diverticulitis. Vascular/Lymphatic: There is abdominal aortic atherosclerosis without  aneurysm. There is no gastrohepatic or hepatoduodenal ligament lymphadenopathy. No intraperitoneal or retroperitoneal lymphadenopathy. No pelvic sidewall lymphadenopathy. Reproductive: Uterus surgically absent.  There is no adnexal mass. Other: No intraperitoneal free fluid. Musculoskeletal: Degenerative changes noted right hip with features suggesting avascular necrosis in both hips. IMPRESSION: 1. No acute findings in the abdomen or pelvis. 2. 17 mm probable cystic lesion uncinate process of the pancreas increased from 12 mm on an exam of 2007, compatible with benign etiology. 3. Possible wall thickening in the gastric antrum, not well assessed by CT. 4. Degenerative changes right hip with potential avascular necrosis in both hips. Electronically Signed   By: Kennith Center M.D.   On: 06/06/2016 14:09    Procedures Procedures (including critical care time)  Medications Ordered in ED Medications - No data to display   Initial Impression / Assessment and Plan / ED Course  I have reviewed the triage vital signs and the nursing notes.  Pertinent labs & imaging results that were available during my care of the patient were reviewed by me and considered in my medical decision making (see chart for details).     81yo female with history of htn, hlpd, vertigo presents with concern for 2 episodes of nausea vomiting with concern for possible coffee ground emesis, hard black stool, episode of dizziness and abdominal pain.  CT abdomen without acute findings, sign of thickening gastric antrum, pancreatic cyst, avascular necrosis.  UA negative.  Hemoccult negative.  Hgb at baseline and normal. Normal vital signs. Doubt clinically significant GI bleed.  Pt had nose bleed previously, which likely contributed to appearance of emesis.  No dizziness in ED, likely episode of chronic vertigo. No stroke like symptoms, normal gait.    Suspect possible viral illness or episode of chronic vertigo.  Discussed all results  in detail. Recommend follow up with PCP, gastroenterology, initiating PPI.  Final Clinical Impressions(s) / ED Diagnoses   Final diagnoses:  Dizziness  Nausea and vomiting, intractability of vomiting not specified, unspecified vomiting type  Pancreatic cyst    New Prescriptions Discharge Medication List as of 06/06/2016  4:41 PM    START taking these medications   Details  pantoprazole (PROTONIX) 20 MG tablet Take 1 tablet (20 mg total) by mouth 2 (two) times daily., Starting Tue 06/06/2016, Print         Alvira Monday, MD 06/06/16 2043

## 2016-06-06 NOTE — ED Notes (Signed)
Patient transported to CT 

## 2016-06-06 NOTE — Discharge Instructions (Signed)
Start the protonix medication as prescribed given thickening of stomach, possible gastritis and follow up with Gastroenterology.

## 2016-06-06 NOTE — ED Triage Notes (Signed)
Pt arrives from home via [facility]. Pt states she felt like she was going to pass out and laid down in the bathroom floor. Pt states she has a hx of vertigo, but this feels different. Pt states she has had hard black stool and had one episode of coffee ground emesis.

## 2016-06-06 NOTE — ED Notes (Signed)
Pt is in stable condition upon d/c and is escorted from ED via wheelchair. 

## 2016-06-06 NOTE — ED Notes (Signed)
Pt returned from CT °

## 2016-06-13 ENCOUNTER — Observation Stay (HOSPITAL_COMMUNITY)
Admission: EM | Admit: 2016-06-13 | Discharge: 2016-06-14 | Disposition: A | Discharge status: Home / Self Care | Payer: Medicare Other | Attending: Family Medicine | Admitting: Family Medicine

## 2016-06-13 ENCOUNTER — Emergency Department (HOSPITAL_COMMUNITY): Payer: Medicare Other

## 2016-06-13 ENCOUNTER — Encounter (HOSPITAL_COMMUNITY): Payer: Self-pay | Admitting: Obstetrics and Gynecology

## 2016-06-13 DIAGNOSIS — F17210 Nicotine dependence, cigarettes, uncomplicated: Secondary | ICD-10-CM | POA: Diagnosis not present

## 2016-06-13 DIAGNOSIS — G92 Toxic encephalopathy: Secondary | ICD-10-CM | POA: Insufficient documentation

## 2016-06-13 DIAGNOSIS — F0390 Unspecified dementia without behavioral disturbance: Secondary | ICD-10-CM | POA: Diagnosis not present

## 2016-06-13 DIAGNOSIS — Z9049 Acquired absence of other specified parts of digestive tract: Secondary | ICD-10-CM | POA: Diagnosis not present

## 2016-06-13 DIAGNOSIS — Z961 Presence of intraocular lens: Secondary | ICD-10-CM | POA: Insufficient documentation

## 2016-06-13 DIAGNOSIS — T424X5A Adverse effect of benzodiazepines, initial encounter: Secondary | ICD-10-CM | POA: Insufficient documentation

## 2016-06-13 DIAGNOSIS — K449 Diaphragmatic hernia without obstruction or gangrene: Secondary | ICD-10-CM | POA: Diagnosis not present

## 2016-06-13 DIAGNOSIS — Z79899 Other long term (current) drug therapy: Secondary | ICD-10-CM | POA: Insufficient documentation

## 2016-06-13 DIAGNOSIS — I10 Essential (primary) hypertension: Secondary | ICD-10-CM | POA: Diagnosis present

## 2016-06-13 DIAGNOSIS — T391X5A Adverse effect of 4-Aminophenol derivatives, initial encounter: Secondary | ICD-10-CM | POA: Diagnosis not present

## 2016-06-13 DIAGNOSIS — E785 Hyperlipidemia, unspecified: Secondary | ICD-10-CM | POA: Insufficient documentation

## 2016-06-13 DIAGNOSIS — N390 Urinary tract infection, site not specified: Principal | ICD-10-CM | POA: Insufficient documentation

## 2016-06-13 DIAGNOSIS — N179 Acute kidney failure, unspecified: Secondary | ICD-10-CM

## 2016-06-13 DIAGNOSIS — K219 Gastro-esophageal reflux disease without esophagitis: Secondary | ICD-10-CM | POA: Insufficient documentation

## 2016-06-13 DIAGNOSIS — R410 Disorientation, unspecified: Secondary | ICD-10-CM

## 2016-06-13 DIAGNOSIS — F419 Anxiety disorder, unspecified: Secondary | ICD-10-CM | POA: Diagnosis not present

## 2016-06-13 DIAGNOSIS — G9340 Encephalopathy, unspecified: Secondary | ICD-10-CM

## 2016-06-13 DIAGNOSIS — R531 Weakness: Secondary | ICD-10-CM | POA: Diagnosis not present

## 2016-06-13 DIAGNOSIS — E860 Dehydration: Secondary | ICD-10-CM | POA: Diagnosis not present

## 2016-06-13 DIAGNOSIS — Z9849 Cataract extraction status, unspecified eye: Secondary | ICD-10-CM | POA: Diagnosis not present

## 2016-06-13 DIAGNOSIS — I700 Atherosclerosis of aorta: Secondary | ICD-10-CM | POA: Insufficient documentation

## 2016-06-13 DIAGNOSIS — E7800 Pure hypercholesterolemia, unspecified: Secondary | ICD-10-CM | POA: Insufficient documentation

## 2016-06-13 DIAGNOSIS — R42 Dizziness and giddiness: Secondary | ICD-10-CM

## 2016-06-13 DIAGNOSIS — Z90710 Acquired absence of both cervix and uterus: Secondary | ICD-10-CM | POA: Insufficient documentation

## 2016-06-13 DIAGNOSIS — E871 Hypo-osmolality and hyponatremia: Secondary | ICD-10-CM | POA: Diagnosis not present

## 2016-06-13 DIAGNOSIS — R197 Diarrhea, unspecified: Secondary | ICD-10-CM | POA: Diagnosis not present

## 2016-06-13 DIAGNOSIS — A09 Infectious gastroenteritis and colitis, unspecified: Secondary | ICD-10-CM | POA: Diagnosis present

## 2016-06-13 HISTORY — DX: Encephalopathy, unspecified: G93.40

## 2016-06-13 HISTORY — DX: Acute kidney failure, unspecified: N17.9

## 2016-06-13 LAB — URINALYSIS, ROUTINE W REFLEX MICROSCOPIC
Bilirubin Urine: NEGATIVE
Glucose, UA: NEGATIVE mg/dL
KETONES UR: 5 mg/dL — AB
Nitrite: NEGATIVE
PH: 5 (ref 5.0–8.0)
Protein, ur: NEGATIVE mg/dL
Specific Gravity, Urine: 1.017 (ref 1.005–1.030)

## 2016-06-13 LAB — CBC WITH DIFFERENTIAL/PLATELET
BASOS PCT: 0 %
Basophils Absolute: 0 10*3/uL (ref 0.0–0.1)
EOS ABS: 0 10*3/uL (ref 0.0–0.7)
EOS PCT: 0 %
HCT: 39.1 % (ref 36.0–46.0)
HEMOGLOBIN: 14 g/dL (ref 12.0–15.0)
LYMPHS ABS: 1.1 10*3/uL (ref 0.7–4.0)
Lymphocytes Relative: 6 %
MCH: 31 pg (ref 26.0–34.0)
MCHC: 35.8 g/dL (ref 30.0–36.0)
MCV: 86.7 fL (ref 78.0–100.0)
MONO ABS: 0.9 10*3/uL (ref 0.1–1.0)
MONOS PCT: 5 %
Neutro Abs: 15.5 10*3/uL — ABNORMAL HIGH (ref 1.7–7.7)
Neutrophils Relative %: 89 %
PLATELETS: 299 10*3/uL (ref 150–400)
RBC: 4.51 MIL/uL (ref 3.87–5.11)
RDW: 15.5 % (ref 11.5–15.5)
WBC: 17.5 10*3/uL — ABNORMAL HIGH (ref 4.0–10.5)

## 2016-06-13 LAB — COMPREHENSIVE METABOLIC PANEL
ALBUMIN: 3.9 g/dL (ref 3.5–5.0)
ALK PHOS: 56 U/L (ref 38–126)
ALT: 15 U/L (ref 14–54)
ANION GAP: 10 (ref 5–15)
AST: 29 U/L (ref 15–41)
BUN: 31 mg/dL — ABNORMAL HIGH (ref 6–20)
CO2: 24 mmol/L (ref 22–32)
Calcium: 9.1 mg/dL (ref 8.9–10.3)
Chloride: 105 mmol/L (ref 101–111)
Creatinine, Ser: 1.35 mg/dL — ABNORMAL HIGH (ref 0.44–1.00)
GFR calc Af Amer: 42 mL/min — ABNORMAL LOW (ref >60)
GFR calc non Af Amer: 37 mL/min — ABNORMAL LOW (ref >60)
GLUCOSE: 128 mg/dL — AB (ref 65–99)
POTASSIUM: 4 mmol/L (ref 3.5–5.1)
SODIUM: 139 mmol/L (ref 135–145)
Total Bilirubin: 0.6 mg/dL (ref 0.3–1.2)
Total Protein: 7.4 g/dL (ref 6.5–8.1)

## 2016-06-13 LAB — RAPID URINE DRUG SCREEN, HOSP PERFORMED
AMPHETAMINES: NOT DETECTED
BARBITURATES: NOT DETECTED
BENZODIAZEPINES: POSITIVE — AB
COCAINE: NOT DETECTED
OPIATES: NOT DETECTED
TETRAHYDROCANNABINOL: NOT DETECTED

## 2016-06-13 LAB — ACETAMINOPHEN LEVEL: Acetaminophen (Tylenol), Serum: <10 ug/mL — ABNORMAL LOW (ref 10–30)

## 2016-06-13 LAB — LIPASE, BLOOD: Lipase: 31 U/L (ref 11–51)

## 2016-06-13 MED ORDER — ENSURE ENLIVE PO LIQD
237.0000 mL | Freq: Two times a day (BID) | ORAL | Status: DC
  Administered 2016-06-14: 237 mL via ORAL

## 2016-06-13 MED ORDER — ACETAMINOPHEN 650 MG RE SUPP: 650.0000 mg | Freq: Four times a day (QID) | RECTAL | Status: DC | PRN

## 2016-06-13 MED ORDER — PANTOPRAZOLE SODIUM 20 MG PO TBEC
20.0000 mg | DELAYED_RELEASE_TABLET | Freq: Two times a day (BID) | ORAL | Status: DC
  Administered 2016-06-13 – 2016-06-14 (×2): 20 mg via ORAL
  Filled 2016-06-13 (×2): qty 1

## 2016-06-13 MED ORDER — ONDANSETRON HCL 4 MG/2ML IJ SOLN: 4.0000 mg | Freq: Four times a day (QID) | INTRAMUSCULAR | Status: DC | PRN

## 2016-06-13 MED ORDER — ENOXAPARIN SODIUM 30 MG/0.3ML ~~LOC~~ SOLN
30.0000 mg | SUBCUTANEOUS | Status: DC
  Administered 2016-06-13: 30 mg via SUBCUTANEOUS
  Filled 2016-06-13: qty 0.3

## 2016-06-13 MED ORDER — SODIUM CHLORIDE 0.9 % IV SOLN
INTRAVENOUS | Status: DC
  Administered 2016-06-13 – 2016-06-14 (×2): via INTRAVENOUS

## 2016-06-13 MED ORDER — DEXTROSE 5 % IV SOLN
1.0000 g | INTRAVENOUS | Status: DC
  Administered 2016-06-13: 1 g via INTRAVENOUS
  Filled 2016-06-13: qty 10

## 2016-06-13 MED ORDER — PRAVASTATIN SODIUM 20 MG PO TABS
20.0000 mg | ORAL_TABLET | Freq: Every day | ORAL | Status: DC
  Administered 2016-06-13: 20 mg via ORAL
  Filled 2016-06-13: qty 1

## 2016-06-13 MED ORDER — SODIUM CHLORIDE 0.9 % IV SOLN
INTRAVENOUS | Status: DC
  Administered 2016-06-13: 12:00:00 via INTRAVENOUS

## 2016-06-13 MED ORDER — ACETAMINOPHEN 325 MG PO TABS
650.0000 mg | ORAL_TABLET | Freq: Four times a day (QID) | ORAL | Status: DC | PRN
  Administered 2016-06-13 – 2016-06-14 (×2): 650 mg via ORAL
  Filled 2016-06-13 (×2): qty 2

## 2016-06-13 MED ORDER — ONDANSETRON HCL 4 MG PO TABS: 4.0000 mg | ORAL_TABLET | Freq: Four times a day (QID) | ORAL | Status: DC | PRN

## 2016-06-13 MED ORDER — SODIUM CHLORIDE 0.9 % IV BOLUS (SEPSIS)
500.0000 mL | Freq: Once | INTRAVENOUS | Status: AC
  Administered 2016-06-13: 500 mL via INTRAVENOUS

## 2016-06-13 MED ORDER — ALPRAZOLAM 0.5 MG PO TABS
0.5000 mg | ORAL_TABLET | Freq: Two times a day (BID) | ORAL | Status: DC | PRN
  Administered 2016-06-13: 0.5 mg via ORAL
  Filled 2016-06-13: qty 1

## 2016-06-13 MED ORDER — SODIUM CHLORIDE 0.9% FLUSH
3.0000 mL | Freq: Two times a day (BID) | INTRAVENOUS | Status: DC
  Administered 2016-06-13 – 2016-06-14 (×2): 3 mL via INTRAVENOUS

## 2016-06-13 NOTE — ED Notes (Signed)
ED Provider at bedside. 

## 2016-06-13 NOTE — ED Notes (Signed)
Patient given ham sandwich and coke.

## 2016-06-13 NOTE — ED Notes (Signed)
Bed: WA01 Expected date:  Expected time:  Means of arrival:  Comments: EMS-forgot 

## 2016-06-13 NOTE — H&P (Addendum)
TRH H&P   Patient Demographics:    Kimberly Gregory, is a 81 y.o. female  MRN: [id number]   DOB - 28-Sep-1935  Admit Date - 06/13/2016  Outpatient Primary MD for the patient is Ralene Ok, MD  Referring MD: Dr. Iantha Fallen  Outpatient Specialists: None   Patient coming from: Home  Chief Complaint  Patient presents with  . Headache   Altered mental status   HPI:    Kimberly Gregory  is a 81 y.o. female, With history of hypertension, anxiety, hyperlipidemia and GERD who was brought to the ED by patient's daughter with acute change in mental status since yesterday. Patient has mild forgetfulness at baseline and takes care of her husband who has stroke. She was seen by her PCP recently and was refilled a 30 day prescription of Xanax on 3/28. Yesterday she was found to be somewhat confused and not getting out of the bed as usual. Patient was complaining of headache as well as had 2 episodes of diarrhea. This morning when daughter went to visit them patient was found to be in bed, confused and had bowel incontinence in bed. Patient was more somnolent and difficult to arouse. Family also concerned that patient had only 9 pills of her Xanax in the bottle (21 pills were gone since 3/28). Also several pills son the bottle of Tylenol PM were missing. Patient denies taking extra medications and is not sure why they have gone missing. She has been complaining of some earache and dizziness since this morning. No history of fever, nausea, vomiting, blurred vision, chest pain, palpitations, shortness of breath abdominal pain, dysuria, tingling or numbness, weakness of extremities. No recent illness or sick contacts.  Patient was seen in the ED on 3/27 for near syncope and dizziness.  Course in the ED Vitals were stable except for soft diastolic blood pressure. Her WBC was elevated to 17.5 K.  Chemistry showed elevated BUN of 31 and creatinine 1.35. LFTs were normal. UA suggestive of UTI. Tylenol level and lipase were normal.  Chest x-ray and head CT were unremarkable. Patient given IV fluids and hospitalist admission requested on observation.  On my evaluation patient's mental status appears around baseline.    Review of systems:    In addition to the HPI above,  No Fever-chills, Mild headache, dizziness, no change in vision or hearing No problems swallowing food or Liquids, No Chest pain, Cough or Shortness of Breath, No Abdominal pain, No Nausea or vomiting,, diarrhea No Blood in stool or Urine, No dysuria, No new skin rashes or bruises, No new joints pains-aches,  No new weakness, tingling, numbness in any extremity, No recent weight gain or loss, No polyuria, polydypsia or polyphagia, Acute confusion No significant Mental Stressors.  A full 10 point Review of Systems was done, except as stated above, all other Review of Systems were negative.   With Past  History of the following :    Past Medical History:  Diagnosis Date  . Hypercholesterolemia   . Hypertension   . Vertigo 10/14/2014      Past Surgical History:  Procedure Laterality Date  . ABDOMINAL HYSTERECTOMY    . BLADDER SURGERY    . CATARACT EXTRACTION    . CHOLECYSTECTOMY    . KNEE SURGERY Right    arthroscopic      Social History:     Social History  Substance Use Topics  . Smoking status: Current Every Day Smoker    Packs/day: 0.25    Types: Cigarettes  . Smokeless tobacco: Never Used  . Alcohol use No     Lives - Home with husband  Mobility - ambulatory     Family History :   No family history of heart disease, diabetes or stroke   Home Medications:   Prior to Admission medications   Medication Sig Start Date End Date Taking? Authorizing Provider  ALPRAZolam Prudy Feeler) 0.5 MG tablet Take 0.5 mg by mouth 3 (three) times daily as needed for anxiety.  09/08/14  Yes  Historical Provider, MD  enalapril (VASOTEC) 5 MG tablet Take 1 tablet (5 mg total) by mouth daily. 10/26/14  Yes Richarda Overlie, MD  pantoprazole (PROTONIX) 20 MG tablet Take 1 tablet (20 mg total) by mouth 2 (two) times daily. 06/06/16  Yes Alvira Monday, MD  acetaminophen (TYLENOL) 325 MG tablet Take 650 mg by mouth every 6 (six) hours as needed for mild pain, moderate pain, fever or headache.     Historical Provider, MD  diphenhydramine-acetaminophen (TYLENOL PM) 25-500 MG TABS tablet Take 1 tablet by mouth at bedtime as needed (sleep).    Historical Provider, MD  diphenoxylate-atropine (LOMOTIL) 2.5-0.025 MG tablet Take 1 tablet by mouth 4 (four) times daily as needed for diarrhea or loose stools. Patient not taking: Reported on 01/05/2016 11/03/15   Rolland Porter, MD  ibuprofen (ADVIL,MOTRIN) 200 MG tablet Take 200 mg by mouth every 6 (six) hours as needed for moderate pain.    Historical Provider, MD  lovastatin (MEVACOR) 40 MG tablet Take 40 mg by mouth daily. 09/21/14   Historical Provider, MD  ondansetron (ZOFRAN ODT) 4 MG disintegrating tablet Take 1 tablet (4 mg total) by mouth every 8 (eight) hours as needed for nausea. Patient not taking: Reported on 01/05/2016 11/03/15   Rolland Porter, MD     Allergies:    No Known Allergies   Physical Exam:   Vitals  Blood pressure (!) 121/46, pulse 67, temperature 97.4 F (36.3 C), temperature source Oral, resp. rate 17, height  (1.6 m), weight 61.2 kg (135 lb), SpO2 100 %.    Gen.: Elderly female lying in bed not in distress HEENT: No pallor, no oral mucosa, supple neck Chest: Clear to auscultation bilaterally, no added sounds CVS: Normal S1 and S2, no murmurs rub or gallop GI: Soft, nondistended, nontender, bowel sounds present Musculoskeletal: Warm, no edema CNS: Alert and oriented, no focal deficit, no neck rigidity.   Data Review:    CBC  Recent Labs Lab 06/13/16 1202  WBC 17.5*  HGB 14.0  HCT 39.1  PLT 299  MCV 86.7  MCH  31.0  MCHC 35.8  RDW 15.5  LYMPHSABS 1.1  MONOABS 0.9  EOSABS 0.0  BASOSABS 0.0   ------------------------------------------------------------------------------------------------------------------  Chemistries   Recent Labs Lab 06/13/16 1249  NA 139  K 4.0  CL 105  CO2 24  GLUCOSE 128*  BUN 31*  CREATININE 1.35*  CALCIUM 9.1  AST 29  ALT 15  ALKPHOS 56  BILITOT 0.6   ------------------------------------------------------------------------------------------------------------------ estimated creatinine clearance is 28.4 mL/min (A) (by C-G formula based on SCr of 1.35 mg/dL (H)). ------------------------------------------------------------------------------------------------------------------ No results for input(s): TSH, T4TOTAL, T3FREE, THYROIDAB in the last 72 hours.  Invalid input(s): FREET3  Coagulation profile No results for input(s): INR, PROTIME in the last 168 hours. ------------------------------------------------------------------------------------------------------------------- No results for input(s): DDIMER in the last 72 hours. -------------------------------------------------------------------------------------------------------------------  Cardiac Enzymes No results for input(s): CKMB, TROPONINI, MYOGLOBIN in the last 168 hours.  Invalid input(s): CK ------------------------------------------------------------------------------------------------------------------ No results found for: BNP   ---------------------------------------------------------------------------------------------------------------  Urinalysis    Component Value Date/Time   COLORURINE AMBER (A) 06/13/2016 1219   APPEARANCEUR HAZY (A) 06/13/2016 1219   LABSPEC 1.017 06/13/2016 1219   PHURINE 5.0 06/13/2016 1219   GLUCOSEU NEGATIVE 06/13/2016 1219   HGBUR SMALL (A) 06/13/2016 1219   BILIRUBINUR NEGATIVE 06/13/2016 1219   KETONESUR 5 (A) 06/13/2016 1219   PROTEINUR NEGATIVE  06/13/2016 1219   UROBILINOGEN 1.0 10/14/2014 0745   NITRITE NEGATIVE 06/13/2016 1219   LEUKOCYTESUR TRACE (A) 06/13/2016 1219    ----------------------------------------------------------------------------------------------------------------   Imaging Results:    Dg Chest 2 View  Result Date: 06/13/2016 CLINICAL DATA:  Initial evaluation for acute confusion, leukocytosis. EXAM: CHEST  2 VIEW COMPARISON:  Prior radiograph from 10/14/2014. FINDINGS: The cardiac and mediastinal silhouettes are stable in size and contour, and remain within normal limits. Mild aortic atherosclerosis. The lungs are normally inflated. No airspace consolidation, pleural effusion, or pulmonary edema is identified. Asymmetric density at the right lung apex on frontal projection favored to be due to overlapping osseous shadows. There is no pneumothorax. No acute osseous abnormality identified. IMPRESSION: 1. No active cardiopulmonary disease. 2. Aortic atherosclerosis. Electronically Signed   By: Rise Mu M.D.   On: 06/13/2016 14:00   Ct Head Wo Contrast  Result Date: 06/13/2016 CLINICAL DATA:  Initial evaluation for acute headache, confusion. History of dementia. EXAM: CT HEAD WITHOUT CONTRAST TECHNIQUE: Contiguous axial images were obtained from the base of the skull through the vertex without intravenous contrast. COMPARISON:  Prior CT and MRI from 11/23/2015. FINDINGS: Brain: Generalized age-related cerebral atrophy with chronic microvascular ischemic disease. No acute intracranial hemorrhage. No findings to suggest acute large vessel territory infarct. No mass lesion, midline shift or mass effect. Ventricles normal size without evidence for hydrocephalus. No extra-axial fluid collection. Vascular: No asymmetric hyperdense vessel. Scattered vascular calcifications noted within the carotid siphons. Skull: Scalp soft tissues within normal limits.  Calvarium intact. Sinuses/Orbits: Globes and orbital soft tissues  within normal limits. Patient is status post lens extraction bilaterally. Paranasal sinuses are clear. No mastoid effusion. Other: None. IMPRESSION: 1. No acute intracranial process. 2. Generalized age-related cerebral atrophy with mild chronic small vessel ischemic disease. Electronically Signed   By: Rise Mu M.D.   On: 06/13/2016 13:03    My personal review of EKG: Pending  Assessment & Plan:    Principal Problem:   Acute Metabolic encephalopathy Likely combination of polypharmacy (Xanax and Tylenol PM), UTI and dehydration with diarrhea. Also on telemetry overnight. Given her acute encephalopathy and bowel incontinence this morning will rule out for seizures. Obtain EEG. IV hydration with normal saline. Check TSH, HIV and B12. PT evaluation. Continue neuro checks.   Active Problems:   Severe dizziness Has been ongoing for past few days. Head CT unremarkable. Check orthostatic vitals. Blood pressure is soft on admission. IV hydration.    Lower  urinary tract infectious disease Check urine culture. Will place on empiric IV Rocephin.    Benign essential HTN Blood pressure soft . Hold blood pressure medications.     AKI (acute kidney injury) (HCC) Prerenal secondary to dehydration. Hold ACE inhibitor and avoid NSAIDs. Monitor with IV fluids.  Tobacco abuse Counseled on cessation.   DVT Prophylaxis : Lovenox  AM Labs Ordered, also please review Full Orders  Family Communication: Admission, patients condition and plan of care including tests being ordered have been discussed with the patient andHer daughter at bedside  Code Status Full code  Likely DC to  Home  Condition Fair  Consults called: None   Admission status: Observation  Time spent in minutes : 55   Eddie North M.D on 06/13/2016 at 3:36 PM  Between 7am to 7pm - Pager - [phone]. After 7pm go to [email or web address] - password [facility]  [facility] - Office  [phone]

## 2016-06-13 NOTE — ED Provider Notes (Signed)
[facility]-EMERGENCY DEPT Provider Note   CSN: [id number] Arrival date & time: 06/13/16  1114     History   Chief Complaint Chief Complaint  Patient presents with  . Headache    HPI Kimberly Gregory is a 81 y.o. female.  HPI Patient presents to the emergency room for evaluation of headache and possible medication overuse, diarrhea and weakness.  Patient has a history of chronic headaches. She takes over-the-counter Tylenol migraine and Tylenol PM. She also takes Xanax for anxiety.  Family states that patient was complaining of headache yesterday. She took her medications. She was having episodes of diarrhea.  Non bloody. She slept most of the day. This morning when they checked on her today found that she had diarrhea in the bed. Patient is also more confused in the morning. That seems to have improved over the patient has no memory of having incontinence. She denies that this occurred. Family is also concerned because she may have taken 21 tablets of her Xanax within the week. She also had a bilateral of Tylenol migraine minutes finished. Patient denies taking any excess but the family is concerned she may have taken too many tablets. Patient is also complaining of some dizziness. Otherwise she denies any trouble with fevers. No numbness or focal weakness. Past Medical History:  Diagnosis Date  . Hypercholesterolemia   . Hypertension   . Vertigo 10/14/2014    Patient Active Problem List   Diagnosis Date Noted  . Severe dizziness 10/14/2014  . UTI (lower urinary tract infection) 10/14/2014  . Benign essential HTN 10/14/2014  . Hyperlipidemia 10/14/2014    Past Surgical History:  Procedure Laterality Date  . ABDOMINAL HYSTERECTOMY    . BLADDER SURGERY    . CATARACT EXTRACTION    . CHOLECYSTECTOMY    . KNEE SURGERY Right    arthroscopic    OB History    No data available       Home Medications    Prior to Admission medications   Medication Sig Start Date End Date Taking?  Authorizing Provider  acetaminophen (TYLENOL) 325 MG tablet Take 650 mg by mouth every 6 (six) hours as needed.    Historical Provider, MD  ALPRAZolam Prudy Feeler) 0.5 MG tablet Take 0.5 mg by mouth 3 (three) times daily as needed. For anxiety 09/08/14   Historical Provider, MD  diphenhydramine-acetaminophen (TYLENOL PM) 25-500 MG TABS tablet Take 1 tablet by mouth at bedtime as needed (sleep).    Historical Provider, MD  diphenoxylate-atropine (LOMOTIL) 2.5-0.025 MG tablet Take 1 tablet by mouth 4 (four) times daily as needed for diarrhea or loose stools. Patient not taking: Reported on 01/05/2016 11/03/15   Rolland Porter, MD  enalapril (VASOTEC) 5 MG tablet Take 1 tablet (5 mg total) by mouth daily. 10/26/14   Richarda Overlie, MD  ibuprofen (ADVIL,MOTRIN) 200 MG tablet Take 200 mg by mouth every 6 (six) hours as needed for moderate pain.    Historical Provider, MD  lovastatin (MEVACOR) 40 MG tablet Take 40 mg by mouth daily. 09/21/14   Historical Provider, MD  ondansetron (ZOFRAN ODT) 4 MG disintegrating tablet Take 1 tablet (4 mg total) by mouth every 8 (eight) hours as needed for nausea. Patient not taking: Reported on 01/05/2016 11/03/15   Rolland Porter, MD  pantoprazole (PROTONIX) 20 MG tablet Take 1 tablet (20 mg total) by mouth 2 (two) times daily. 06/06/16   Alvira Monday, MD    Family History No family history on file.  Social History Social History  Substance Use Topics  . Smoking status: Current Every Day Smoker    Packs/day: 0.25    Types: Cigarettes  . Smokeless tobacco: Never Used  . Alcohol use No     Allergies   Patient has no known allergies.   Review of Systems Review of Systems  All other systems reviewed and are negative.    Physical Exam Updated Vital Signs BP (!) 121/46   Pulse 67   Temp 97.4 F (36.3 C) (Oral)   Resp 17   Ht  (1.6 m)   Wt 61.2 kg   SpO2 100%   BMI 23.91 kg/m   Physical Exam  Constitutional: No distress.  HENT:  Head: Normocephalic and  atraumatic.  Right Ear: External ear normal.  Left Ear: External ear normal.  Mouth/Throat: No oropharyngeal exudate.  Eyes: Conjunctivae are normal. Right eye exhibits no discharge. Left eye exhibits no discharge. No scleral icterus.  Neck: Neck supple. No tracheal deviation present.  Cardiovascular: Normal rate, regular rhythm and intact distal pulses.   Pulmonary/Chest: Effort normal and breath sounds normal. No stridor. No respiratory distress. She has no wheezes. She has no rales.  Abdominal: Soft. Bowel sounds are normal. She exhibits no distension. There is tenderness. There is no rebound and no guarding.  Mild epigastric region  Musculoskeletal: She exhibits no edema or tenderness.  Neurological: She is alert. She has normal strength. No cranial nerve deficit (no facial droop, extraocular movements intact, no slurred speech) or sensory deficit. She exhibits normal muscle tone. She displays no seizure activity. Coordination normal.  Skin: Skin is warm and dry. No rash noted. She is not diaphoretic.  Psychiatric: She has a normal mood and affect.  Nursing note and vitals reviewed.    ED Treatments / Results  Labs (all labs ordered are listed, but only abnormal results are displayed) Labs Reviewed  CBC WITH DIFFERENTIAL/PLATELET - Abnormal; Notable for the following:       Result Value   WBC 17.5 (*)    Neutro Abs 15.5 (*)    All other components within normal limits  URINALYSIS, ROUTINE W REFLEX MICROSCOPIC - Abnormal; Notable for the following:    Color, Urine AMBER (*)    APPearance HAZY (*)    Hgb urine dipstick SMALL (*)    Ketones, ur 5 (*)    Leukocytes, UA TRACE (*)    Bacteria, UA RARE (*)    Squamous Epithelial / LPF 0-5 (*)    All other components within normal limits  ACETAMINOPHEN LEVEL - Abnormal; Notable for the following:    Acetaminophen (Tylenol), Serum <10 (*)    All other components within normal limits  COMPREHENSIVE METABOLIC PANEL - Abnormal; Notable  for the following:    Glucose, Bld 128 (*)    BUN 31 (*)    Creatinine, Ser 1.35 (*)    GFR calc non Af Amer 37 (*)    GFR calc Af Amer 42 (*)    All other components within normal limits  LIPASE, BLOOD    EKG  EKG Interpretation None       Radiology Dg Chest 2 View  Result Date: 06/13/2016 CLINICAL DATA:  Initial evaluation for acute confusion, leukocytosis. EXAM: CHEST  2 VIEW COMPARISON:  Prior radiograph from 10/14/2014. FINDINGS: The cardiac and mediastinal silhouettes are stable in size and contour, and remain within normal limits. Mild aortic atherosclerosis. The lungs are normally inflated. No airspace consolidation, pleural effusion, or pulmonary edema is identified. Asymmetric density at  the right lung apex on frontal projection favored to be due to overlapping osseous shadows. There is no pneumothorax. No acute osseous abnormality identified. IMPRESSION: 1. No active cardiopulmonary disease. 2. Aortic atherosclerosis. Electronically Signed   By: Rise Mu M.D.   On: 06/13/2016 14:00   Ct Head Wo Contrast  Result Date: 06/13/2016 CLINICAL DATA:  Initial evaluation for acute headache, confusion. History of dementia. EXAM: CT HEAD WITHOUT CONTRAST TECHNIQUE: Contiguous axial images were obtained from the base of the skull through the vertex without intravenous contrast. COMPARISON:  Prior CT and MRI from 11/23/2015. FINDINGS: Brain: Generalized age-related cerebral atrophy with chronic microvascular ischemic disease. No acute intracranial hemorrhage. No findings to suggest acute large vessel territory infarct. No mass lesion, midline shift or mass effect. Ventricles normal size without evidence for hydrocephalus. No extra-axial fluid collection. Vascular: No asymmetric hyperdense vessel. Scattered vascular calcifications noted within the carotid siphons. Skull: Scalp soft tissues within normal limits.  Calvarium intact. Sinuses/Orbits: Globes and orbital soft tissues within  normal limits. Patient is status post lens extraction bilaterally. Paranasal sinuses are clear. No mastoid effusion. Other: None. IMPRESSION: 1. No acute intracranial process. 2. Generalized age-related cerebral atrophy with mild chronic small vessel ischemic disease. Electronically Signed   By: Rise Mu M.D.   On: 06/13/2016 13:03    Procedures Procedures (including critical care time)  Medications Ordered in ED Medications  0.9 %  sodium chloride infusion ( Intravenous New Bag/Given 06/13/16 1207)  sodium chloride 0.9 % bolus 500 mL (500 mLs Intravenous New Bag/Given 06/13/16 1406)     Initial Impression / Assessment and Plan / ED Course  I have reviewed the triage vital signs and the nursing notes.  Pertinent labs & imaging results that were available during my care of the patient were reviewed by me and considered in my medical decision making (see chart for details).  Clinical Course as of Jun 14 1503  Tue Jun 13, 2016  1417 Updated family on pt's results.  She is looking more alert.  Pt states she is feeling better.  Pt was however incontinent of stool in the AM.  ?syncope  [JK]  1418 Pt does have renal insufficiency and dehydration  [JK]    Clinical Course User Index [JK] Linwood Dibbles, MD    Patient presented to the emergency room with confusion and diarrhea. Family was concerned about possible medication overuse. There is no indications of liver injury or Tylenol overdose. Patient does have evidence of renal sufficiency. She also does have leukocytosis. I suspect this may be related to the diarrhea that she has been having. It's unclear if she had a syncopal episode this morning and that why she was incontinent.  Considering her age, her weakness, her leukocytosis and this episode this morning that she does not recall I will consult the medical service for admission, IV fluid and observation.  Final Clinical Impressions(s) / ED Diagnoses   Final diagnoses:  Weakness    Dehydration  Diarrhea of presumed infectious origin  Confusion    New Prescriptions New Prescriptions   No medications on file     Linwood Dibbles, MD 06/14/16 1606

## 2016-06-13 NOTE — ED Notes (Signed)
Patient transported to CT 

## 2016-06-13 NOTE — ED Triage Notes (Signed)
Per EMS:  Coming from home. Family says she has hx of dementia. Family states pt has taken 45 migraine tylenol, and 21 xanax within a week. Pt lives with husband who had a stroke. 78/50 BP when standing. CBG 124. Pulse in the 70's. Most recent BP 108/39. Pt complains of dizziness when she stands. 18 g IV in the left AC. 400 of fluids run.

## 2016-06-13 NOTE — ED Notes (Signed)
Hospitalist at bedside 

## 2016-06-14 DIAGNOSIS — E860 Dehydration: Secondary | ICD-10-CM | POA: Diagnosis not present

## 2016-06-14 DIAGNOSIS — G9340 Encephalopathy, unspecified: Secondary | ICD-10-CM | POA: Diagnosis not present

## 2016-06-14 DIAGNOSIS — I10 Essential (primary) hypertension: Secondary | ICD-10-CM | POA: Diagnosis not present

## 2016-06-14 DIAGNOSIS — N179 Acute kidney failure, unspecified: Secondary | ICD-10-CM | POA: Diagnosis not present

## 2016-06-14 DIAGNOSIS — N390 Urinary tract infection, site not specified: Secondary | ICD-10-CM | POA: Diagnosis not present

## 2016-06-14 LAB — CBC
HEMATOCRIT: 30.7 % — AB (ref 36.0–46.0)
HEMOGLOBIN: 11 g/dL — AB (ref 12.0–15.0)
MCH: 30.8 pg (ref 26.0–34.0)
MCHC: 35.8 g/dL (ref 30.0–36.0)
MCV: 86 fL (ref 78.0–100.0)
Platelets: 259 10*3/uL (ref 150–400)
RBC: 3.57 MIL/uL — AB (ref 3.87–5.11)
RDW: 15.2 % (ref 11.5–15.5)
WBC: 7 10*3/uL (ref 4.0–10.5)

## 2016-06-14 LAB — BASIC METABOLIC PANEL
Anion gap: 5 (ref 5–15)
BUN: 21 mg/dL — ABNORMAL HIGH (ref 6–20)
CHLORIDE: 109 mmol/L (ref 101–111)
CO2: 27 mmol/L (ref 22–32)
CREATININE: 1.06 mg/dL — AB (ref 0.44–1.00)
Calcium: 8.3 mg/dL — ABNORMAL LOW (ref 8.9–10.3)
GFR calc non Af Amer: 49 mL/min — ABNORMAL LOW (ref >60)
GFR, EST AFRICAN AMERICAN: 57 mL/min — AB (ref >60)
Glucose, Bld: 76 mg/dL (ref 65–99)
POTASSIUM: 4 mmol/L (ref 3.5–5.1)
SODIUM: 141 mmol/L (ref 135–145)

## 2016-06-14 LAB — URINE CULTURE
Culture: NO GROWTH
Special Requests: NORMAL

## 2016-06-14 LAB — GLUCOSE, CAPILLARY: Glucose-Capillary: 81 mg/dL (ref 65–99)

## 2016-06-14 MED ORDER — ENOXAPARIN SODIUM 40 MG/0.4ML ~~LOC~~ SOLN: 40.0000 mg | SUBCUTANEOUS | Status: DC

## 2016-06-14 MED ORDER — CEPHALEXIN 500 MG PO CAPS: 500.0000 mg | ORAL_CAPSULE | Freq: Two times a day (BID) | ORAL | 0 refills | Status: AC

## 2016-06-14 MED ORDER — CEPHALEXIN 500 MG PO CAPS
500.0000 mg | ORAL_CAPSULE | Freq: Two times a day (BID) | ORAL | Status: DC
  Filled 2016-06-14: qty 1

## 2016-06-14 NOTE — Progress Notes (Signed)
Patient is stable for discharge. Discharge instructions and medications have been reviewed with the patient and her son at the bedside, and all questions answered. Instructed son the physical therapy did not recommend any home health, but they did suggest that a family member stay with the patient at home for the next 24-48 hours to be sure that she is safe if her symptoms return. He verbalized understanding of this.   Arta Bruce [facility] 06/14/2016 1:21 PM

## 2016-06-14 NOTE — Progress Notes (Signed)
Tech came over to perform EEG. Nurse states pt will be discharged this afternoon and EEG is  Canceled. No longer warrented

## 2016-06-14 NOTE — Evaluation (Signed)
Physical Therapy Evaluation & discharge Patient Details Name: Kimberly Gregory MRN: [id number] DOB: [birth year] Today's Date: 06/14/2016   History of Present Illness  Kimberly Gregory  is a 81 y.o. female, With history of hypertension, anxiety, hyperlipidemia and GERD who was brought to the ED by patient's daughter with acute change in mental status since yesterday. Patient has mild forgetfulness at baseline and takes care of her husband who has stroke. Family states pt has taken 45 migraine tylenol, and 21 xanax within a week.  Clinical Impression  Pt is near baseline level and plan is to d/c home today.  Pt with decreased short term memory deficits noted during eval.  Recommend S initially when she goes home.  Nursing aware and reports daughter is assisting pt's husband at this time.  No follow up PT needed and will sign off. Thank you for this referral.    Follow Up Recommendations No PT follow up;Supervision/Assistance - 24 hour    Equipment Recommendations  None recommended by PT    Recommendations for Other Services       Precautions / Restrictions Precautions Precautions: Fall Restrictions Weight Bearing Restrictions: No      Mobility  Bed Mobility Overal bed mobility: Independent                Transfers Overall transfer level: Independent               General transfer comment: stood from bed and toilet  Ambulation/Gait Ambulation/Gait assistance: Supervision;Min guard Ambulation Distance (Feet): 350 Feet Assistive device: None Gait Pattern/deviations: Drifts right/left     General Gait Details: Ambulated in room to bathroom and then in hallway with S.  MIN/guard needed when had pt do head nods and head turns.  Stairs            Wheelchair Mobility    Modified Rankin (Stroke Patients Only)       Balance Overall balance assessment: Needs assistance   Sitting balance-Leahy Scale: Normal       Standing balance-Leahy Scale: Good Standing  balance comment: Pt able to shift weight out of BOS and do quick turns in room                             Pertinent Vitals/Pain Pain Assessment: Faces Faces Pain Scale: Hurts little more Pain Location: headache Pain Descriptors / Indicators: Sore    Home Living Family/patient expects to be discharged to:: Private residence Living Arrangements: Spouse/significant other Available Help at Discharge: Family;Available 24 hours/day Type of Home: House Home Access: Stairs to enter Entrance Stairs-Rails: Right;Left;Can reach both Entrance Stairs-Number of Steps: 3 Home Layout: One level Home Equipment: Tub bench Additional Comments: RW, w/c, mototrized scooter of husband's.  She normally doesn't use the tub bench.    Prior Function Level of Independence: Independent         Comments: Cares for her husband (s/p CVA)     Hand Dominance   Dominant Hand: Right    Extremity/Trunk Assessment   Upper Extremity Assessment Upper Extremity Assessment: Overall WFL for tasks assessed    Lower Extremity Assessment Lower Extremity Assessment: Overall WFL for tasks assessed    Cervical / Trunk Assessment Cervical / Trunk Assessment: Normal  Communication   Communication: No difficulties  Cognition Arousal/Alertness: Awake/alert Behavior During Therapy: WFL for tasks assessed/performed Overall Cognitive Status: Impaired/Different from baseline Area of Impairment: Memory;Orientation  Orientation Level: Disoriented to;Time   Memory: Decreased short-term memory                General Comments      Exercises     Assessment/Plan    PT Assessment Patent does not need any further PT services  PT Problem List         PT Treatment Interventions      PT Goals (Current goals can be found in the Care Plan section)  Acute Rehab PT Goals Patient Stated Goal: to go home PT Goal Formulation: All assessment and education complete, DC  therapy    Frequency     Barriers to discharge        Co-evaluation               End of Session Equipment Utilized During Treatment: Gait belt Activity Tolerance: Patient tolerated treatment well Patient left: in chair;with call bell/phone within reach;with chair alarm set Nurse Communication: Mobility status PT Visit Diagnosis: Unsteadiness on feet (R26.81)    Time: 1610-9604 PT Time Calculation (min) (ACUTE ONLY): 25 min   Charges:   PT Evaluation $PT Eval Low Complexity: 1 Procedure PT Treatments $Gait Training: 8-22 mins   PT G Codes:   PT G-Codes **NOT FOR INPATIENT CLASS** Functional Assessment Tool Used: AM-PAC 6 Clicks Basic Mobility Functional Limitation: Mobility: Walking and moving around Mobility: Walking and Moving Around Current Status (V4098): At least 20 percent but less than 40 percent impaired, limited or restricted Mobility: Walking and Moving Around Goal Status [phone]): At least 20 percent but less than 40 percent impaired, limited or restricted Mobility: Walking and Moving Around Discharge Status [phone]): At least 20 percent but less than 40 percent impaired, limited or restricted    Clydie Braun L. Katrinka Blazing, [place] Pager [phone] 06/14/2016   Enzo Montgomery 06/14/2016, 11:01 AM

## 2016-06-14 NOTE — Discharge Summary (Signed)
Physician Discharge Summary  Kimberly Gregory  ZOX:[id number]  DOB: 1935/09/15  DOA: 06/13/2016 PCP: Ralene Ok, MD  Admit date: 06/13/2016 Discharge date: 06/14/2016  Admitted From: Home  Disposition:  Home   Recommendations for Outpatient Follow-up:  1. Follow up with PCP in 1-2 weeks 2. Please obtain BMP/CBC in one week  Discharge Condition: Stable  CODE STATUS: FULL  Diet recommendation: Heart Healthy   Brief/Interim Summary: Kimberly Gregory  is a 81 y.o. female, With history of hypertension, anxiety, hyperlipidemia and GERD who was brought to the ED by patient's daughter with acute change in mental status since yesterday. Patient has mild forgetfulness at baseline and takes care of her husband who has stroke. She was seen by her PCP recently and was refilled a 30 day prescription of Xanax on 3/28. Yesterday she was found to be somewhat confused and not getting out of the bed as usual. Patient was complaining of headache as well as had 2 episodes of diarrhea. This morning when daughter went to visit them patient was found to be in bed, confused and had bowel incontinence in bed. Patient was more somnolent and difficult to arouse. Family also concerned that patient had only 9 pills of her Xanax in the bottle (21 pills were gone since 3/28). Also several pills son the bottle of Tylenol PM were missing. Patient denies taking extra medications and is not sure why they have gone missing. She has been complaining of some earache and dizziness since this morning. No history of fever, nausea, vomiting, blurred vision, chest pain, palpitations, shortness of breath abdominal pain, dysuria, tingling or numbness, weakness of extremities. No recent illness or sick contacts.  Subjective: Patient seen and examined, patient report feeling much better, with no acute event overnight. Per son patient mentally back to baseline. Patient afebrile. Tolerating diet well. Walk with PT with no complications.     Discharge Diagnoses/Hospital Course:  Course in the ED Vitals were stable except for soft diastolic blood pressure. Her WBC was elevated to 17.5 K. Chemistry showed elevated BUN of 31 and creatinine 1.35. LFTs were normal. UA suggestive of UTI. Tylenol level and lipase were normal.  Chest x-ray and head CT were unremarkable. Patient given IV fluids and hospitalist admission requested on observation.  Acute metabolic encephalopathy - multifactorial due to UTI, Hyponatremia and medication induce (Xanax and Tylenol PM)  Treating UTI with Keflex for 5 days  CT with no acute abnormalities  Advised to stop Tylenol PM, try to wean off Xanax to discuss with PCP  Son advised to monitor medication administration  Hyponatremia resolve with IVF   UTI - UA grossly abnormal  Initially treated with Rocephin transitioned to  Keflex x 5 days  Urine cultures negative, but given symptoms will complete course of abx   Hyponatremia/AKI - due to dehydration - resolved with IVF  Monitor Cr in 1 week   Tobacco abuse  Cessation discussed   All other chronic medical condition were stable during the hospitalization.  Patient was seen by physical therapy, with no new recommendations  On the day of the discharge the patient's vitals were stable, and no other acute medical condition were reported by patient. Patient was felt safe to be discharge to home   Discharge Instructions  You were cared for by a hospitalist during your hospital stay. If you have any questions about your discharge medications or the care you received while you were in the hospital after you are discharged, you can call the unit and  asked to speak with the hospitalist on call if the hospitalist that took care of you is not available. Once you are discharged, your primary care physician will handle any further medical issues. Please note that NO REFILLS for any discharge medications will be authorized once you are discharged, as it is  imperative that you return to your primary care physician (or establish a relationship with a primary care physician if you do not have one) for your aftercare needs so that they can reassess your need for medications and monitor your lab values.  Discharge Instructions    Call MD for:  difficulty breathing, headache or visual disturbances    Complete by:  As directed    Call MD for:  extreme fatigue    Complete by:  As directed    Call MD for:  hives    Complete by:  As directed    Call MD for:  persistant dizziness or light-headedness    Complete by:  As directed    Call MD for:  persistant nausea and vomiting    Complete by:  As directed    Call MD for:  redness, tenderness, or signs of infection (pain, swelling, redness, odor or green/yellow discharge around incision site)    Complete by:  As directed    Call MD for:  severe uncontrolled pain    Complete by:  As directed    Call MD for:  temperature >100.4    Complete by:  As directed    Diet - low sodium heart healthy    Complete by:  As directed    Increase activity slowly    Complete by:  As directed      Allergies as of 06/14/2016   No Known Allergies     Medication List    STOP taking these medications   diphenhydramine-acetaminophen 25-500 MG Tabs tablet Commonly known as:  TYLENOL PM   naproxen sodium 220 MG tablet Commonly known as:  ANAPROX     TAKE these medications   ALPRAZolam 0.5 MG tablet Commonly known as:  XANAX Take 0.5 mg by mouth daily as needed for anxiety or sleep.   cephALEXin 500 MG capsule Commonly known as:  KEFLEX Take 1 capsule (500 mg total) by mouth 2 (two) times daily.   diphenoxylate-atropine 2.5-0.025 MG tablet Commonly known as:  LOMOTIL Take 1 tablet by mouth 4 (four) times daily as needed for diarrhea or loose stools.   enalapril 5 MG tablet Commonly known as:  VASOTEC Take 1 tablet (5 mg total) by mouth daily.   lovastatin 40 MG tablet Commonly known as:  MEVACOR Take 40  mg by mouth daily.   ondansetron 4 MG disintegrating tablet Commonly known as:  ZOFRAN ODT Take 1 tablet (4 mg total) by mouth every 8 (eight) hours as needed for nausea.   pantoprazole 20 MG tablet Commonly known as:  PROTONIX Take 1 tablet (20 mg total) by mouth 2 (two) times daily.      Follow-up Information    MOREIRA,ROY, MD. Schedule an appointment as soon as possible for a visit in 1 week(s).   Specialty:  Internal Medicine Contact information: [address] [place] [address] [phone]          No Known Allergies  Consultations:  None   Procedures/Studies: Ct Abdomen Pelvis Wo Contrast  Result Date: 06/06/2016 CLINICAL DATA:  Nausea and vomiting. EXAM: CT ABDOMEN AND PELVIS WITHOUT CONTRAST TECHNIQUE: Multidetector CT imaging of the abdomen and pelvis was  performed following the standard protocol without IV contrast. COMPARISON:  07/03/2012 FINDINGS: Lower chest:  Lung bases unremarkable. Hepatobiliary: No focal abnormality in the liver on this study without intravenous contrast. Gallbladder surgically absent. No intrahepatic or extrahepatic biliary dilation. Pancreas: 17 mm low-density lesion in the uncinate process of the pancreas is unchanged since the prior study has been present since 03/20/2005 when it measured 12 mm. No dilatation of the main pancreatic duct. Spleen: No splenomegaly. No focal mass lesion. Adrenals/Urinary Tract: No adrenal nodule or mass. No gross abnormality evident in either kidney on this noncontrast study. No hydronephrosis. No evidence for hydroureter. The urinary bladder appears normal for the degree of distention. Stomach/Bowel: Moderate hiatal hernia. Question wall thickening gastric antrum, but not well evaluated on this noncontrast study. Duodenum is normally positioned as is the ligament of Treitz. No small bowel wall thickening. No small bowel dilatation. The terminal ileum is normal. Nonvisualization of the appendix is consistent  with the reported history of appendectomy. Diverticular changes are noted in the left colon without evidence of diverticulitis. Vascular/Lymphatic: There is abdominal aortic atherosclerosis without aneurysm. There is no gastrohepatic or hepatoduodenal ligament lymphadenopathy. No intraperitoneal or retroperitoneal lymphadenopathy. No pelvic sidewall lymphadenopathy. Reproductive: Uterus surgically absent.  There is no adnexal mass. Other: No intraperitoneal free fluid. Musculoskeletal: Degenerative changes noted right hip with features suggesting avascular necrosis in both hips. IMPRESSION: 1. No acute findings in the abdomen or pelvis. 2. 17 mm probable cystic lesion uncinate process of the pancreas increased from 12 mm on an exam of 2007, compatible with benign etiology. 3. Possible wall thickening in the gastric antrum, not well assessed by CT. 4. Degenerative changes right hip with potential avascular necrosis in both hips. Electronically Signed   By: Kennith Center M.D.   On: 06/06/2016 14:09   Dg Chest 2 View  Result Date: 06/13/2016 CLINICAL DATA:  Initial evaluation for acute confusion, leukocytosis. EXAM: CHEST  2 VIEW COMPARISON:  Prior radiograph from 10/14/2014. FINDINGS: The cardiac and mediastinal silhouettes are stable in size and contour, and remain within normal limits. Mild aortic atherosclerosis. The lungs are normally inflated. No airspace consolidation, pleural effusion, or pulmonary edema is identified. Asymmetric density at the right lung apex on frontal projection favored to be due to overlapping osseous shadows. There is no pneumothorax. No acute osseous abnormality identified. IMPRESSION: 1. No active cardiopulmonary disease. 2. Aortic atherosclerosis. Electronically Signed   By: Rise Mu M.D.   On: 06/13/2016 14:00   Ct Head Wo Contrast  Result Date: 06/13/2016 CLINICAL DATA:  Initial evaluation for acute headache, confusion. History of dementia. EXAM: CT HEAD WITHOUT  CONTRAST TECHNIQUE: Contiguous axial images were obtained from the base of the skull through the vertex without intravenous contrast. COMPARISON:  Prior CT and MRI from 11/23/2015. FINDINGS: Brain: Generalized age-related cerebral atrophy with chronic microvascular ischemic disease. No acute intracranial hemorrhage. No findings to suggest acute large vessel territory infarct. No mass lesion, midline shift or mass effect. Ventricles normal size without evidence for hydrocephalus. No extra-axial fluid collection. Vascular: No asymmetric hyperdense vessel. Scattered vascular calcifications noted within the carotid siphons. Skull: Scalp soft tissues within normal limits.  Calvarium intact. Sinuses/Orbits: Globes and orbital soft tissues within normal limits. Patient is status post lens extraction bilaterally. Paranasal sinuses are clear. No mastoid effusion. Other: None. IMPRESSION: 1. No acute intracranial process. 2. Generalized age-related cerebral atrophy with mild chronic small vessel ischemic disease. Electronically Signed   By: Rise Mu M.D.   On: 06/13/2016  13:03     Discharge Exam: Vitals:   06/13/16 2056 06/14/16 0419  BP: (!) 112/58 (!) 106/56  Pulse: 74 71  Resp: 18 18  Temp: 98.4 F (36.9 C) 98.4 F (36.9 C)   Vitals:   06/13/16 1547 06/13/16 1650 06/13/16 2056 06/14/16 0419  BP: (!) 105/47 (!) 129/49 (!) 112/58 (!) 106/56  Pulse: 73 74 74 71  Resp: Temp: 97.9 F (36.6 C) 98.4 F (36.9 C) 98.4 F (36.9 C) 98.4 F (36.9 C)  TempSrc: Axillary Oral Oral Oral  SpO2: 93% 100% 100% 100%  Weight:  61.5 kg (135 lb 9.3 oz)    Height:   (1.6 m)      General: Pt is alert, awake, not in acute distress, Alert and oriented x 3 Cardiovascular: RRR, S1/S2 +, no rubs, no gallops Respiratory: CTA bilaterally, no wheezing, no rhonchi Abdominal: Soft, NT, ND, bowel sounds + Extremities: no edema, no cyanosis   The results of significant diagnostics from this  hospitalization (including imaging, microbiology, ancillary and laboratory) are listed below for reference.     Microbiology: No results found for this or any previous visit (from the past 240 hour(s)).   Labs: BNP (last 3 results) No results for input(s): BNP in the last 8760 hours. Basic Metabolic Panel:  Recent Labs Lab 06/13/16 1249 06/14/16 0503  NA 139 141  K 4.0 4.0  CL 105 109  CO2 24 27  GLUCOSE 128* 76  BUN 31* 21*  CREATININE 1.35* 1.06*  CALCIUM 9.1 8.3*   Liver Function Tests:  Recent Labs Lab 06/13/16 1249  AST 29  ALT 15  ALKPHOS 56  BILITOT 0.6  PROT 7.4  ALBUMIN 3.9    Recent Labs Lab 06/13/16 1249  LIPASE 31   No results for input(s): AMMONIA in the last 168 hours. CBC:  Recent Labs Lab 06/13/16 1202 06/14/16 0503  WBC 17.5* 7.0  NEUTROABS 15.5*  --   HGB 14.0 11.0*  HCT 39.1 30.7*  MCV 86.7 86.0  PLT 299 259   Cardiac Enzymes: No results for input(s): CKTOTAL, CKMB, CKMBINDEX, TROPONINI in the last 168 hours. BNP: Invalid input(s): POCBNP CBG:  Recent Labs Lab 06/14/16 0737  GLUCAP 81   D-Dimer No results for input(s): DDIMER in the last 72 hours. Hgb A1c No results for input(s): HGBA1C in the last 72 hours. Lipid Profile No results for input(s): CHOL, HDL, LDLCALC, TRIG, CHOLHDL, LDLDIRECT in the last 72 hours. Thyroid function studies No results for input(s): TSH, T4TOTAL, T3FREE, THYROIDAB in the last 72 hours.  Invalid input(s): FREET3 Anemia work up No results for input(s): VITAMINB12, FOLATE, FERRITIN, TIBC, IRON, RETICCTPCT in the last 72 hours. Urinalysis    Component Value Date/Time   COLORURINE AMBER (A) 06/13/2016 1219   APPEARANCEUR HAZY (A) 06/13/2016 1219   LABSPEC 1.017 06/13/2016 1219   PHURINE 5.0 06/13/2016 1219   GLUCOSEU NEGATIVE 06/13/2016 1219   HGBUR SMALL (A) 06/13/2016 1219   BILIRUBINUR NEGATIVE 06/13/2016 1219   KETONESUR 5 (A) 06/13/2016 1219   PROTEINUR NEGATIVE 06/13/2016 1219    UROBILINOGEN 1.0 10/14/2014 0745   NITRITE NEGATIVE 06/13/2016 1219   LEUKOCYTESUR TRACE (A) 06/13/2016 1219   Sepsis Labs Invalid input(s): PROCALCITONIN,  WBC,  LACTICIDVEN Microbiology No results found for this or any previous visit (from the past 240 hour(s)).   Time coordinating discharge: 35 minutes  SIGNED:  Latrelle Dodrill, MD  [facility] 06/14/2016, 12:52 PM  Pager please text  page via  [email or web address] Password [email or web address]

## 2016-10-21 ENCOUNTER — Emergency Department (HOSPITAL_COMMUNITY): Payer: Medicare Other

## 2016-10-21 ENCOUNTER — Inpatient Hospital Stay (HOSPITAL_COMMUNITY)
Admission: EM | Admit: 2016-10-21 | Discharge: 2016-10-24 | DRG: 393 | Disposition: A | Discharge status: Home / Self Care | Payer: Medicare Other | Attending: Internal Medicine | Admitting: Internal Medicine

## 2016-10-21 ENCOUNTER — Encounter (HOSPITAL_COMMUNITY): Payer: Self-pay

## 2016-10-21 DIAGNOSIS — K921 Melena: Secondary | ICD-10-CM | POA: Diagnosis present

## 2016-10-21 DIAGNOSIS — R6510 Systemic inflammatory response syndrome (SIRS) of non-infectious origin without acute organ dysfunction: Secondary | ICD-10-CM

## 2016-10-21 DIAGNOSIS — R1110 Vomiting, unspecified: Secondary | ICD-10-CM

## 2016-10-21 DIAGNOSIS — I959 Hypotension, unspecified: Secondary | ICD-10-CM | POA: Diagnosis present

## 2016-10-21 DIAGNOSIS — F419 Anxiety disorder, unspecified: Secondary | ICD-10-CM | POA: Diagnosis present

## 2016-10-21 DIAGNOSIS — F552 Abuse of laxatives: Secondary | ICD-10-CM | POA: Diagnosis present

## 2016-10-21 DIAGNOSIS — E872 Acidosis: Secondary | ICD-10-CM | POA: Diagnosis present

## 2016-10-21 DIAGNOSIS — K922 Gastrointestinal hemorrhage, unspecified: Secondary | ICD-10-CM | POA: Diagnosis not present

## 2016-10-21 DIAGNOSIS — R197 Diarrhea, unspecified: Secondary | ICD-10-CM

## 2016-10-21 DIAGNOSIS — F0390 Unspecified dementia without behavioral disturbance: Secondary | ICD-10-CM | POA: Diagnosis present

## 2016-10-21 DIAGNOSIS — E860 Dehydration: Secondary | ICD-10-CM

## 2016-10-21 DIAGNOSIS — E785 Hyperlipidemia, unspecified: Secondary | ICD-10-CM | POA: Diagnosis present

## 2016-10-21 DIAGNOSIS — I10 Essential (primary) hypertension: Secondary | ICD-10-CM | POA: Diagnosis present

## 2016-10-21 DIAGNOSIS — K529 Noninfective gastroenteritis and colitis, unspecified: Secondary | ICD-10-CM | POA: Diagnosis not present

## 2016-10-21 DIAGNOSIS — K559 Vascular disorder of intestine, unspecified: Secondary | ICD-10-CM | POA: Diagnosis present

## 2016-10-21 DIAGNOSIS — Z79899 Other long term (current) drug therapy: Secondary | ICD-10-CM | POA: Diagnosis not present

## 2016-10-21 DIAGNOSIS — G9341 Metabolic encephalopathy: Secondary | ICD-10-CM | POA: Diagnosis present

## 2016-10-21 DIAGNOSIS — R1084 Generalized abdominal pain: Secondary | ICD-10-CM | POA: Diagnosis not present

## 2016-10-21 DIAGNOSIS — D649 Anemia, unspecified: Secondary | ICD-10-CM | POA: Diagnosis present

## 2016-10-21 DIAGNOSIS — E876 Hypokalemia: Secondary | ICD-10-CM | POA: Diagnosis present

## 2016-10-21 DIAGNOSIS — F17210 Nicotine dependence, cigarettes, uncomplicated: Secondary | ICD-10-CM | POA: Diagnosis present

## 2016-10-21 DIAGNOSIS — R933 Abnormal findings on diagnostic imaging of other parts of digestive tract: Secondary | ICD-10-CM | POA: Diagnosis not present

## 2016-10-21 DIAGNOSIS — R680 Hypothermia, not associated with low environmental temperature: Secondary | ICD-10-CM | POA: Diagnosis present

## 2016-10-21 DIAGNOSIS — F03918 Unspecified dementia, unspecified severity, with other behavioral disturbance: Secondary | ICD-10-CM | POA: Diagnosis present

## 2016-10-21 DIAGNOSIS — R112 Nausea with vomiting, unspecified: Secondary | ICD-10-CM | POA: Diagnosis not present

## 2016-10-21 HISTORY — DX: Unspecified osteoarthritis, unspecified site: M19.90

## 2016-10-21 HISTORY — DX: Systemic inflammatory response syndrome (sirs) of non-infectious origin without acute organ dysfunction: R65.10

## 2016-10-21 HISTORY — DX: Headache: R51

## 2016-10-21 HISTORY — DX: Abuse of laxatives: F55.2

## 2016-10-21 HISTORY — DX: Headache, unspecified: R51.9

## 2016-10-21 HISTORY — DX: Unspecified dementia, unspecified severity, without behavioral disturbance, psychotic disturbance, mood disturbance, and anxiety: F03.90

## 2016-10-21 HISTORY — DX: Chronic kidney disease, unspecified: N18.9

## 2016-10-21 LAB — URINALYSIS, ROUTINE W REFLEX MICROSCOPIC
Bacteria, UA: NONE SEEN
GLUCOSE, UA: NEGATIVE mg/dL
Hgb urine dipstick: NEGATIVE
KETONES UR: 5 mg/dL — AB
LEUKOCYTES UA: NEGATIVE
Nitrite: NEGATIVE
PH: 5 (ref 5.0–8.0)
PROTEIN: 100 mg/dL — AB
SQUAMOUS EPITHELIAL / LPF: NONE SEEN
Specific Gravity, Urine: 1.014 (ref 1.005–1.030)
WBC UA: NONE SEEN WBC/hpf (ref 0–5)

## 2016-10-21 LAB — I-STAT TROPONIN, ED: Troponin i, poc: 0.01 ng/mL (ref 0.00–0.08)

## 2016-10-21 LAB — CBC WITH DIFFERENTIAL/PLATELET
BASOS ABS: 0 10*3/uL (ref 0.0–0.1)
Basophils Relative: 0 %
EOS PCT: 0 %
Eosinophils Absolute: 0 10*3/uL (ref 0.0–0.7)
HCT: 34.1 % — ABNORMAL LOW (ref 36.0–46.0)
Hemoglobin: 12 g/dL (ref 12.0–15.0)
LYMPHS ABS: 1.7 10*3/uL (ref 0.7–4.0)
Lymphocytes Relative: 18 %
MCH: 28.8 pg (ref 26.0–34.0)
MCHC: 35.2 g/dL (ref 30.0–36.0)
MCV: 82 fL (ref 78.0–100.0)
MONO ABS: 0.1 10*3/uL (ref 0.1–1.0)
MONOS PCT: 1 %
Neutro Abs: 7.8 10*3/uL — ABNORMAL HIGH (ref 1.7–7.7)
Neutrophils Relative %: 81 %
PLATELETS: 317 10*3/uL (ref 150–400)
RBC: 4.16 MIL/uL (ref 3.87–5.11)
RDW: 14.7 % (ref 11.5–15.5)
WBC: 9.7 10*3/uL (ref 4.0–10.5)

## 2016-10-21 LAB — I-STAT CG4 LACTIC ACID, ED: LACTIC ACID, VENOUS: 2.1 mmol/L — AB (ref 0.5–1.9)

## 2016-10-21 LAB — COMPREHENSIVE METABOLIC PANEL
ALT: 17 U/L (ref 14–54)
ANION GAP: 7 (ref 5–15)
AST: 31 U/L (ref 15–41)
Albumin: 2.7 g/dL — ABNORMAL LOW (ref 3.5–5.0)
Alkaline Phosphatase: 48 U/L (ref 38–126)
BUN: 21 mg/dL — ABNORMAL HIGH (ref 6–20)
CHLORIDE: 120 mmol/L — AB (ref 101–111)
CO2: 16 mmol/L — ABNORMAL LOW (ref 22–32)
Calcium: 8.1 mg/dL — ABNORMAL LOW (ref 8.9–10.3)
Creatinine, Ser: 1.14 mg/dL — ABNORMAL HIGH (ref 0.44–1.00)
GFR, EST AFRICAN AMERICAN: 52 mL/min — AB (ref >60)
GFR, EST NON AFRICAN AMERICAN: 45 mL/min — AB (ref >60)
Glucose, Bld: 126 mg/dL — ABNORMAL HIGH (ref 65–99)
POTASSIUM: 3.4 mmol/L — AB (ref 3.5–5.1)
Sodium: 143 mmol/L (ref 135–145)
TOTAL PROTEIN: 5.2 g/dL — AB (ref 6.5–8.1)
Total Bilirubin: 1 mg/dL (ref 0.3–1.2)

## 2016-10-21 LAB — RAPID URINE DRUG SCREEN, HOSP PERFORMED
Amphetamines: NOT DETECTED
BARBITURATES: NOT DETECTED
BENZODIAZEPINES: POSITIVE — AB
COCAINE: NOT DETECTED
Opiates: NOT DETECTED
Tetrahydrocannabinol: NOT DETECTED

## 2016-10-21 LAB — I-STAT CHEM 8, ED
BUN: 25 mg/dL — ABNORMAL HIGH (ref 6–20)
CREATININE: 0.9 mg/dL (ref 0.44–1.00)
Calcium, Ion: 1.13 mmol/L — ABNORMAL LOW (ref 1.15–1.40)
Chloride: 118 mmol/L — ABNORMAL HIGH (ref 101–111)
Glucose, Bld: 123 mg/dL — ABNORMAL HIGH (ref 65–99)
HEMATOCRIT: 35 % — AB (ref 36.0–46.0)
HEMOGLOBIN: 11.9 g/dL — AB (ref 12.0–15.0)
POTASSIUM: 3.4 mmol/L — AB (ref 3.5–5.1)
SODIUM: 145 mmol/L (ref 135–145)
TCO2: 16 mmol/L (ref 0–100)

## 2016-10-21 LAB — LIPASE, BLOOD: LIPASE: 103 U/L — AB (ref 11–51)

## 2016-10-21 LAB — CBG MONITORING, ED: GLUCOSE-CAPILLARY: 158 mg/dL — AB (ref 65–99)

## 2016-10-21 LAB — OCCULT BLOOD X 1 CARD TO LAB, STOOL: FECAL OCCULT BLD: POSITIVE — AB

## 2016-10-21 LAB — TSH: TSH: 1.023 u[IU]/mL (ref 0.350–4.500)

## 2016-10-21 LAB — CORTISOL: CORTISOL PLASMA: 42.9 ug/dL

## 2016-10-21 MED ORDER — PROMETHAZINE HCL 25 MG/ML IJ SOLN
12.5000 mg | Freq: Once | INTRAMUSCULAR | Status: AC
  Administered 2016-10-21: 12.5 mg via INTRAVENOUS
  Filled 2016-10-21: qty 1

## 2016-10-21 MED ORDER — ENOXAPARIN SODIUM 40 MG/0.4ML ~~LOC~~ SOLN
40.0000 mg | SUBCUTANEOUS | Status: DC
  Administered 2016-10-21: 40 mg via SUBCUTANEOUS
  Filled 2016-10-21: qty 0.4

## 2016-10-21 MED ORDER — VITAMIN D3 25 MCG (1000 UNIT) PO TABS
1000.0000 [IU] | ORAL_TABLET | Freq: Every day | ORAL | Status: DC
  Administered 2016-10-22 – 2016-10-24 (×3): 1000 [IU] via ORAL
  Filled 2016-10-21 (×6): qty 1

## 2016-10-21 MED ORDER — CIPROFLOXACIN IN D5W 400 MG/200ML IV SOLN
400.0000 mg | Freq: Once | INTRAVENOUS | Status: DC
  Filled 2016-10-21: qty 200

## 2016-10-21 MED ORDER — SODIUM CHLORIDE 0.9 % IV SOLN
INTRAVENOUS | Status: DC
  Administered 2016-10-21: 19:00:00 via INTRAVENOUS

## 2016-10-21 MED ORDER — ALPRAZOLAM 0.5 MG PO TABS
0.5000 mg | ORAL_TABLET | Freq: Every day | ORAL | Status: DC | PRN
  Administered 2016-10-22: 0.5 mg via ORAL
  Filled 2016-10-21: qty 1

## 2016-10-21 MED ORDER — SODIUM CHLORIDE 0.9 % IV BOLUS (SEPSIS)
1000.0000 mL | Freq: Once | INTRAVENOUS | Status: AC
  Administered 2016-10-21: 1000 mL via INTRAVENOUS

## 2016-10-21 MED ORDER — VITAMIN C 500 MG/5ML PO SYRP
100.0000 mg | ORAL_SOLUTION | Freq: Every day | ORAL | Status: DC
  Administered 2016-10-22 – 2016-10-24 (×3): 100 mg via ORAL
  Filled 2016-10-21 (×3): qty 1

## 2016-10-21 MED ORDER — CIPROFLOXACIN IN D5W 400 MG/200ML IV SOLN
400.0000 mg | Freq: Two times a day (BID) | INTRAVENOUS | Status: DC
  Administered 2016-10-21 – 2016-10-23 (×4): 400 mg via INTRAVENOUS
  Filled 2016-10-21 (×4): qty 200

## 2016-10-21 MED ORDER — PRAVASTATIN SODIUM 20 MG PO TABS
20.0000 mg | ORAL_TABLET | Freq: Every day | ORAL | Status: DC
  Administered 2016-10-22 – 2016-10-23 (×2): 20 mg via ORAL
  Filled 2016-10-21 (×2): qty 1

## 2016-10-21 MED ORDER — METRONIDAZOLE IN NACL 5-0.79 MG/ML-% IV SOLN
500.0000 mg | Freq: Once | INTRAVENOUS | Status: DC
  Filled 2016-10-21: qty 100

## 2016-10-21 MED ORDER — ACETAMINOPHEN 325 MG PO TABS
650.0000 mg | ORAL_TABLET | Freq: Four times a day (QID) | ORAL | Status: DC | PRN
  Administered 2016-10-21 – 2016-10-22 (×2): 650 mg via ORAL
  Filled 2016-10-21 (×2): qty 2

## 2016-10-21 MED ORDER — CYANOCOBALAMIN 250 MCG PO TABS
250.0000 ug | ORAL_TABLET | Freq: Every day | ORAL | Status: DC
  Administered 2016-10-22 – 2016-10-24 (×3): 250 ug via ORAL
  Filled 2016-10-21 (×3): qty 1

## 2016-10-21 MED ORDER — IOPAMIDOL (ISOVUE-300) INJECTION 61%
INTRAVENOUS | Status: AC
  Administered 2016-10-21: 100 mL via INTRAVENOUS
  Filled 2016-10-21: qty 100

## 2016-10-21 MED ORDER — METRONIDAZOLE IN NACL 5-0.79 MG/ML-% IV SOLN
500.0000 mg | Freq: Three times a day (TID) | INTRAVENOUS | Status: DC
  Administered 2016-10-21 – 2016-10-23 (×6): 500 mg via INTRAVENOUS
  Filled 2016-10-21 (×6): qty 100

## 2016-10-21 MED ORDER — ONDANSETRON HCL 4 MG/2ML IJ SOLN
4.0000 mg | Freq: Four times a day (QID) | INTRAMUSCULAR | Status: DC | PRN
  Administered 2016-10-21: 4 mg via INTRAVENOUS
  Filled 2016-10-21: qty 2

## 2016-10-21 MED ORDER — ONDANSETRON HCL 4 MG PO TABS: 4.0000 mg | ORAL_TABLET | Freq: Four times a day (QID) | ORAL | Status: DC | PRN

## 2016-10-21 MED ORDER — SODIUM CHLORIDE 0.9 % IV SOLN
INTRAVENOUS | Status: DC
  Administered 2016-10-21: 19:00:00 via INTRAVENOUS
  Filled 2016-10-21 (×3): qty 1000

## 2016-10-21 MED ORDER — ONDANSETRON HCL 4 MG/2ML IJ SOLN
4.0000 mg | Freq: Once | INTRAMUSCULAR | Status: AC
  Administered 2016-10-21: 4 mg via INTRAVENOUS
  Filled 2016-10-21: qty 2

## 2016-10-21 MED ORDER — SODIUM CHLORIDE 0.9 % IV SOLN
Freq: Once | INTRAVENOUS | Status: AC
  Administered 2016-10-21: 13:00:00 via INTRAVENOUS

## 2016-10-21 MED ORDER — PANTOPRAZOLE SODIUM 40 MG IV SOLR
40.0000 mg | Freq: Two times a day (BID) | INTRAVENOUS | Status: DC
  Administered 2016-10-21 – 2016-10-23 (×4): 40 mg via INTRAVENOUS
  Filled 2016-10-21 (×4): qty 40

## 2016-10-21 MED ORDER — ACETAMINOPHEN 650 MG RE SUPP: 650.0000 mg | Freq: Four times a day (QID) | RECTAL | Status: DC | PRN

## 2016-10-21 NOTE — ED Provider Notes (Signed)
[facility]-EMERGENCY DEPT Provider Note   CSN: [id number] Arrival date & time: 10/21/16  1025     History   Chief Complaint Chief Complaint  Patient presents with  . GI Problem    HPI Kimberly Gregory is a 81 y.o. female hx of HTN, HL, Pancreatic cyst here presenting with abdominal pain, vomiting, altered mental status. Patient states that she woke up this morning had bad nausea so went to the bathroom and started vomiting. She felt very weak so she laid down and vomited several times. Denies any diarrhea, but she does have some epigastric pain. Denies any fevers or chills or sick contacts. Of note, patient was admitted in March of this year and was diagnosed with urinary tract infection, possible xanax overdose. Denies any overdose today. EMS was called for AMS. She was noted to be altered with nl CBG. Was noted to be hypotensive 60/40 per EMS, given 1500 cc of NS and BP was 11/60 on arrival. Patient was noted to be alert in triage.   The history is provided by the EMS personnel and the patient.   Level V caveat- AMS   Past Medical History:  Diagnosis Date  . Hypercholesterolemia   . Hypertension   . Vertigo 10/14/2014    Patient Active Problem List   Diagnosis Date Noted  . Acute colitis 10/21/2016  . Enteritis 10/21/2016  . Acute metabolic encephalopathy 10/21/2016  . SIRS (systemic inflammatory response syndrome) (HCC) 10/21/2016  . Hypokalemia 10/21/2016  . Metabolic acidosis 10/21/2016  . Dementia 10/21/2016  . Laxative abuse 10/21/2016  . Acute encephalopathy 06/13/2016  . AKI (acute kidney injury) (HCC) 06/13/2016  . Severe dizziness 10/14/2014  . Lower urinary tract infectious disease 10/14/2014  . Benign essential HTN 10/14/2014  . Hyperlipidemia 10/14/2014    Past Surgical History:  Procedure Laterality Date  . ABDOMINAL HYSTERECTOMY    . BLADDER SURGERY    . CATARACT EXTRACTION    . CHOLECYSTECTOMY    . KNEE SURGERY Right    arthroscopic    OB History    No data available       Home Medications    Prior to Admission medications   Medication Sig Start Date End Date Taking? Authorizing Provider  ALPRAZolam Prudy Feeler(XANAX) 0.5 MG tablet Take 0.5 mg by mouth daily as needed for anxiety or sleep.  09/08/14  Yes [provider]  Ascorbic Acid (VITAMIN C) 100 MG tablet Take 100 mg by mouth daily.   Yes [provider]  cholecalciferol (VITAMIN D) 1000 units tablet Take 1,000 Units by mouth daily.   Yes [provider]  enalapril (VASOTEC) 5 MG tablet Take 1 tablet (5 mg total) by mouth daily. 10/26/14  Yes Richarda OverlieAbrol, Nayana, MD  lovastatin (MEVACOR) 40 MG tablet Take 40 mg by mouth daily. 09/21/14  Yes [provider]  vitamin B-12 (CYANOCOBALAMIN) 250 MCG tablet Take 250 mcg by mouth daily.   Yes [provider]  diphenoxylate-atropine (LOMOTIL) 2.5-0.025 MG tablet Take 1 tablet by mouth 4 (four) times daily as needed for diarrhea or loose stools. Patient not taking: Reported on 01/05/2016 11/03/15   Rolland PorterJames, Mark, MD  ondansetron (ZOFRAN ODT) 4 MG disintegrating tablet Take 1 tablet (4 mg total) by mouth every 8 (eight) hours as needed for nausea. Patient not taking: Reported on 01/05/2016 11/03/15   Rolland PorterJames, Mark, MD  pantoprazole (PROTONIX) 20 MG tablet Take 1 tablet (20 mg total) by mouth 2 (two) times daily. Patient not taking: Reported on 10/21/2016 06/06/16  Alvira Monday, MD    Family History History reviewed. No pertinent family history.  Social History Social History  Substance Use Topics  . Smoking status: Current Every Day Smoker    Packs/day: 0.25    Types: Cigarettes  . Smokeless tobacco: Never Used  . Alcohol use No     Allergies   Patient has no known allergies.   Review of Systems Review of Systems  Gastrointestinal: Positive for abdominal pain and vomiting.  Neurological: Positive for weakness.  Psychiatric/Behavioral: Positive for confusion.  All other systems reviewed and are  negative.    Physical Exam Updated Vital Signs BP (!) 141/57   Pulse 76   Resp 16   SpO2 99%   Physical Exam  Constitutional:  Dehydrated, uncomfortable   HENT:  Head: Normocephalic.  MM dry   Eyes: Pupils are equal, round, and reactive to light. Conjunctivae and EOM are normal.  Neck: Normal range of motion. Neck supple.  Cardiovascular: Normal rate, regular rhythm and normal heart sounds.   Pulmonary/Chest: Effort normal and breath sounds normal. No respiratory distress. She has no wheezes.  Abdominal: Soft.  + epigastric tenderness, no rebound. No obvious pulsatile mass   Musculoskeletal: Normal range of motion.  Neurological:  Sleepy, but A & O x 3. Tired. Moving all extremities   Skin: Skin is warm.  Psychiatric:  Unable   Nursing note and vitals reviewed.    ED Treatments / Results  Labs (all labs ordered are listed, but only abnormal results are displayed) Labs Reviewed  CBC WITH DIFFERENTIAL/PLATELET - Abnormal; Notable for the following:       Result Value   HCT 34.1 (*)    Neutro Abs 7.8 (*)    All other components within normal limits  COMPREHENSIVE METABOLIC PANEL - Abnormal; Notable for the following:    Potassium 3.4 (*)    Chloride 120 (*)    CO2 16 (*)    Glucose, Bld 126 (*)    BUN 21 (*)    Creatinine, Ser 1.14 (*)    Calcium 8.1 (*)    Total Protein 5.2 (*)    Albumin 2.7 (*)    GFR calc non Af Amer 45 (*)    GFR calc Af Amer 52 (*)    All other components within normal limits  RAPID URINE DRUG SCREEN, HOSP PERFORMED - Abnormal; Notable for the following:    Benzodiazepines POSITIVE (*)    All other components within normal limits  URINALYSIS, ROUTINE W REFLEX MICROSCOPIC - Abnormal; Notable for the following:    Color, Urine AMBER (*)    Bilirubin Urine SMALL (*)    Ketones, ur 5 (*)    Protein, ur 100 (*)    All other components within normal limits  LIPASE, BLOOD - Abnormal; Notable for the following:    Lipase 103 (*)    All  other components within normal limits  I-STAT CG4 LACTIC ACID, ED - Abnormal; Notable for the following:    Lactic Acid, Venous 2.10 (*)    All other components within normal limits  CBG MONITORING, ED - Abnormal; Notable for the following:    Glucose-Capillary 158 (*)    All other components within normal limits  I-STAT CHEM 8, ED - Abnormal; Notable for the following:    Potassium 3.4 (*)    Chloride 118 (*)    BUN 25 (*)    Glucose, Bld 123 (*)    Calcium, Ion 1.13 (*)    Hemoglobin  11.9 (*)    HCT 35.0 (*)    All other components within normal limits  CULTURE, BLOOD (ROUTINE X 2)  CULTURE, BLOOD (ROUTINE X 2)  GASTROINTESTINAL PANEL BY PCR, STOOL (REPLACES STOOL CULTURE)  C DIFFICILE QUICK SCREEN W PCR REFLEX  I-STAT TROPONIN, ED  I-STAT CG4 LACTIC ACID, ED    EKG  EKG Interpretation  Date/Time:  Saturday October 21 2016 10:36:43 EDT Ventricular Rate:  78 PR Interval:    QRS Duration: 93 QT Interval:  408 QTC Calculation: 465 R Axis:   -64 Text Interpretation:  Sinus rhythm Inferior infarct, old No significant change since last tracing Confirmed by Richardean Canal [phone]) on 10/21/2016 10:41:26 AM       Radiology Ct Head Wo Contrast  Result Date: 10/21/2016 CLINICAL DATA:  Nausea, vomiting, and diarrhea beginning this morning, altered level of consciousness, pale, diaphoretic, hypotensive, syncopal episode purse patient, history hypertension and vertigo EXAM: CT HEAD WITHOUT CONTRAST TECHNIQUE: Contiguous axial images were obtained from the base of the skull through the vertex without intravenous contrast. Sagittal and coronal MPR images reconstructed from axial data set. COMPARISON:  06/13/2016 FINDINGS: Brain: Generalized atrophy. Normal ventricular morphology. No midline shift or mass effect. Mild small vessel chronic ischemic changes of deep cerebral white matter. Otherwise normal appearance of brain parenchyma. No intracranial hemorrhage, mass lesion or evidence of  acute infarction. No extra-axial fluid collections. Vascular: Atherosclerotic calcification of internal carotid arteries at skullbase Skull: Normal appearance Sinuses/Orbits: Clear Other: N/A IMPRESSION: Atrophy with small vessel chronic ischemic changes of deep cerebral white matter. No acute intracranial abnormalities. Electronically Signed   By: Ulyses Southward M.D.   On: 10/21/2016 14:06   Ct Abdomen Pelvis W Contrast  Result Date: 10/21/2016 CLINICAL DATA:  Nausea, vomiting and diarrhea beginning this morning. Syncopal episode. EXAM: CT ABDOMEN AND PELVIS WITH CONTRAST TECHNIQUE: Multidetector CT imaging of the abdomen and pelvis was performed using the standard protocol following bolus administration of intravenous contrast. CONTRAST:  ISOVUE-300 IOPAMIDOL (ISOVUE-300) INJECTION 61% COMPARISON:  06/06/2016; 07/03/2012; 03/20/2005 FINDINGS: Lower chest: Limited visualization of the lower thorax demonstrates minimal dependent subpleural ground-glass atelectasis within image right lower lobe. No focal airspace opacities. No pleural effusion. Normal heart size.  No pericardial effusion. Hepatobiliary: Normal hepatic contour. No discrete hepatic lesions. Post cholecystectomy. Common bile duct appears mildly dilated measuring approximately 8 mm in diameter with associated mild centralized intrahepatic biliary duct dilatation, likely the sequela of biliary reservoir phenomenon. No ascites. Pancreas: There is an apparent 1.4 cm hypoattenuating lesion within the caudal most aspect of the head of the pancreas (image 30, coronal image 69, series 6). This finding is without associated downstream pancreatic ductal dilatation or atrophy. Spleen: Normal appearance of the spleen Adrenals/Urinary Tract: There is symmetric enhancement and excretion of the bilateral kidneys. Bilateral punctate subcentimeter hypoattenuating renal lesions too small to adequately characterize though favored to represent renal cysts. No renal  stones. No urinary obstruction. Normal appearance the bilateral adrenal glands. Normal appearance of the urinary bladder given degree of distention. Stomach/Bowel: Large hiatal hernia. Extensive enteric wall thickening involving the gastric antrum (image 21, series 3 extending to involve the majority of the proximal small bowel with a relative mild sparing of the distal terminal ileum. There is fluid distention of the cecum and ascending colon with wall thickening involving the descending and sigmoid colon extending to the level the rectum. Scattered colonic diverticulosis without evidence of diverticulitis. Small enough fluid is seen within the lower abdomen without evidence  of perforation or definable/drainable fluid collection. No pneumoperitoneum, pneumatosis or portal venous gas. Vascular/Lymphatic: Moderate amount of eccentric mixed calcified and noncalcified atherosclerotic plaque with a normal caliber abdominal aorta. The branch vessels of the abdominal aorta appear patent on this non CTA examination. No bulky retroperitoneal, mesenteric, pelvic or inguinal lymphadenopathy. Reproductive: Potential vaginal prolapse. Post hysterectomy. No discrete adnexal lesion. Other: Regional soft tissues appear normal. Musculoskeletal: No acute or aggressive osseous abnormalities. Mild-to-moderate multilevel lumbar spine DDD, worse at L4-L5 and L5-S1 with disc space height loss, endplate irregularity and sclerosis. Moderate severe degenerative change the right hip with joint space loss, subchondral sclerosis and osteophytosis. IMPRESSION: 1. Rather extensive bowel wall thickening involving the gastric antrum, majority of the proximal small bowel as well as the descending and sigmoid colon to the level the rectum. Findings are nonspecific though given extent, may be of infectious etiology with additional differential and considerations including inflammatory and ischemic etiologies. Clinical correlation is advised. No  evidence of enteric obstruction or definable/drainable fluid collection. 2. Probable 1.4 cm cystic lesion within the uncinate process of the pancreas similar to the 2007 examination and thus of benign etiology. 3. Aortic Atherosclerosis (ICD10-I70.0). 4. Large hiatal hernia. Electronically Signed   By: Simonne Come M.D.   On: 10/21/2016 14:16   Dg Chest Port 1 View  Result Date: 10/21/2016 CLINICAL DATA:  Nausea, vomiting and diarrhea since this morning. Smoker. EXAM: PORTABLE CHEST 1 VIEW COMPARISON:  06/13/2016. FINDINGS: Normal sized heart. Tortuous aorta. Clear lungs with diffusely prominent interstitial markings. Normal vascularity. No pleural fluid. Diffuse osteopenia. Moderate right AC joint degenerative changes. IMPRESSION: No acute abnormality.  Mild chronic interstitial lung disease. Electronically Signed   By: Beckie Salts M.D.   On: 10/21/2016 12:39   Dg Abd Portable 1 View  Result Date: 10/21/2016 CLINICAL DATA:  Vomiting. EXAM: PORTABLE ABDOMEN - 1 VIEW COMPARISON:  Abdomen and pelvis CT dated 06/06/2016. FINDINGS: Paucity of intestinal gas. No visible dilated bowel loops. Cholecystectomy clips. Mild lumbar spine degenerative changes. Right hip degenerative changes. IMPRESSION: No acute abnormality. Electronically Signed   By: Beckie Salts M.D.   On: 10/21/2016 12:38    Procedures Procedures (including critical care time)  Medications Ordered in ED Medications  ciprofloxacin (CIPRO) IVPB 400 mg (not administered)  metroNIDAZOLE (FLAGYL) IVPB 500 mg (not administered)  sodium chloride 0.9 % bolus 1,000 mL (0 mLs Intravenous Stopped 10/21/16 1145)  ondansetron (ZOFRAN) injection 4 mg (4 mg Intravenous Given 10/21/16 1043)  sodium chloride 0.9 % bolus 1,000 mL (0 mLs Intravenous Stopped 10/21/16 1243)  promethazine (PHENERGAN) injection 12.5 mg (12.5 mg Intravenous Given 10/21/16 1243)  0.9 %  sodium chloride infusion ( Intravenous New Bag/Given 10/21/16 1243)  iopamidol (ISOVUE-300) 61 %  injection (100 mLs Intravenous Contrast Given 10/21/16 1320)     Initial Impression / Assessment and Plan / ED Course  I have reviewed the triage vital signs and the nursing notes.  Pertinent labs & imaging results that were available during my care of the patient were reviewed by me and considered in my medical decision making (see chart for details).     Kimberly Gregory is a 81 y.o. female here with AMS, vomiting. Was hypotensive en route but BP normal now. Consider gastro vs perforation vs sepsis from UTI. Will do sepsis workup, get CT ab/pel. Will likely need admission.   11:30 am Daughter at bedside and states that patient has been abusing laxatives at baseline. She found some dulcolax at the house. Patient  had multiple watery diarrhea in the ED and was changed multiple times. BP dropped to 80s again. Will give another 1 L NS bolus. Will try flexiseal given the amount of diarrhea. Ordered C diff, GI pathogen panel.   3 pm Patient's CT showed enteritis. Given cipro, flagyl. Hospitalist to admit for observation.    Final Clinical Impressions(s) / ED Diagnoses   Final diagnoses:  Vomiting and diarrhea  Dehydration  Enteritis    New Prescriptions New Prescriptions   No medications on file     Charlynne Pander, MD 10/21/16 1554
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
## 2016-10-21 NOTE — ED Triage Notes (Signed)
[facility]- pt coming from home complaint of n/v/d that began this morning. Pt initially altered with EMS, pale, diaphoretic, and hypotensive. Pt had 1500 cc of fluid PTA. VItal signs improved. Pt arrives AOX4.

## 2016-10-21 NOTE — H&P (Addendum)
TRH H&P   Patient Demographics:    Kimberly Gregory, is a 81 y.o. female  MRN: [id number]   DOB - [birth year]  Admit Date - 10/21/2016  Outpatient Primary MD for the patient is Ralene OkMoreira, Roy, MD  Referring MD Dr Silverio LayYao  Outpatient Specialists: none    Patient coming from: home  Chief Complaint  Patient presents with  . GI Problem      HPI:    Kimberly Gregory  is a 81 y.o. female, With mild dementia, hypertension, hyperlipidemia, anxiety and GERD with history of laxative abuse who has been hospitalized in the past for dizziness and UTI and symptoms associated with laxative abuse. She lives and takes care of her husband who has stroke. Since this morning she was having diffuse crampy abdominal pain with several episodes of nonbloody watery diarrhea. She also had nausea with several episodes of vomiting. Her husband called EMS after she was found to be very weak. EMS found patient to be very altered. CBG was normal but was hypotensive with blood pressure of 60/40 mmHg and give 1.5 L normal saline bolus after which her blood pressure improved to 110/60 mmHg when she came to the ED. Patient appeared more alert and oriented when she came to the ED. Patient reportedly was hypothermic with temperature of 20F and warm blankets applied. Blood work showed normal WBC, hemoglobin of 12, platelets of 317, sodium 143, potassium 3.4, chloride 120, CO2 of 16, BUN of 21 and creatinine 1.14. Glucose of 126 and lactic acid of 2.10. Patient was given 2 L IV normal saline bolus. In the ED she was having several episodes of watery diarrhea. She was given IV Zofran and Phenergan. Stool studies sent for C. difficile and GI pathogen panel. She was given empiric IV Rocephin and Flagyl.  Lipase was mildly elevated at 108.  Head CT was unremarkable. CT of the abdomen and pelvis was done which showed extensive bowel wall  thickening involving gastric antrum, majority of proximal small bowel along with descending and sigmoid colon up to the level of rectum.  Patient denies any fevers, chills, recent travel, eating anything outside, recent antibiotic use or change in her medications recently. He denies any chest pain, shortness of breath, dysuria, headache, dizziness, weakness or numbness of her extremities Daughter concerned that patient abuses laxatives frequently and similar symptoms have happened in the past. In April this year she was hospitalized with Xanax overdose.  Hospitalist admission requested on telemetry.    Review of systems:    History limited with patient's altered mental status and underlying dementia. In addition to the HPI above, No Fever-chills, No Headache, No changes with Vision or hearing, No problems swallowing food or Liquids, No Chest pain, Cough or Shortness of Breath, Abdominal pain, nausea and vomiting No Blood in stool or Urine, No dysuria, No new skin rashes or bruises, No new joints  pains-aches,  No new weakness, tingling, numbness in any extremity, No recent weight gain or loss, No polyuria, polydypsia or polyphagia, No significant Mental Stressors.  A full 10 point Review of Systems was done, except as stated above, all other Review of Systems were negative.   With Past History of the following :    Past Medical History:  Diagnosis Date  . Hypercholesterolemia   . Hypertension   . Vertigo 10/14/2014      Past Surgical History:  Procedure Laterality Date  . ABDOMINAL HYSTERECTOMY    . BLADDER SURGERY    . CATARACT EXTRACTION    . CHOLECYSTECTOMY    . KNEE SURGERY Right    arthroscopic      Social History:     Social History  Substance Use Topics  . Smoking status: Current Every Day Smoker    Packs/day: 0.25    Types: Cigarettes  . Smokeless tobacco: Never Used  . Alcohol use No     Lives - Home with husband  Mobility - independent      Family History :   No family history of heart disease, diabetes   Home Medications:   Prior to Admission medications   Medication Sig Start Date End Date Taking? Authorizing Provider  ALPRAZolam Prudy Feeler) 0.5 MG tablet Take 0.5 mg by mouth daily as needed for anxiety or sleep.  09/08/14  Yes [provider]  Ascorbic Acid (VITAMIN C) 100 MG tablet Take 100 mg by mouth daily.   Yes [provider]  cholecalciferol (VITAMIN D) 1000 units tablet Take 1,000 Units by mouth daily.   Yes [provider]  enalapril (VASOTEC) 5 MG tablet Take 1 tablet (5 mg total) by mouth daily. 10/26/14  Yes Richarda Overlie, MD  lovastatin (MEVACOR) 40 MG tablet Take 40 mg by mouth daily. 09/21/14  Yes [provider]  vitamin B-12 (CYANOCOBALAMIN) 250 MCG tablet Take 250 mcg by mouth daily.   Yes [provider]  diphenoxylate-atropine (LOMOTIL) 2.5-0.025 MG tablet Take 1 tablet by mouth 4 (four) times daily as needed for diarrhea or loose stools. Patient not taking: Reported on 01/05/2016 11/03/15   Rolland Porter, MD  ondansetron (ZOFRAN ODT) 4 MG disintegrating tablet Take 1 tablet (4 mg total) by mouth every 8 (eight) hours as needed for nausea. Patient not taking: Reported on 01/05/2016 11/03/15   Rolland Porter, MD  pantoprazole (PROTONIX) 20 MG tablet Take 1 tablet (20 mg total) by mouth 2 (two) times daily. Patient not taking: Reported on 10/21/2016 06/06/16   Alvira Monday, MD     Allergies:    No Known Allergies   Physical Exam:   Vitals  Blood pressure (!) 141/57, pulse 76, resp. rate 16, SpO2 99 %.   General: Elderly female lying in bed not in acute distress, HEENT: Pupils reactive bilaterally, EOMI, No pallor, dry oral mucosa, supple neck, no cervical lymphadenopathy Chest: Clear to auscultation bilaterally CVS: Normal S1 and S2, no murmurs rub or gallop GI: Soft, nondistended, bowel sounds present, mild diffuse abdominal tenderness Muscular skeletal: Warm,  no edema, skin normal. CNS: Alert and oriented 2, no focal neurological deficit on exam   Data Review:    CBC  Recent Labs Lab 10/21/16 1223 10/21/16 1236  WBC 9.7  --   HGB 12.0 11.9*  HCT 34.1* 35.0*  PLT 317  --   MCV 82.0  --   MCH 28.8  --   MCHC 35.2  --   RDW 14.7  --  LYMPHSABS 1.7  --   MONOABS 0.1  --   EOSABS 0.0  --   BASOSABS 0.0  --    ------------------------------------------------------------------------------------------------------------------  Chemistries   Recent Labs Lab 10/21/16 1223 10/21/16 1236  NA 143 145  K 3.4* 3.4*  CL 120* 118*  CO2 16*  --   GLUCOSE 126* 123*  BUN 21* 25*  CREATININE 1.14* 0.90  CALCIUM 8.1*  --   AST 31  --   ALT 17  --   ALKPHOS 48  --   BILITOT 1.0  --    ------------------------------------------------------------------------------------------------------------------ CrCl cannot be calculated (Unknown ideal weight.). ------------------------------------------------------------------------------------------------------------------ No results for input(s): TSH, T4TOTAL, T3FREE, THYROIDAB in the last 72 hours.  Invalid input(s): FREET3  Coagulation profile No results for input(s): INR, PROTIME in the last 168 hours. ------------------------------------------------------------------------------------------------------------------- No results for input(s): DDIMER in the last 72 hours. -------------------------------------------------------------------------------------------------------------------  Cardiac Enzymes No results for input(s): CKMB, TROPONINI, MYOGLOBIN in the last 168 hours.  Invalid input(s): CK ------------------------------------------------------------------------------------------------------------------ No results found for: BNP   ---------------------------------------------------------------------------------------------------------------  Urinalysis    Component Value  Date/Time   COLORURINE AMBER (A) 10/21/2016 1145   APPEARANCEUR CLEAR 10/21/2016 1145   LABSPEC 1.014 10/21/2016 1145   PHURINE 5.0 10/21/2016 1145   GLUCOSEU NEGATIVE 10/21/2016 1145   HGBUR NEGATIVE 10/21/2016 1145   BILIRUBINUR SMALL (A) 10/21/2016 1145   KETONESUR 5 (A) 10/21/2016 1145   PROTEINUR 100 (A) 10/21/2016 1145   UROBILINOGEN 1.0 10/14/2014 0745   NITRITE NEGATIVE 10/21/2016 1145   LEUKOCYTESUR NEGATIVE 10/21/2016 1145    ----------------------------------------------------------------------------------------------------------------   Imaging Results:    Ct Head Wo Contrast  Result Date: 10/21/2016 CLINICAL DATA:  Nausea, vomiting, and diarrhea beginning this morning, altered level of consciousness, pale, diaphoretic, hypotensive, syncopal episode purse patient, history hypertension and vertigo EXAM: CT HEAD WITHOUT CONTRAST TECHNIQUE: Contiguous axial images were obtained from the base of the skull through the vertex without intravenous contrast. Sagittal and coronal MPR images reconstructed from axial data set. COMPARISON:  06/13/2016 FINDINGS: Brain: Generalized atrophy. Normal ventricular morphology. No midline shift or mass effect. Mild small vessel chronic ischemic changes of deep cerebral white matter. Otherwise normal appearance of brain parenchyma. No intracranial hemorrhage, mass lesion or evidence of acute infarction. No extra-axial fluid collections. Vascular: Atherosclerotic calcification of internal carotid arteries at skullbase Skull: Normal appearance Sinuses/Orbits: Clear Other: N/A IMPRESSION: Atrophy with small vessel chronic ischemic changes of deep cerebral white matter. No acute intracranial abnormalities. Electronically Signed   By: Ulyses Southward M.D.   On: 10/21/2016 14:06   Ct Abdomen Pelvis W Contrast  Result Date: 10/21/2016 CLINICAL DATA:  Nausea, vomiting and diarrhea beginning this morning. Syncopal episode. EXAM: CT ABDOMEN AND PELVIS WITH CONTRAST  TECHNIQUE: Multidetector CT imaging of the abdomen and pelvis was performed using the standard protocol following bolus administration of intravenous contrast. CONTRAST:  ISOVUE-300 IOPAMIDOL (ISOVUE-300) INJECTION 61% COMPARISON:  06/06/2016; 07/03/2012; 03/20/2005 FINDINGS: Lower chest: Limited visualization of the lower thorax demonstrates minimal dependent subpleural ground-glass atelectasis within image right lower lobe. No focal airspace opacities. No pleural effusion. Normal heart size.  No pericardial effusion. Hepatobiliary: Normal hepatic contour. No discrete hepatic lesions. Post cholecystectomy. Common bile duct appears mildly dilated measuring approximately 8 mm in diameter with associated mild centralized intrahepatic biliary duct dilatation, likely the sequela of biliary reservoir phenomenon. No ascites. Pancreas: There is an apparent 1.4 cm hypoattenuating lesion within the caudal most aspect of the head of the pancreas (image 30, coronal image 69, series 6).  This finding is without associated downstream pancreatic ductal dilatation or atrophy. Spleen: Normal appearance of the spleen Adrenals/Urinary Tract: There is symmetric enhancement and excretion of the bilateral kidneys. Bilateral punctate subcentimeter hypoattenuating renal lesions too small to adequately characterize though favored to represent renal cysts. No renal stones. No urinary obstruction. Normal appearance the bilateral adrenal glands. Normal appearance of the urinary bladder given degree of distention. Stomach/Bowel: Large hiatal hernia. Extensive enteric wall thickening involving the gastric antrum (image 21, series 3 extending to involve the majority of the proximal small bowel with a relative mild sparing of the distal terminal ileum. There is fluid distention of the cecum and ascending colon with wall thickening involving the descending and sigmoid colon extending to the level the rectum. Scattered colonic diverticulosis  without evidence of diverticulitis. Small enough fluid is seen within the lower abdomen without evidence of perforation or definable/drainable fluid collection. No pneumoperitoneum, pneumatosis or portal venous gas. Vascular/Lymphatic: Moderate amount of eccentric mixed calcified and noncalcified atherosclerotic plaque with a normal caliber abdominal aorta. The branch vessels of the abdominal aorta appear patent on this non CTA examination. No bulky retroperitoneal, mesenteric, pelvic or inguinal lymphadenopathy. Reproductive: Potential vaginal prolapse. Post hysterectomy. No discrete adnexal lesion. Other: Regional soft tissues appear normal. Musculoskeletal: No acute or aggressive osseous abnormalities. Mild-to-moderate multilevel lumbar spine DDD, worse at L4-L5 and L5-S1 with disc space height loss, endplate irregularity and sclerosis. Moderate severe degenerative change the right hip with joint space loss, subchondral sclerosis and osteophytosis. IMPRESSION: 1. Rather extensive bowel wall thickening involving the gastric antrum, majority of the proximal small bowel as well as the descending and sigmoid colon to the level the rectum. Findings are nonspecific though given extent, may be of infectious etiology with additional differential and considerations including inflammatory and ischemic etiologies. Clinical correlation is advised. No evidence of enteric obstruction or definable/drainable fluid collection. 2. Probable 1.4 cm cystic lesion within the uncinate process of the pancreas similar to the 2007 examination and thus of benign etiology. 3. Aortic Atherosclerosis (ICD10-I70.0). 4. Large hiatal hernia. Electronically Signed   By: Simonne Come M.D.   On: 10/21/2016 14:16   Dg Chest Port 1 View  Result Date: 10/21/2016 CLINICAL DATA:  Nausea, vomiting and diarrhea since this morning. Smoker. EXAM: PORTABLE CHEST 1 VIEW COMPARISON:  06/13/2016. FINDINGS: Normal sized heart. Tortuous aorta. Clear lungs with  diffusely prominent interstitial markings. Normal vascularity. No pleural fluid. Diffuse osteopenia. Moderate right AC joint degenerative changes. IMPRESSION: No acute abnormality.  Mild chronic interstitial lung disease. Electronically Signed   By: Beckie Salts M.D.   On: 10/21/2016 12:39   Dg Abd Portable 1 View  Result Date: 10/21/2016 CLINICAL DATA:  Vomiting. EXAM: PORTABLE ABDOMEN - 1 VIEW COMPARISON:  Abdomen and pelvis CT dated 06/06/2016. FINDINGS: Paucity of intestinal gas. No visible dilated bowel loops. Cholecystectomy clips. Mild lumbar spine degenerative changes. Right hip degenerative changes. IMPRESSION: No acute abnormality. Electronically Signed   By: Beckie Salts M.D.   On: 10/21/2016 12:38    My personal review of EKG: Normal sinus rhythm at 78, no ST-T changes   Assessment & Plan:    Principal Problem:   SIRS (systemic inflammatory response syndrome) (HCC) Suspected to diffuse acute colitis. Admit to telemetry. Patient hypothermic in the ED requiring warm blankets. Check random cortisol and blood culture. Place on empiric IV Cipro and Flagyl. Follow stool for C. difficile and GI pathogen panel. -Supportive care with IV normal saline with potassium supplement, antiemetics, tylenol prn for  pain ( hold off on narcotics given her encephalopathy)  Active Problems:   Acute enteritis/colitis    suspect infectious versus inflammatory. Empiric antibiotics, IV fluids, antiemetics. Place on IV Protonix twice a day. Add imodium of cdiff negative. She had colonoscopy in 2005 showing diverticulitis. GI consult if symptoms worsen otherwise can have outpatient evaluation.   Acute metabolic encephalopathy Mental status creating an close to baseline now. Suspect secondary to acute illness and dehydration. Urine drug screen negative. UA negative for infection. Follow blood culture and TSH.   hypotension Secondary to acute gastroenteritis and severe dehydration. Monitor with IV fluids.  Hold ARB.   hypokalemia Replenish. Check magnesium  Hypothermia Possibly due to acute infection. Check blood culture, TSH and random cortisone   Laxative abuse. Daughter concerned that patient actually the pharmacy and buy laxatives and abuses frequently. Patient has refused home health RN in the past.  Hyperlipidemia Continue statin.     DVT Prophylaxis : Lovenox   AM Labs Ordered, also please review Full Orders  Family Communication: Admission, patients condition and plan of care including tests being ordered have been discussed with the patient andHer daughter at bedside  Likely DC to  home  Condition GUARDED  Consults called: none admission status: inpatient  Time spent in minutes : 60   Eddie North M.D on 10/21/2016 at 3:27 PM  Between 7am to 7pm - Pager - [phone]. After 7pm go to [email or web address] - password [facility]  [facility] - Office  [phone]

## 2016-10-21 NOTE — Progress Notes (Signed)
Pt had run of SVT up to 150's. Pt is asymptomatic. This RN notified the MD.   Leonia ReevesNatalie N Adelena Desantiago, RN

## 2016-10-22 ENCOUNTER — Encounter (HOSPITAL_COMMUNITY): Payer: Self-pay | Admitting: Internal Medicine

## 2016-10-22 DIAGNOSIS — R933 Abnormal findings on diagnostic imaging of other parts of digestive tract: Secondary | ICD-10-CM

## 2016-10-22 DIAGNOSIS — R1084 Generalized abdominal pain: Secondary | ICD-10-CM

## 2016-10-22 DIAGNOSIS — K922 Gastrointestinal hemorrhage, unspecified: Secondary | ICD-10-CM

## 2016-10-22 DIAGNOSIS — R197 Diarrhea, unspecified: Secondary | ICD-10-CM

## 2016-10-22 DIAGNOSIS — R112 Nausea with vomiting, unspecified: Secondary | ICD-10-CM

## 2016-10-22 DIAGNOSIS — D649 Anemia, unspecified: Secondary | ICD-10-CM

## 2016-10-22 LAB — GASTROINTESTINAL PANEL BY PCR, STOOL (REPLACES STOOL CULTURE)

## 2016-10-22 LAB — BASIC METABOLIC PANEL
Anion gap: 4 — ABNORMAL LOW (ref 5–15)
BUN: 20 mg/dL (ref 6–20)
CALCIUM: 7.5 mg/dL — AB (ref 8.9–10.3)
CHLORIDE: 120 mmol/L — AB (ref 101–111)
CO2: 18 mmol/L — ABNORMAL LOW (ref 22–32)
CREATININE: 1.12 mg/dL — AB (ref 0.44–1.00)
GFR calc non Af Amer: 45 mL/min — ABNORMAL LOW (ref >60)
GFR, EST AFRICAN AMERICAN: 53 mL/min — AB (ref >60)
Glucose, Bld: 99 mg/dL (ref 65–99)
Potassium: 4.1 mmol/L (ref 3.5–5.1)
SODIUM: 142 mmol/L (ref 135–145)

## 2016-10-22 LAB — CBC
HCT: 27.7 % — ABNORMAL LOW (ref 36.0–46.0)
Hemoglobin: 9.8 g/dL — ABNORMAL LOW (ref 12.0–15.0)
MCH: 28.7 pg (ref 26.0–34.0)
MCHC: 35.4 g/dL (ref 30.0–36.0)
MCV: 81 fL (ref 78.0–100.0)
PLATELETS: 278 10*3/uL (ref 150–400)
RBC: 3.42 MIL/uL — AB (ref 3.87–5.11)
RDW: 15.1 % (ref 11.5–15.5)
WBC: 11.1 10*3/uL — AB (ref 4.0–10.5)

## 2016-10-22 LAB — PROTIME-INR
INR: 1.42
PROTHROMBIN TIME: 17.5 s — AB (ref 11.4–15.2)

## 2016-10-22 LAB — LACTIC ACID, PLASMA: LACTIC ACID, VENOUS: 0.9 mmol/L (ref 0.5–1.9)

## 2016-10-22 LAB — APTT: aPTT: 45 seconds — ABNORMAL HIGH (ref 24–36)

## 2016-10-22 LAB — C DIFFICILE QUICK SCREEN W PCR REFLEX
C DIFFICILE (CDIFF) TOXIN: NEGATIVE
C DIFFICLE (CDIFF) ANTIGEN: NEGATIVE
C Diff interpretation: NOT DETECTED

## 2016-10-22 LAB — HEMOGLOBIN AND HEMATOCRIT, BLOOD
HCT: 24.7 % — ABNORMAL LOW (ref 36.0–46.0)
HEMATOCRIT: 26.6 % — AB (ref 36.0–46.0)
Hemoglobin: 9.4 g/dL — ABNORMAL LOW (ref 12.0–15.0)
Hemoglobin: 8.7 g/dL — ABNORMAL LOW (ref 12.0–15.0)

## 2016-10-22 MED ORDER — POTASSIUM CHLORIDE IN NACL 40-0.9 MEQ/L-% IV SOLN
INTRAVENOUS | Status: DC
  Administered 2016-10-22 (×2): 125 mL/h via INTRAVENOUS
  Filled 2016-10-22 (×5): qty 1000

## 2016-10-22 NOTE — Progress Notes (Signed)
Messaged MD regarding blood in stool with clots. Stool was liquid and a maroon color. MD wrote order to d/c lovenox, start SCD, and check CBC. Will continue to monitor.   Larey Dayshristy M Krysta Bloomfield, RN

## 2016-10-22 NOTE — Progress Notes (Signed)
PROGRESS NOTE                                                                                                                                                                                                             Patient Demographics:    Kimberly Gregory, is a 81 y.o. female, DOB - October 18, 1935, ZOX:[id number]  Admit date - 10/21/2016   Admitting Physician Eddie NorthNishant Erlean Mealor, MD  Outpatient Primary MD for the patient is Ralene OkMoreira, Roy, MD  LOS - 1  Outpatient Specialists:None  Chief Complaint  Patient presents with  . GI Problem       Brief Narrative   81 year old female with history of hypertension, hyperlipidemia, anxiety, GERD, history of laxative abuse, prior hospitalization for dizziness and UTI brought to the ED with diffuse abdominal cramping associated with several episodes of nonbloody watery diarrhea, nausea with several episodes of nonbloody emesis on the day of admission. EMS found patient to be hypotensive with blood pressure of 60/40 mmHg. In the ED she was hypothermic with temperature of 94 F and blood work showed normal CBC, hypokalemia, non-and an gap metabolic acidosis and mildly elevated lactic acid. CT of the abdomen showed extensive bowel wall thickening extending from stomach to distal colon. Patient admitted to hospitalist service with supportive care and empiric antibiotics.   Subjective:   Patient had 2 episodes of bowel movement mixed with stool overnight. Stool for for Hemoccult was positive. Nausea and vomiting resolved. Diarrhea and abdominal pain improved.   Assessment  & Plan :    Principal Problem:   SIRS (systemic inflammatory response syndrome) (HCC) Secondary to acute diffuse enteritis/colitis. GI consult appreciated suspect this could be either infectious or ACE inhibitor angioedema. Recommend supportive care, empiric antibiotics, clear liquid, IV fluids. Recommend checking C4 level and if  it low ejection 1 inhibitor function and protein levels. Repeat lactic acid.   Active Problems: GI bleed Secondary to infection versus ischemic colitis. Serial H&H monitoring and transfuse as needed.  Diarrhea Significantly improved. Stool for C. difficile negative. Follow GI pathogen panel.  Hypertension Enalapril discontinued due to hypotension and suspicion for ACE inhibitor angioedema causing colitis.    Acute metabolic encephalopathy Secondary to acute illness and dehydration. Now at baseline.    Hypokalemia Replenish    Non-anion gap Metabolic acidosis Secondary to GI loss. Improved with fluids  Hyperlipidemia  Continue statin.  Laxative abuse Patient continues to decline.    Code Status : Full code  Family Communication  : Daughter at bedside  Disposition Plan  : Home pending hospital course  Barriers For Discharge : Active symptoms  Consults  : [facility]  Procedures  : CT abdomen and pelvis  DVT Prophylaxis  :  SCDs  Lab Results  Component Value Date   PLT 278 10/22/2016    Antibiotics  :    Anti-infectives    Start     Dose/Rate Route Frequency Ordered Stop   10/21/16 1800  ciprofloxacin (CIPRO) IVPB 400 mg     400 mg 200 mL/hr over 60 Minutes Intravenous Every 12 hours 10/21/16 1623     10/21/16 1800  metroNIDAZOLE (FLAGYL) IVPB 500 mg     500 mg 100 mL/hr over 60 Minutes Intravenous Every 8 hours 10/21/16 1623     10/21/16 1445  ciprofloxacin (CIPRO) IVPB 400 mg     400 mg 200 mL/hr over 60 Minutes Intravenous  Once 10/21/16 1436     10/21/16 1445  metroNIDAZOLE (FLAGYL) IVPB 500 mg     500 mg 100 mL/hr over 60 Minutes Intravenous  Once 10/21/16 1436          Objective:   Vitals:   10/21/16 1515 10/21/16 1614 10/21/16 2143 10/22/16 0855  BP: (!) 141/57 135/60 124/62 132/65  Pulse: 76 80 84 80  Resp: 16 16 16 16   Temp:  98.4 F (36.9 C) 98.7 F (37.1 C) 98.4 F (36.9 C)  TempSrc:  Oral Oral Oral  SpO2: 99% 98% 99% 99%  Weight:   63.2 kg (139 lb 4.8 oz) 64.9 kg (143 lb)   Height:  5\' 5"  (1.651 m)      Wt Readings from Last 3 Encounters:  10/21/16 64.9 kg (143 lb)  06/13/16 61.5 kg (135 lb 9.3 oz)  06/06/16 62.6 kg (138 lb)     Intake/Output Summary (Last 24 hours) at 10/22/16 1329 Last data filed at 10/22/16 0855  Gross per 24 hour  Intake          1127.08 ml  Output                4 ml  Net          1123.08 ml     Physical Exam  Gen: not in distress HEENT: moist mucosa, supple neck Chest: clear b/l, no added sounds CVS: N S1&S2, no murmurs, GI: soft, Distended, mild lower abdominal tenderness, bowel sounds present Musculoskeletal: warm, no edema     Data Review:    CBC  Recent Labs Lab 10/21/16 1223 10/21/16 1236 10/22/16 0428 10/22/16 1222  WBC 9.7  --  11.1*  --   HGB 12.0 11.9* 9.8* 9.4*  HCT 34.1* 35.0* 27.7* 26.6*  PLT 317  --  278  --   MCV 82.0  --  81.0  --   MCH 28.8  --  28.7  --   MCHC 35.2  --  35.4  --   RDW 14.7  --  15.1  --   LYMPHSABS 1.7  --   --   --   MONOABS 0.1  --   --   --   EOSABS 0.0  --   --   --   BASOSABS 0.0  --   --   --     Chemistries   Recent Labs Lab 10/21/16 1223 10/21/16 1236 10/22/16 0428  NA 143  145 142  K 3.4* 3.4* 4.1  CL 120* 118* 120*  CO2 16*  --  18*  GLUCOSE 126* 123* 99  BUN 21* 25* 20  CREATININE 1.14* 0.90 1.12*  CALCIUM 8.1*  --  7.5*  AST 31  --   --   ALT 17  --   --   ALKPHOS 48  --   --   BILITOT 1.0  --   --    ------------------------------------------------------------------------------------------------------------------ No results for input(s): CHOL, HDL, LDLCALC, TRIG, CHOLHDL, LDLDIRECT in the last 72 hours.  No results found for: HGBA1C ------------------------------------------------------------------------------------------------------------------  Recent Labs  10/21/16 1619  TSH 1.023    ------------------------------------------------------------------------------------------------------------------ No results for input(s): VITAMINB12, FOLATE, FERRITIN, TIBC, IRON, RETICCTPCT in the last 72 hours.  Coagulation profile No results for input(s): INR, PROTIME in the last 168 hours.  No results for input(s): DDIMER in the last 72 hours.  Cardiac Enzymes No results for input(s): CKMB, TROPONINI, MYOGLOBIN in the last 168 hours.  Invalid input(s): CK ------------------------------------------------------------------------------------------------------------------ No results found for: BNP  Inpatient Medications  Scheduled Meds: . ascorbic acid  100 mg Oral Daily  . cholecalciferol  1,000 Units Oral Daily  . pantoprazole (PROTONIX) IV  40 mg Intravenous Q12H  . pravastatin  20 mg Oral q1800  . vitamin B-12  250 mcg Oral Daily   Continuous Infusions: . 0.9 % NaCl with KCl 40 mEq / L 125 mL/hr (10/22/16 1305)  . ciprofloxacin    . ciprofloxacin Stopped (10/22/16 0853)  . metronidazole    . metronidazole Stopped (10/22/16 1006)   PRN Meds:.acetaminophen **OR** acetaminophen, ALPRAZolam, ondansetron **OR** ondansetron (ZOFRAN) IV  Micro Results Recent Results (from the past 240 hour(s))  C difficile quick scan w PCR reflex     Status: None   Collection Time: 10/21/16 11:30 AM  Result Value Ref Range Status   C Diff antigen NEGATIVE NEGATIVE Final   C Diff toxin NEGATIVE NEGATIVE Final   C Diff interpretation No C. difficile detected.  Final  Blood culture (routine x 2)     Status: None (Preliminary result)   Collection Time: 10/21/16 12:19 PM  Result Value Ref Range Status   Specimen Description BLOOD LEFT ANTECUBITAL  Final   Special Requests   Final    BOTTLES DRAWN AEROBIC AND ANAEROBIC Blood Culture adequate volume   Culture NO GROWTH < 24 HOURS  Final   Report Status PENDING  Incomplete  Blood culture (routine x 2)     Status: None (Preliminary result)    Collection Time: 10/21/16 12:27 PM  Result Value Ref Range Status   Specimen Description BLOOD RIGHT HAND  Final   Special Requests   Final    BOTTLES DRAWN AEROBIC ONLY Blood Culture adequate volume   Culture NO GROWTH < 24 HOURS  Final   Report Status PENDING  Incomplete    Radiology Reports Ct Head Wo Contrast  Result Date: 10/21/2016 CLINICAL DATA:  Nausea, vomiting, and diarrhea beginning this morning, altered level of consciousness, pale, diaphoretic, hypotensive, syncopal episode purse patient, history hypertension and vertigo EXAM: CT HEAD WITHOUT CONTRAST TECHNIQUE: Contiguous axial images were obtained from the base of the skull through the vertex without intravenous contrast. Sagittal and coronal MPR images reconstructed from axial data set. COMPARISON:  06/13/2016 FINDINGS: Brain: Generalized atrophy. Normal ventricular morphology. No midline shift or mass effect. Mild small vessel chronic ischemic changes of deep cerebral white matter. Otherwise normal appearance of brain parenchyma. No intracranial hemorrhage, mass lesion or evidence  of acute infarction. No extra-axial fluid collections. Vascular: Atherosclerotic calcification of internal carotid arteries at skullbase Skull: Normal appearance Sinuses/Orbits: Clear Other: N/A IMPRESSION: Atrophy with small vessel chronic ischemic changes of deep cerebral white matter. No acute intracranial abnormalities. Electronically Signed   By: Ulyses Southward M.D.   On: 10/21/2016 14:06   Ct Abdomen Pelvis W Contrast  Result Date: 10/21/2016 CLINICAL DATA:  Nausea, vomiting and diarrhea beginning this morning. Syncopal episode. EXAM: CT ABDOMEN AND PELVIS WITH CONTRAST TECHNIQUE: Multidetector CT imaging of the abdomen and pelvis was performed using the standard protocol following bolus administration of intravenous contrast. CONTRAST:  ISOVUE-300 IOPAMIDOL (ISOVUE-300) INJECTION 61% COMPARISON:  06/06/2016; 07/03/2012; 03/20/2005 FINDINGS:  Lower chest: Limited visualization of the lower thorax demonstrates minimal dependent subpleural ground-glass atelectasis within image right lower lobe. No focal airspace opacities. No pleural effusion. Normal heart size.  No pericardial effusion. Hepatobiliary: Normal hepatic contour. No discrete hepatic lesions. Post cholecystectomy. Common bile duct appears mildly dilated measuring approximately 8 mm in diameter with associated mild centralized intrahepatic biliary duct dilatation, likely the sequela of biliary reservoir phenomenon. No ascites. Pancreas: There is an apparent 1.4 cm hypoattenuating lesion within the caudal most aspect of the head of the pancreas (image 30, coronal image 69, series 6). This finding is without associated downstream pancreatic ductal dilatation or atrophy. Spleen: Normal appearance of the spleen Adrenals/Urinary Tract: There is symmetric enhancement and excretion of the bilateral kidneys. Bilateral punctate subcentimeter hypoattenuating renal lesions too small to adequately characterize though favored to represent renal cysts. No renal stones. No urinary obstruction. Normal appearance the bilateral adrenal glands. Normal appearance of the urinary bladder given degree of distention. Stomach/Bowel: Large hiatal hernia. Extensive enteric wall thickening involving the gastric antrum (image 21, series 3 extending to involve the majority of the proximal small bowel with a relative mild sparing of the distal terminal ileum. There is fluid distention of the cecum and ascending colon with wall thickening involving the descending and sigmoid colon extending to the level the rectum. Scattered colonic diverticulosis without evidence of diverticulitis. Small enough fluid is seen within the lower abdomen without evidence of perforation or definable/drainable fluid collection. No pneumoperitoneum, pneumatosis or portal venous gas. Vascular/Lymphatic: Moderate amount of eccentric mixed calcified and  noncalcified atherosclerotic plaque with a normal caliber abdominal aorta. The branch vessels of the abdominal aorta appear patent on this non CTA examination. No bulky retroperitoneal, mesenteric, pelvic or inguinal lymphadenopathy. Reproductive: Potential vaginal prolapse. Post hysterectomy. No discrete adnexal lesion. Other: Regional soft tissues appear normal. Musculoskeletal: No acute or aggressive osseous abnormalities. Mild-to-moderate multilevel lumbar spine DDD, worse at L4-L5 and L5-S1 with disc space height loss, endplate irregularity and sclerosis. Moderate severe degenerative change the right hip with joint space loss, subchondral sclerosis and osteophytosis. IMPRESSION: 1. Rather extensive bowel wall thickening involving the gastric antrum, majority of the proximal small bowel as well as the descending and sigmoid colon to the level the rectum. Findings are nonspecific though given extent, may be of infectious etiology with additional differential and considerations including inflammatory and ischemic etiologies. Clinical correlation is advised. No evidence of enteric obstruction or definable/drainable fluid collection. 2. Probable 1.4 cm cystic lesion within the uncinate process of the pancreas similar to the 2007 examination and thus of benign etiology. 3. Aortic Atherosclerosis (ICD10-I70.0). 4. Large hiatal hernia. Electronically Signed   By: Simonne Come M.D.   On: 10/21/2016 14:16   Dg Chest Port 1 View  Result Date: 10/21/2016 CLINICAL DATA:  Nausea,  vomiting and diarrhea since this morning. Smoker. EXAM: PORTABLE CHEST 1 VIEW COMPARISON:  06/13/2016. FINDINGS: Normal sized heart. Tortuous aorta. Clear lungs with diffusely prominent interstitial markings. Normal vascularity. No pleural fluid. Diffuse osteopenia. Moderate right AC joint degenerative changes. IMPRESSION: No acute abnormality.  Mild chronic interstitial lung disease. Electronically Signed   By: Beckie Salts M.D.   On: 10/21/2016  12:39   Dg Abd Portable 1 View  Result Date: 10/21/2016 CLINICAL DATA:  Vomiting. EXAM: PORTABLE ABDOMEN - 1 VIEW COMPARISON:  Abdomen and pelvis CT dated 06/06/2016. FINDINGS: Paucity of intestinal gas. No visible dilated bowel loops. Cholecystectomy clips. Mild lumbar spine degenerative changes. Right hip degenerative changes. IMPRESSION: No acute abnormality. Electronically Signed   By: Beckie Salts M.D.   On: 10/21/2016 12:38    Time Spent in minutes  25   Eddie North M.D on 10/22/2016 at 1:29 PM  Between 7am to 7pm - Pager - [phone]  After 7pm go to [email or web address] - password [email or web address]  [facility] -  Office  [phone]

## 2016-10-22 NOTE — Consult Note (Addendum)
Consultation  Referring Provider: [facility] hospitalist/Dhungel Primary Care Physician:  Ralene Ok, MD Primary Gastroenterologist:  Dr.Stark  Reason for Consultation:  Acute abdominal  pain, hypotension, abnormal CT   IMPRESSION:  #65 81 year old white female with acute abdominal pain, diaphoresis, nausea vomiting and diarrhea complicated by altered mental status and hypotension. CT of the abdomen and pelvis on admission showing extensive wall thickening from the gastric antrum throughout the majority of the small bowel with relative sparing of the TI and also wall thickening of the left colon to the rectum. Elevated lactic acid on admission Onset of bloody stool about 12 hours after her acute event  #2 anemia secondary to above #3 history of hypertension-on Vasotec at home #4 history of hyperlipidemia #5 history chronic kidney disease #6 chronic vertigo #7 dementia #8 diverticulosis  Plan: Continue supportive management with IV fluids, IV Cipro and IV Flagyl for now C. difficile quick scan negative await GI pathogen panel Serial hemoglobins and transfuse as needed for hemoglobin 8 or less Repeat lactic acid. Sips of clears   Will follow with you.   Amy Esterwood PA-C  10/22/2016, 1:11 PM      [facility] Attending   I have taken an interval history, reviewed the chart and examined the patient. I agree with the Advanced Practitioner's note, impression and recommendations.   Somewhat unusual situation with GI edema from stomach to colon with some distal small bowel sparing. The distribution makes me think this is not an occlusive vasc I think 2 main possibilities are infection and AcE inhibitor angioedema.  She may have had some ischemia from volume loss and hypotension leading to the current bleeding - as the other 2 diagnoses with distribution of bowel edema would not make me think of a bloody illness.  She is improving with supportive care.  I checked a C4 level -  if that is low then would check C1 inhibitor function and proteinlevels and a C1q level - I suspect it won't be low.  I do think her CT findings fit with angioedema. This problem can occur at any point in Tx with an ACE inhibitor. No hx of angioedema elsewhere but not necessary to dx and she is demented. I would change her BP medication to a different class unless we turn up an infection.  May never really know what caused this. I do not think endoscopic evaluation necessary but will see what happens with clinical course.  I appreciate the opportunity to care for this patient.  Iva Boop, MD, [facility] [phone] (pager) 10/22/2016 1:22 PM       HPI: JULIEANA ESHLEMAN is a 81 y.o. female known remotely to Dr. Russella Dar from prior colonoscopy in 2005 who was admitted yesterday morning after being brought in by EMS after acute onset of abdominal pain nausea vomiting and diarrhea at home with associated altered mental status, diaphoresis and hypotension. She apparently had blood pressure of 60/40 per EMS with initial evaluation. She was fluid bolus and received additional fluids in the ER. Patient does not have much memory of the events. She remembers being sick and having abdominal pain vomiting and diarrhea that has her times mixed up currently. She says she had been feeling fine until acute onset of symptoms. She denies any recent changes in medications, new medications, laxatives etc. On admit WBC 9.7, hemoglobin 12, potassium 3.4, creatinine 1.14 and lactic acid 2.1. CT of the abdomen and pelvis with contrast showed a 1.4 cm cystic lesion  in the head of the pancreas (chronic) present since 2007), large hiatal hernia, and extensive wall thickening from the gastric antrum throughout the majority of the small bowel or thrill attentive sparing of the TI and also with wall thickening in the left colon down to the rectum. The aorta and branch vessels appear patent. Hypotension  has resolved. She began passing blood with stool last evening and hemoglobin down to 9.8 this morning. Today her primary complaint is headache which she says she has chronically. She's not having any significant abdominal pain and denies nausea.  Past history pertinent for hypertension, hyperlipidemia, vertigo, mild dementia, previous concerns regarding laxative abuse.  Last colonoscopy 2005 Russella Dar) showed sigmoid diverticulosis and internal hemorrhoids, no polyps.   Past Medical History:  Diagnosis Date  . Arthritis   . Chronic kidney disease   . Dementia   . Headache   . Hypercholesterolemia   . Hypertension   . Vertigo 10/14/2014    Past Surgical History:  Procedure Laterality Date  . ABDOMINAL HYSTERECTOMY    . BLADDER SURGERY    . CATARACT EXTRACTION    . CHOLECYSTECTOMY    . EYE SURGERY    . KNEE SURGERY Right    arthroscopic    Prior to Admission medications   Medication Sig Start Date End Date Taking? Authorizing Provider  ALPRAZolam Prudy Feeler) 0.5 MG tablet Take 0.5 mg by mouth daily as needed for anxiety or sleep.  09/08/14  Yes [provider]  Ascorbic Acid (VITAMIN C) 100 MG tablet Take 100 mg by mouth daily.   Yes [provider]  cholecalciferol (VITAMIN D) 1000 units tablet Take 1,000 Units by mouth daily.   Yes [provider]  enalapril (VASOTEC) 5 MG tablet Take 1 tablet (5 mg total) by mouth daily. 10/26/14  Yes Richarda Overlie, MD  lovastatin (MEVACOR) 40 MG tablet Take 40 mg by mouth daily. 09/21/14  Yes [provider]  vitamin B-12 (CYANOCOBALAMIN) 250 MCG tablet Take 250 mcg by mouth daily.   Yes [provider]  diphenoxylate-atropine (LOMOTIL) 2.5-0.025 MG tablet Take 1 tablet by mouth 4 (four) times daily as needed for diarrhea or loose stools. Patient not taking: Reported on 01/05/2016 11/03/15   Rolland Porter, MD  ondansetron (ZOFRAN ODT) 4 MG disintegrating tablet Take 1 tablet (4 mg total) by mouth every 8  (eight) hours as needed for nausea. Patient not taking: Reported on 01/05/2016 11/03/15   Rolland Porter, MD  pantoprazole (PROTONIX) 20 MG tablet Take 1 tablet (20 mg total) by mouth 2 (two) times daily. Patient not taking: Reported on 10/21/2016 06/06/16   Alvira Monday, MD    Current Facility-Administered Medications  Medication Dose Route Frequency Provider Last Rate Last Dose  . 0.9 % NaCl with KCl 40 mEq / L  infusion   Intravenous Continuous Dhungel, Nishant, MD 125 mL/hr at 10/22/16 1305 125 mL/hr at 10/22/16 1305  . acetaminophen (TYLENOL) tablet 650 mg  650 mg Oral Q6H PRN Dhungel, Nishant, MD   650 mg at 10/21/16 1847   Or  . acetaminophen (TYLENOL) suppository 650 mg  650 mg Rectal Q6H PRN Dhungel, Nishant, MD      . ALPRAZolam Prudy Feeler) tablet 0.5 mg  0.5 mg Oral Daily PRN Dhungel, Nishant, MD      . ascorbic acid (VITAMIN C) 500 MG/5ML syrup 100 mg  100 mg Oral Daily Dhungel, Nishant, MD   100 mg at 10/22/16 0907  . cholecalciferol (VITAMIN D) tablet 1,000 Units  1,000 Units  Oral Daily Dhungel, Nishant, MD   1,000 Units at 10/22/16 0906  . ciprofloxacin (CIPRO) IVPB 400 mg  400 mg Intravenous Once Charlynne PanderYao, David Hsienta, MD      . ciprofloxacin (CIPRO) IVPB 400 mg  400 mg Intravenous Q12H Eddie Northhungel, Nishant, MD   Stopped at 10/22/16 0853  . metroNIDAZOLE (FLAGYL) IVPB 500 mg  500 mg Intravenous Once Charlynne PanderYao, David Hsienta, MD      . metroNIDAZOLE (FLAGYL) IVPB 500 mg  500 mg Intravenous Q8H Dhungel, Nishant, MD   Stopped at 10/22/16 1006  . ondansetron (ZOFRAN) tablet 4 mg  4 mg Oral Q6H PRN Dhungel, Nishant, MD       Or  . ondansetron (ZOFRAN) injection 4 mg  4 mg Intravenous Q6H PRN Dhungel, Nishant, MD   4 mg at 10/21/16 1847  . pantoprazole (PROTONIX) injection 40 mg  40 mg Intravenous Q12H Dhungel, Nishant, MD   40 mg at 10/22/16 0903  . pravastatin (PRAVACHOL) tablet 20 mg  20 mg Oral q1800 Dhungel, Nishant, MD      . vitamin B-12 (CYANOCOBALAMIN) tablet 250 mcg  250 mcg Oral Daily  Dhungel, Nishant, MD   250 mcg at 10/22/16 0906    Allergies as of 10/21/2016  . (No Known Allergies)    History reviewed. No pertinent family history.  Social History   Social History  . Marital status: Married    Spouse name: N/A  . Number of children: 3  . Years of education: 12   Occupational History  . 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 Smoking status: Current Every Day Smoker    Packs/day: 0.50    Years: 55.00    Types: Cigarettes  . Smokeless tobacco: Never Used     Comment: Pt. has tryed to stop smoking in the past and stated she has went about a month without a ciggarette. Pt. also states she gets very stressed and smoking helps.  . Alcohol use No  . Drug use: No  . Sexual activity: No    Social History Narrative   Lives at home with husband - he is s/p stroke   Retired from [employer]   1 son and 2 daughters   Caffeine- coffee, 1 cup daily    Review of Systems: Pertinent positive and negative review of systems were noted in the above HPI section. Also has memory disturbance, diffuse weakness. Confusion admission Spring 2018 related to overuse of alprazolam.  All other review of systems was otherwise negative.Marland Kitchen.  Physical Exam: Vital signs in last 24 hours: Temp:  [98.4 F (36.9 C)-98.7 F (37.1 C)] 98.4 F (36.9 C) (08/12 0855) Pulse Rate:  [73-84] 80 (08/12 0855) Resp:  [13-19] 16 (08/12 0855) BP: (113-141)/(53-72) 132/65 (08/12 0855) SpO2:  [98 %-100 %] 99 % (08/12 0855) Weight:  [139 lb 4.8 oz (63.2 kg)-143 lb (64.9 kg)] 143 lb (64.9 kg) (08/11 2143) Last BM Date: 10/22/16 General:   Alert,  Well-developed, Thin frail appearing elderly white female, pleasant and cooperative in NAD, sitting on the bedside commode-small amount of brown liquid stool in the commode no gross blood currently Head:  Normocephalic and atraumatic. Eyes:  Sclera clear, no icterus.   Conjunctiva pink. Ears:  Normal auditory  acuity. Nose:  No deformity, discharge,  or lesions. Mouth:  No deformity or lesions.   Neck:  Supple; no masses or thyromegaly. Lungs:  Clear throughout to auscultation.   No wheezes, crackles,  or rhonchi. Heart:  Regular rate and rhythm; no murmurs, clicks, rubs,  or gallops. Abdomen:  Soft,tender across the mid abdomen and right lower quadrant no guarding or rebound, BS active,nonpalp mass or hsm.   Rectal:  Gross blood in stool per nursing  Msk:  Symmetrical without gross deformities. . Pulses:  Normal pulses noted. Extremities:  Without clubbing or edema. Neurologic:  Alert and  oriented x3;  grossly normal neurologically. Skin:  Intact without significant lesions or rashes.. Psych:  Alert and cooperative. Normal mood and affect.  Intake/Output from previous day: 08/11 0701 - 08/12 0700 In: 2887.1 [P.O.:360; I.V.:227.1; IV Piggyback:2300] Out: 0  Intake/Output this shift: Total I/O In: 240 [P.O.:240] Out: 4 [Stool:4]  Lab Results:  Recent Labs  10/21/16 1223 10/21/16 1236 10/22/16 0428 10/22/16 1222  WBC 9.7  --  11.1*  --   HGB 12.0 11.9* 9.8* 9.4*  HCT 34.1* 35.0* 27.7* 26.6*  PLT 317  --  278  --    BMET  Recent Labs  10/21/16 1223 10/21/16 1236 10/22/16 0428  NA 143 145 142  K 3.4* 3.4* 4.1  CL 120* 118* 120*  CO2 16*  --  18*  GLUCOSE 126* 123* 99  BUN 21* 25* 20  CREATININE 1.14* 0.90 1.12*  CALCIUM 8.1*  --  7.5*   LFT  Recent Labs  10/21/16 1223  PROT 5.2*  ALBUMIN 2.7*  AST 31  ALT 17  ALKPHOS 48  BILITOT 1.0

## 2016-10-23 DIAGNOSIS — R197 Diarrhea, unspecified: Secondary | ICD-10-CM

## 2016-10-23 DIAGNOSIS — R1110 Vomiting, unspecified: Secondary | ICD-10-CM | POA: Diagnosis present

## 2016-10-23 LAB — CBC
HEMATOCRIT: 25.4 % — AB (ref 36.0–46.0)
HEMOGLOBIN: 8.8 g/dL — AB (ref 12.0–15.0)
MCH: 28.1 pg (ref 26.0–34.0)
MCHC: 34.6 g/dL (ref 30.0–36.0)
MCV: 81.2 fL (ref 78.0–100.0)
Platelets: 238 10*3/uL (ref 150–400)
RBC: 3.13 MIL/uL — AB (ref 3.87–5.11)
RDW: 15.4 % (ref 11.5–15.5)
WBC: 13.1 10*3/uL — AB (ref 4.0–10.5)

## 2016-10-23 LAB — BASIC METABOLIC PANEL
ANION GAP: 4 — AB (ref 5–15)
BUN: 13 mg/dL (ref 6–20)
CHLORIDE: 115 mmol/L — AB (ref 101–111)
CO2: 22 mmol/L (ref 22–32)
Calcium: 7.7 mg/dL — ABNORMAL LOW (ref 8.9–10.3)
Creatinine, Ser: 0.89 mg/dL (ref 0.44–1.00)
GFR calc Af Amer: >60 mL/min (ref >60)
GFR calc non Af Amer: >60 mL/min (ref >60)
GLUCOSE: 79 mg/dL (ref 65–99)
POTASSIUM: 3.7 mmol/L (ref 3.5–5.1)
Sodium: 141 mmol/L (ref 135–145)

## 2016-10-23 LAB — HEMOGLOBIN AND HEMATOCRIT, BLOOD
HCT: 25.4 % — ABNORMAL LOW (ref 36.0–46.0)
Hemoglobin: 8.9 g/dL — ABNORMAL LOW (ref 12.0–15.0)

## 2016-10-23 LAB — C4 COMPLEMENT: Complement C4, Body Fluid: 26 mg/dL (ref 14–44)

## 2016-10-23 MED ORDER — CIPROFLOXACIN HCL 500 MG PO TABS
500.0000 mg | ORAL_TABLET | Freq: Two times a day (BID) | ORAL | Status: DC
  Administered 2016-10-23 – 2016-10-24 (×2): 500 mg via ORAL
  Filled 2016-10-23 (×2): qty 1

## 2016-10-23 MED ORDER — METRONIDAZOLE 500 MG PO TABS
250.0000 mg | ORAL_TABLET | Freq: Two times a day (BID) | ORAL | Status: DC
  Administered 2016-10-23 (×2): 250 mg via ORAL
  Filled 2016-10-23 (×2): qty 1

## 2016-10-23 MED ORDER — PANTOPRAZOLE SODIUM 40 MG PO TBEC
40.0000 mg | DELAYED_RELEASE_TABLET | Freq: Every day | ORAL | Status: DC
  Administered 2016-10-24: 40 mg via ORAL
  Filled 2016-10-23: qty 1

## 2016-10-23 NOTE — Progress Notes (Signed)
PROGRESS NOTE                                                                                                                                                                                                             Patient Demographics:    Kimberly Gregory, is a 81 y.o. female, DOB - 1936-01-25, ZOX:[id number]  Admit date - 10/21/2016   Admitting Physician Eddie North, MD  Outpatient Primary MD for the patient is Ralene Ok, MD  LOS - 2  Outpatient Specialists:None  Chief Complaint  Patient presents with  . GI Problem       Brief Narrative   81 year old female with history of hypertension, hyperlipidemia, anxiety, GERD, history of laxative abuse, prior hospitalization for dizziness and UTI brought to the ED with diffuse abdominal cramping associated with several episodes of nonbloody watery diarrhea, nausea with several episodes of nonbloody emesis on the day of admission. EMS found patient to be hypotensive with blood pressure of 60/40 mmHg. In the ED she was hypothermic with temperature of 94 F and blood work showed normal CBC, hypokalemia, non-and an gap metabolic acidosis and mildly elevated lactic acid. CT of the abdomen showed extensive bowel wall thickening extending from stomach to distal colon. Patient admitted to hospitalist service with supportive care and empiric antibiotics.   Subjective:   Diarrhea resolved. Has some abdominal cramping but no worsening. Denies nausea or vomiting.   Assessment  & Plan :    Principal Problem:   SIRS (systemic inflammatory response syndrome) (HCC) Secondary to acute diffuse enteritis/colitis. GI consult appreciated suspect this could be either infectious or ACE inhibitor angioedema. Recommend supportive care, empiric antibiotics, clear liquid, IV fluids. Recommend checking C4 level and if it low, Check see 1 inhibitor function and protein levels. Repeat lactic acid  normal. Further recommendations per GI.  Active Problems: GI bleed Secondary to infection versus ischemic colitis. Slight drop in H&H was stable.  Diarrhea Significantly improved. Stool for C. difficile GI pathogen panel negative. Discontinue cautions.  Hypertension Enalapril discontinued due to hypotension and suspicion for ACE inhibitor angioedema causing colitis.    Acute metabolic encephalopathy Secondary to acute illness and dehydration. Now at baseline.    Hypokalemia Replenish    Non-anion gap Metabolic acidosis Secondary to GI loss. Improved with fluids  Hyperlipidemia Continue statin.  Laxative abuse Patient continues to  decline.    Code Status : Full code  Family Communication  : None at bedside  Disposition Plan  : Home pending hospital course and further GI recommendations  Barriers For Discharge : Active symptoms  Consults  : [facility]  Procedures  : CT abdomen and pelvis  DVT Prophylaxis  :  SCDs  Lab Results  Component Value Date   PLT 238 10/23/2016    Antibiotics  :    Anti-infectives    Start     Dose/Rate Route Frequency Ordered Stop   10/21/16 1800  ciprofloxacin (CIPRO) IVPB 400 mg     400 mg 200 mL/hr over 60 Minutes Intravenous Every 12 hours 10/21/16 1623     10/21/16 1800  metroNIDAZOLE (FLAGYL) IVPB 500 mg     500 mg 100 mL/hr over 60 Minutes Intravenous Every 8 hours 10/21/16 1623     10/21/16 1445  ciprofloxacin (CIPRO) IVPB 400 mg  Status:  Discontinued     400 mg 200 mL/hr over 60 Minutes Intravenous  Once 10/21/16 1436 10/23/16 1050   10/21/16 1445  metroNIDAZOLE (FLAGYL) IVPB 500 mg  Status:  Discontinued     500 mg 100 mL/hr over 60 Minutes Intravenous  Once 10/21/16 1436 10/23/16 1050        Objective:   Vitals:   10/22/16 1711 10/22/16 2126 10/23/16 0505 10/23/16 0904  BP: 112/70 (!) 101/36 (!) 116/44 (!) 107/47  Pulse: 77 78 75 73  Resp:  16 15 16   Temp: 98.1 F (36.7 C) 98 F (36.7 C) 98.6 F (37 C)  98.7 F (37.1 C)  TempSrc: Oral   Oral  SpO2: 98% 100% 96% 98%  Weight:  64 kg (141 lb 1.5 oz)    Height:        Wt Readings from Last 3 Encounters:  10/22/16 64 kg (141 lb 1.5 oz)  06/13/16 61.5 kg (135 lb 9.3 oz)  06/06/16 62.6 kg (138 lb)     Intake/Output Summary (Last 24 hours) at 10/23/16 1142 Last data filed at 10/23/16 1135  Gross per 24 hour  Intake             4515 ml  Output              306 ml  Net             4209 ml     Physical Exam Gen.: Elderly female not in distress HEENT: Moist mucosa, supple neck Chest: Clear to auscultation bilaterally CVS: Normal S1 and S2, no murmurs GI: Soft, nondistended, mild infraumbilical tenderness, bowel sounds present Musculoskeletal: Warm, no edema     Data Review:    CBC  Recent Labs Lab 10/21/16 1223 10/21/16 1236 10/22/16 0428 10/22/16 1222 10/22/16 2000 10/23/16 0501  WBC 9.7  --  11.1*  --   --  13.1*  HGB 12.0 11.9* 9.8* 9.4* 8.7* 8.8*  HCT 34.1* 35.0* 27.7* 26.6* 24.7* 25.4*  PLT 317  --  278  --   --  238  MCV 82.0  --  81.0  --   --  81.2  MCH 28.8  --  28.7  --   --  28.1  MCHC 35.2  --  35.4  --   --  34.6  RDW 14.7  --  15.1  --   --  15.4  LYMPHSABS 1.7  --   --   --   --   --   MONOABS 0.1  --   --   --   --   --  EOSABS 0.0  --   --   --   --   --   BASOSABS 0.0  --   --   --   --   --     Chemistries   Recent Labs Lab 10/21/16 1223 10/21/16 1236 10/22/16 0428 10/23/16 0501  NA 143 145 142 141  K 3.4* 3.4* 4.1 3.7  CL 120* 118* 120* 115*  CO2 16*  --  18* 22  GLUCOSE 126* 123* 99 79  BUN 21* 25* 20 13  CREATININE 1.14* 0.90 1.12* 0.89  CALCIUM 8.1*  --  7.5* 7.7*  AST 31  --   --   --   ALT 17  --   --   --   ALKPHOS 48  --   --   --   BILITOT 1.0  --   --   --    ------------------------------------------------------------------------------------------------------------------ No results for input(s): CHOL, HDL, LDLCALC, TRIG, CHOLHDL, LDLDIRECT in the last 72  hours.  No results found for: HGBA1C ------------------------------------------------------------------------------------------------------------------  Recent Labs  10/21/16 1619  TSH 1.023   ------------------------------------------------------------------------------------------------------------------ No results for input(s): VITAMINB12, FOLATE, FERRITIN, TIBC, IRON, RETICCTPCT in the last 72 hours.  Coagulation profile  Recent Labs Lab 10/22/16 1404  INR 1.42    No results for input(s): DDIMER in the last 72 hours.  Cardiac Enzymes No results for input(s): CKMB, TROPONINI, MYOGLOBIN in the last 168 hours.  Invalid input(s): CK ------------------------------------------------------------------------------------------------------------------ No results found for: BNP  Inpatient Medications  Scheduled Meds: . ascorbic acid  100 mg Oral Daily  . cholecalciferol  1,000 Units Oral Daily  . pantoprazole (PROTONIX) IV  40 mg Intravenous Q12H  . pravastatin  20 mg Oral q1800  . vitamin B-12  250 mcg Oral Daily   Continuous Infusions: . ciprofloxacin Stopped (10/23/16 0733)  . metronidazole Stopped (10/23/16 1022)   PRN Meds:.acetaminophen **OR** acetaminophen, ALPRAZolam, ondansetron **OR** ondansetron (ZOFRAN) IV  Micro Results Recent Results (from the past 240 hour(s))  Gastrointestinal Panel by PCR , Stool     Status: None   Collection Time: 10/21/16 11:30 AM  Result Value Ref Range Status   Campylobacter species NOT DETECTED NOT DETECTED Final   Plesimonas shigelloides NOT DETECTED NOT DETECTED Final   Salmonella species NOT DETECTED NOT DETECTED Final   Yersinia enterocolitica NOT DETECTED NOT DETECTED Final   Vibrio species NOT DETECTED NOT DETECTED Final   Vibrio cholerae NOT DETECTED NOT DETECTED Final   Enteroaggregative E coli (EAEC) NOT DETECTED NOT DETECTED Final   Enteropathogenic E coli (EPEC) NOT DETECTED NOT DETECTED Final   Enterotoxigenic E  coli (ETEC) NOT DETECTED NOT DETECTED Final   Shiga like toxin producing E coli (STEC) NOT DETECTED NOT DETECTED Final   Shigella/Enteroinvasive E coli (EIEC) NOT DETECTED NOT DETECTED Final   Cryptosporidium NOT DETECTED NOT DETECTED Final   Cyclospora cayetanensis NOT DETECTED NOT DETECTED Final   Entamoeba histolytica NOT DETECTED NOT DETECTED Final   Giardia lamblia NOT DETECTED NOT DETECTED Final   Adenovirus F40/41 NOT DETECTED NOT DETECTED Final   Astrovirus NOT DETECTED NOT DETECTED Final   Norovirus GI/GII NOT DETECTED NOT DETECTED Final   Rotavirus A NOT DETECTED NOT DETECTED Final   Sapovirus (I, II, IV, and V) NOT DETECTED NOT DETECTED Final  C difficile quick scan w PCR reflex     Status: None   Collection Time: 10/21/16 11:30 AM  Result Value Ref Range Status   C Diff antigen NEGATIVE NEGATIVE Final  C Diff toxin NEGATIVE NEGATIVE Final   C Diff interpretation No C. difficile detected.  Final  Blood culture (routine x 2)     Status: None (Preliminary result)   Collection Time: 10/21/16 12:19 PM  Result Value Ref Range Status   Specimen Description BLOOD LEFT ANTECUBITAL  Final   Special Requests   Final    BOTTLES DRAWN AEROBIC AND ANAEROBIC Blood Culture adequate volume   Culture NO GROWTH < 24 HOURS  Final   Report Status PENDING  Incomplete  Blood culture (routine x 2)     Status: None (Preliminary result)   Collection Time: 10/21/16 12:27 PM  Result Value Ref Range Status   Specimen Description BLOOD RIGHT HAND  Final   Special Requests   Final    BOTTLES DRAWN AEROBIC ONLY Blood Culture adequate volume   Culture NO GROWTH < 24 HOURS  Final   Report Status PENDING  Incomplete    Radiology Reports Ct Head Wo Contrast  Result Date: 10/21/2016 CLINICAL DATA:  Nausea, vomiting, and diarrhea beginning this morning, altered level of consciousness, pale, diaphoretic, hypotensive, syncopal episode purse patient, history hypertension and vertigo EXAM: CT HEAD WITHOUT  CONTRAST TECHNIQUE: Contiguous axial images were obtained from the base of the skull through the vertex without intravenous contrast. Sagittal and coronal MPR images reconstructed from axial data set. COMPARISON:  06/13/2016 FINDINGS: Brain: Generalized atrophy. Normal ventricular morphology. No midline shift or mass effect. Mild small vessel chronic ischemic changes of deep cerebral white matter. Otherwise normal appearance of brain parenchyma. No intracranial hemorrhage, mass lesion or evidence of acute infarction. No extra-axial fluid collections. Vascular: Atherosclerotic calcification of internal carotid arteries at skullbase Skull: Normal appearance Sinuses/Orbits: Clear Other: N/A IMPRESSION: Atrophy with small vessel chronic ischemic changes of deep cerebral white matter. No acute intracranial abnormalities. Electronically Signed   By: Ulyses SouthwardMark  Boles M.D.   On: 10/21/2016 14:06   Ct Abdomen Pelvis W Contrast  Result Date: 10/21/2016 CLINICAL DATA:  Nausea, vomiting and diarrhea beginning this morning. Syncopal episode. EXAM: CT ABDOMEN AND PELVIS WITH CONTRAST TECHNIQUE: Multidetector CT imaging of the abdomen and pelvis was performed using the standard protocol following bolus administration of intravenous contrast. CONTRAST:  100mL ISOVUE-300 IOPAMIDOL (ISOVUE-300) INJECTION 61% COMPARISON:  06/06/2016; 07/03/2012; 03/20/2005 FINDINGS: Lower chest: Limited visualization of the lower thorax demonstrates minimal dependent subpleural ground-glass atelectasis within image right lower lobe. No focal airspace opacities. No pleural effusion. Normal heart size.  No pericardial effusion. Hepatobiliary: Normal hepatic contour. No discrete hepatic lesions. Post cholecystectomy. Common bile duct appears mildly dilated measuring approximately 8 mm in diameter with associated mild centralized intrahepatic biliary duct dilatation, likely the sequela of biliary reservoir phenomenon. No ascites. Pancreas: There is an  apparent 1.4 cm hypoattenuating lesion within the caudal most aspect of the head of the pancreas (image 30, coronal image 69, series 6). This finding is without associated downstream pancreatic ductal dilatation or atrophy. Spleen: Normal appearance of the spleen Adrenals/Urinary Tract: There is symmetric enhancement and excretion of the bilateral kidneys. Bilateral punctate subcentimeter hypoattenuating renal lesions too small to adequately characterize though favored to represent renal cysts. No renal stones. No urinary obstruction. Normal appearance the bilateral adrenal glands. Normal appearance of the urinary bladder given degree of distention. Stomach/Bowel: Large hiatal hernia. Extensive enteric wall thickening involving the gastric antrum (image 21, series 3 extending to involve the majority of the proximal small bowel with a relative mild sparing of the distal terminal ileum. There is fluid distention of  the cecum and ascending colon with wall thickening involving the descending and sigmoid colon extending to the level the rectum. Scattered colonic diverticulosis without evidence of diverticulitis. Small enough fluid is seen within the lower abdomen without evidence of perforation or definable/drainable fluid collection. No pneumoperitoneum, pneumatosis or portal venous gas. Vascular/Lymphatic: Moderate amount of eccentric mixed calcified and noncalcified atherosclerotic plaque with a normal caliber abdominal aorta. The branch vessels of the abdominal aorta appear patent on this non CTA examination. No bulky retroperitoneal, mesenteric, pelvic or inguinal lymphadenopathy. Reproductive: Potential vaginal prolapse. Post hysterectomy. No discrete adnexal lesion. Other: Regional soft tissues appear normal. Musculoskeletal: No acute or aggressive osseous abnormalities. Mild-to-moderate multilevel lumbar spine DDD, worse at L4-L5 and L5-S1 with disc space height loss, endplate irregularity and sclerosis. Moderate  severe degenerative change the right hip with joint space loss, subchondral sclerosis and osteophytosis. IMPRESSION: 1. Rather extensive bowel wall thickening involving the gastric antrum, majority of the proximal small bowel as well as the descending and sigmoid colon to the level the rectum. Findings are nonspecific though given extent, may be of infectious etiology with additional differential and considerations including inflammatory and ischemic etiologies. Clinical correlation is advised. No evidence of enteric obstruction or definable/drainable fluid collection. 2. Probable 1.4 cm cystic lesion within the uncinate process of the pancreas similar to the 2007 examination and thus of benign etiology. 3. Aortic Atherosclerosis (ICD10-I70.0). 4. Large hiatal hernia. Electronically Signed   By: Simonne Come M.D.   On: 10/21/2016 14:16   Dg Chest Port 1 View  Result Date: 10/21/2016 CLINICAL DATA:  Nausea, vomiting and diarrhea since this morning. Smoker. EXAM: PORTABLE CHEST 1 VIEW COMPARISON:  06/13/2016. FINDINGS: Normal sized heart. Tortuous aorta. Clear lungs with diffusely prominent interstitial markings. Normal vascularity. No pleural fluid. Diffuse osteopenia. Moderate right AC joint degenerative changes. IMPRESSION: No acute abnormality.  Mild chronic interstitial lung disease. Electronically Signed   By: Beckie Salts M.D.   On: 10/21/2016 12:39   Dg Abd Portable 1 View  Result Date: 10/21/2016 CLINICAL DATA:  Vomiting. EXAM: PORTABLE ABDOMEN - 1 VIEW COMPARISON:  Abdomen and pelvis CT dated 06/06/2016. FINDINGS: Paucity of intestinal gas. No visible dilated bowel loops. Cholecystectomy clips. Mild lumbar spine degenerative changes. Right hip degenerative changes. IMPRESSION: No acute abnormality. Electronically Signed   By: Beckie Salts M.D.   On: 10/21/2016 12:38    Time Spent in minutes  25   Eddie North M.D on 10/23/2016 at 11:42 AM  Between 7am to 7pm - Pager - [phone]  After  7pm go to [email or web address] - password [facility]  [facility] -  Office  [phone]

## 2016-10-23 NOTE — Progress Notes (Signed)
Daily Rounding Note  10/23/2016, 1:18 PM  LOS: 2 days   SUBJECTIVE:   Chief complaint: Abdominal pain and nausea are all resolved.  Stool this morning was small, soft/formed and brown. She hasn't seen any blood in the stool since earlier yesterday. She is tolerating clear liquids.   She has ambulated to the bathroom without difficulty but is not allowed yet to walk in the halls. Patient really wants to go home.  OBJECTIVE:         Vital signs in last 24 hours:    Temp:  [98 F (36.7 C)-98.7 F (37.1 C)] 98.7 F (37.1 C) (08/13 0904) Pulse Rate:  [73-78] 73 (08/13 0904) Resp:  [15-16] 16 (08/13 0904) BP: (101-116)/(36-70) 107/47 (08/13 0904) SpO2:  [96 %-100 %] 98 % (08/13 0904) Weight:  [64 kg (141 lb 1.5 oz)] 64 kg (141 lb 1.5 oz) (08/12 2126) Last BM Date: 10/22/16 Filed Weights   10/21/16 1614 10/21/16 2143 10/22/16 2126  Weight: 63.2 kg (139 lb 4.8 oz) 64.9 kg (143 lb) 64 kg (141 lb 1.5 oz)   General: Thin, somewhat frail. Doesn't look acutely ill. Comfortable. Fully alert   Heart: RRR. No MRG. Chest: Clear bilaterally. No labored breathing or cough. Abdomen: Nondistended. Nontender. Bowel sounds active. No bloating or distention.  Extremities: No CCE. Overall limbs are within Neuro/Psych:  Pleasant, oriented times 3. No obvious limb weakness or tremors.  Intake/Output from previous day: 08/12 0701 - 08/13 0700 In: 4395 [P.O.:720; I.V.:3075; IV Piggyback:600] Out: 160 [Urine:2; Stool:158]  Intake/Output this shift: Total I/O In: 360 [P.O.:360] Out: 150 [Urine:150]  Lab Results:  Recent Labs  10/21/16 1223  10/22/16 0428 10/22/16 1222 10/22/16 2000 10/23/16 0501  WBC 9.7  --  11.1*  --   --  13.1*  HGB 12.0  < > 9.8* 9.4* 8.7* 8.8*  HCT 34.1*  < > 27.7* 26.6* 24.7* 25.4*  PLT 317  --  278  --   --  238  < > = values in this interval not displayed. BMET  Recent Labs  10/21/16 1223  10/21/16 1236 10/22/16 0428 10/23/16 0501  NA 143 145 142 141  K 3.4* 3.4* 4.1 3.7  CL 120* 118* 120* 115*  CO2 16*  --  18* 22  GLUCOSE 126* 123* 99 79  BUN 21* 25* 20 13  CREATININE 1.14* 0.90 1.12* 0.89  CALCIUM 8.1*  --  7.5* 7.7*   LFT  Recent Labs  10/21/16 1223  PROT 5.2*  ALBUMIN 2.7*  AST 31  ALT 17  ALKPHOS 48  BILITOT 1.0   PT/INR  Recent Labs  10/22/16 1404  LABPROT 17.5*  INR 1.42   Hepatitis Panel No results for input(s): HEPBSAG, HCVAB, HEPAIGM, HEPBIGM in the last 72 hours.  Studies/Results: Ct Head Wo Contrast  Result Date: 10/21/2016 CLINICAL DATA:  Nausea, vomiting, and diarrhea beginning this morning, altered level of consciousness, pale, diaphoretic, hypotensive, syncopal episode purse patient, history hypertension and vertigo EXAM: CT HEAD WITHOUT CONTRAST TECHNIQUE: Contiguous axial images were obtained from the base of the skull through the vertex without intravenous contrast. Sagittal and coronal MPR images reconstructed from axial data set. COMPARISON:  06/13/2016 FINDINGS: Brain: Generalized atrophy. Normal ventricular morphology. No midline shift or mass effect. Mild small vessel chronic ischemic changes of deep cerebral white matter. Otherwise normal appearance of brain parenchyma. No intracranial hemorrhage, mass lesion or evidence of acute infarction. No extra-axial fluid collections. Vascular: Atherosclerotic  calcification of internal carotid arteries at skullbase Skull: Normal appearance Sinuses/Orbits: Clear Other: N/A IMPRESSION: Atrophy with small vessel chronic ischemic changes of deep cerebral white matter. No acute intracranial abnormalities. Electronically Signed   By: Ulyses Southward M.D.   On: 10/21/2016 14:06   Ct Abdomen Pelvis W Contrast  Result Date: 10/21/2016 CLINICAL DATA:  Nausea, vomiting and diarrhea beginning this morning. Syncopal episode. EXAM: CT ABDOMEN AND PELVIS WITH CONTRAST TECHNIQUE: Multidetector CT imaging of the  abdomen and pelvis was performed using the standard protocol following bolus administration of intravenous contrast. CONTRAST:  ISOVUE-300 IOPAMIDOL (ISOVUE-300) INJECTION 61% COMPARISON:  06/06/2016; 07/03/2012; 03/20/2005 FINDINGS: Lower chest: Limited visualization of the lower thorax demonstrates minimal dependent subpleural ground-glass atelectasis within image right lower lobe. No focal airspace opacities. No pleural effusion. Normal heart size.  No pericardial effusion. Hepatobiliary: Normal hepatic contour. No discrete hepatic lesions. Post cholecystectomy. Common bile duct appears mildly dilated measuring approximately 8 mm in diameter with associated mild centralized intrahepatic biliary duct dilatation, likely the sequela of biliary reservoir phenomenon. No ascites. Pancreas: There is an apparent 1.4 cm hypoattenuating lesion within the caudal most aspect of the head of the pancreas (image 30, coronal image 69, series 6). This finding is without associated downstream pancreatic ductal dilatation or atrophy. Spleen: Normal appearance of the spleen Adrenals/Urinary Tract: There is symmetric enhancement and excretion of the bilateral kidneys. Bilateral punctate subcentimeter hypoattenuating renal lesions too small to adequately characterize though favored to represent renal cysts. No renal stones. No urinary obstruction. Normal appearance the bilateral adrenal glands. Normal appearance of the urinary bladder given degree of distention. Stomach/Bowel: Large hiatal hernia. Extensive enteric wall thickening involving the gastric antrum (image 21, series 3 extending to involve the majority of the proximal small bowel with a relative mild sparing of the distal terminal ileum. There is fluid distention of the cecum and ascending colon with wall thickening involving the descending and sigmoid colon extending to the level the rectum. Scattered colonic diverticulosis without evidence of diverticulitis. Small  enough fluid is seen within the lower abdomen without evidence of perforation or definable/drainable fluid collection. No pneumoperitoneum, pneumatosis or portal venous gas. Vascular/Lymphatic: Moderate amount of eccentric mixed calcified and noncalcified atherosclerotic plaque with a normal caliber abdominal aorta. The branch vessels of the abdominal aorta appear patent on this non CTA examination. No bulky retroperitoneal, mesenteric, pelvic or inguinal lymphadenopathy. Reproductive: Potential vaginal prolapse. Post hysterectomy. No discrete adnexal lesion. Other: Regional soft tissues appear normal. Musculoskeletal: No acute or aggressive osseous abnormalities. Mild-to-moderate multilevel lumbar spine DDD, worse at L4-L5 and L5-S1 with disc space height loss, endplate irregularity and sclerosis. Moderate severe degenerative change the right hip with joint space loss, subchondral sclerosis and osteophytosis. IMPRESSION: 1. Rather extensive bowel wall thickening involving the gastric antrum, majority of the proximal small bowel as well as the descending and sigmoid colon to the level the rectum. Findings are nonspecific though given extent, may be of infectious etiology with additional differential and considerations including inflammatory and ischemic etiologies. Clinical correlation is advised. No evidence of enteric obstruction or definable/drainable fluid collection. 2. Probable 1.4 cm cystic lesion within the uncinate process of the pancreas similar to the 2007 examination and thus of benign etiology. 3. Aortic Atherosclerosis (ICD10-I70.0). 4. Large hiatal hernia. Electronically Signed   By: Simonne Come M.D.   On: 10/21/2016 14:16   Scheduled Meds: . ascorbic acid  100 mg Oral Daily  . cholecalciferol  1,000 Units Oral Daily  . pantoprazole (PROTONIX)  IV  40 mg Intravenous Q12H  . pravastatin  20 mg Oral q1800  . vitamin B-12  250 mcg Oral Daily   Continuous Infusions: . ciprofloxacin Stopped  (10/23/16 0733)  . metronidazole Stopped (10/23/16 1022)   PRN Meds:.acetaminophen **OR** acetaminophen, ALPRAZolam, ondansetron **OR** ondansetron (ZOFRAN) IV   ASSESMENT:   *  Abdominal pain, nausea, vomiting, bloody stool. Abnormal gastric thickening as well as small bowel thickening on CT with elevated lactic acid. Stool testing for C. difficile negative. Gastrointestinal panel on stool all negative. FOBT positive. Suspect ischemic event related to volume loss and hypotension. Day 3 empiric IV Cipro/Flagyl.   Clinically symptoms all resolved. C4 complement level in process - if that is low then would check C1 inhibitor function and protein levels and a C1q level as outlined yesterday by Dr. Leone PayorGessner.   If she goes home before the C4 comes back, does not need to return for outpatient labs. Hemorrhoids and diverticulosis on 2005 colonoscopy.  *  Normocytic anemia. Hemoglobin stable.   PLAN   *  Advance diet.  *  Stop the IV Protonix. Resume oral Protonix. She is taking this 20 mg twice a day at home.  *  Switching to oral Cipro and Flagyl. Will discuss duration of this empiric therapy with Dr. Russella DarStark.    Jennye MoccasinSarah Gribbin  10/23/2016, 1:18 PM Pager: [phone]    Attending physician's note   I have taken an interval history, reviewed the chart and examined the patient. I agree with the Advanced Practitioner's note, impression and recommendations. Etiology of her acute GI illness is not clear. A 5-7 day course of cipro and flagyl is reasonable. Would continue to avoid ACE inhibitors. Awaiting C4 levels. Advance diet as tolerated. OK for discharge from GI standpoint. C4 can be followed as outpatient. GI follow up in 1 month.   Claudette HeadMalcolm Camari Wisham, MD Clementeen GrahamFACG [phone] Mon-Fri 8a-5p [phone] after 5p, weekends, holidays

## 2016-10-24 DIAGNOSIS — E860 Dehydration: Secondary | ICD-10-CM

## 2016-10-24 DIAGNOSIS — K559 Vascular disorder of intestine, unspecified: Secondary | ICD-10-CM | POA: Diagnosis present

## 2016-10-24 HISTORY — DX: Vascular disorder of intestine, unspecified: K55.9

## 2016-10-24 MED ORDER — PANTOPRAZOLE SODIUM 20 MG PO TBEC: 20.0000 mg | DELAYED_RELEASE_TABLET | Freq: Every day | ORAL | 0 refills | Status: DC

## 2016-10-24 NOTE — Progress Notes (Signed)
Daily Rounding Note  10/24/2016, 8:30 AM  LOS: 3 days   SUBJECTIVE:   Chief complaint:   Small, light brown stool.  No abd pain.  Tolerating Regular diet.  Feels great  OBJECTIVE:         Vital signs in last 24 hours:    Temp:  [98.3 F (36.8 C)-98.9 F (37.2 C)] 98.9 F (37.2 C) (08/14 0501) Pulse Rate:  [73-86] 73 (08/14 0501) Resp:  [16-17] 16 (08/14 0501) BP: (107-134)/(47-58) 121/53 (08/14 0501) SpO2:  [98 %-100 %] 98 % (08/14 0501) Last BM Date: 10/23/16 Filed Weights   10/21/16 1614 10/21/16 2143 10/22/16 2126  Weight: 63.2 kg (139 lb 4.8 oz) 64.9 kg (143 lb) 64 kg (141 lb 1.5 oz)   General: looks well   Heart: RRR Chest: clear.  No dyspnea Abdomen: soft, slightest tenderness RUQ.  Active BS, ND  Extremities: no CCE Neuro/Psych:  Oriented x 3.  No weakness or deficits.    Intake/Output from previous day: 08/13 0701 - 08/14 0700 In: 940 [P.O.:940] Out: 150 [Urine:150]  Intake/Output this shift: No intake/output data recorded.  Lab Results:  Recent Labs  10/21/16 1223  10/22/16 0428  10/22/16 2000 10/23/16 0501 10/23/16 1255  WBC 9.7  --  11.1*  --   --  13.1*  --   HGB 12.0  < > 9.8*  < > 8.7* 8.8* 8.9*  HCT 34.1*  < > 27.7*  < > 24.7* 25.4* 25.4*  PLT 317  --  278  --   --  238  --   < > = values in this interval not displayed. BMET  Recent Labs  10/21/16 1223 10/21/16 1236 10/22/16 0428 10/23/16 0501  NA 143 145 142 141  K 3.4* 3.4* 4.1 3.7  CL 120* 118* 120* 115*  CO2 16*  --  18* 22  GLUCOSE 126* 123* 99 79  BUN 21* 25* 20 13  CREATININE 1.14* 0.90 1.12* 0.89  CALCIUM 8.1*  --  7.5* 7.7*   LFT  Recent Labs  10/21/16 1223  PROT 5.2*  ALBUMIN 2.7*  AST 31  ALT 17  ALKPHOS 48  BILITOT 1.0   PT/INR  Recent Labs  10/22/16 1404  LABPROT 17.5*  INR 1.42   Hepatitis Panel No results for input(s): HEPBSAG, HCVAB, HEPAIGM, HEPBIGM in the last 72  hours.  Studies/Results: No results found.  Scheduled Meds: . ascorbic acid  100 mg Oral Daily  . cholecalciferol  1,000 Units Oral Daily  . ciprofloxacin  500 mg Oral BID  . metroNIDAZOLE  250 mg Oral Q12H  . pantoprazole  40 mg Oral Q0600  . pravastatin  20 mg Oral q1800  . vitamin B-12  250 mcg Oral Daily   Continuous Infusions: PRN Meds:.acetaminophen **OR** acetaminophen, ALPRAZolam, ondansetron **OR** ondansetron (ZOFRAN) IV   ASSESMENT:   *  Abdominal pain, nausea, vomiting, bloody stool. Abnormal gastric thickening as well as small bowel thickening on CT with elevated lactic acid. Stool testing for C. difficile negative. Gastrointestinal panel on stool all negative. FOBT positive.  C4 complement level 26: WNL.  Suspect ischemic event related to volume loss and hypotension. Day 4 empiric IV >> PO Cipro/Flagyl.   Clinically symptoms all resolved. Hemorrhoids and diverticulosis on 2005 colonoscopy.  *  Normocytic anemia. Hemoglobin stable.    PLAN   *  Ok to discharge, stop abx now.  GI follow up prn, Dr Russella DarStark is her GI  MD.     Kimberly Gregory  10/24/2016, 8:30 AM Pager: [phone]    Attending physician's note   I have taken an interval history, reviewed the chart and examined the patient. I agree with the Advanced Practitioner's note, impression and recommendations.   Claudette Head, MD Clementeen Graham [phone] Mon-Fri 8a-5p [phone] after 5p, weekends, holidays

## 2016-10-24 NOTE — Discharge Summary (Addendum)
Physician Discharge Summary  Kimberly Gregory ZOX:[id number] DOB: 1935-09-25 DOA: 10/21/2016  PCP: Ralene Ok, MD  Admit date: 10/21/2016 Discharge date: 10/24/2016  Admitted From: home Disposition: home  Recommendations for Outpatient Follow-up:  1. Follow up with PCP in 1-2 week.  monitor H&H during outpatient follow-up. 2. Follow-up with GI Dr. Russella Dar as needed  Home Health:none Equipment/Devices:none  Discharge Condition: rare CODE STATUS: full code Diet recommendation: low sodium    Discharge Diagnoses:  Principal Problem:   Acute colitis  Active Problems:   SIRS (systemic inflammatory response syndrome) (HCC)   Enteritis   Acute metabolic encephalopathy   Hypokalemia   Metabolic acidosis   Dementia   Laxative abuse   Vomiting and diarrhea   Dehydration   Ischemic colitis (HCC)  Brief narrative/history of present illness 81 year old female with history of hypertension, hyperlipidemia, anxiety, GERD, history of laxative abuse, prior hospitalization for dizziness and UTI brought to the ED with diffuse abdominal cramping associated with several episodes of nonbloody watery diarrhea, nausea with several episodes of nonbloody emesis on the day of admission. EMS found patient to be hypotensive with blood pressure of 60/40 mmHg. In the ED she was hypothermic with temperature of 94 F and blood work showed normal CBC, hypokalemia, non-and an gap metabolic acidosis and mildly elevated lactic acid. CT of the abdomen showed extensive bowel wall thickening extending from stomach to distal colon. Patient admitted to hospitalist service with supportive care and empiric antibiotics.  Hospital course GI bleed Infection ruled out. GI consult appreciated. Likely ischemic colitis. Also had suspicion for angioedema due to ACE inhibitor, however C4 was negative. We  decided to discontinue her enalapril. Symptoms resolved. Slight drop in H&H but stable. antibiotics discontinued. Advanced  diet tolerated. Patient can be discharged home with outpatient follow-up with PCP and GI (Dr. Russella Dar) as needed.  Diarrhea Resolved. Stool for C. difficile GI pathogen panel negative. Blood cultures negative.  hypotension Enalapril discontinued due to hypotension and suspicion for ACE inhibitor angioedema causing colitis. blood pressure has remained stable without antihypertensives.    Acute metabolic encephalopathy Secondary to acute illness and dehydration. resolved    Hypokalemia Replenished.     Non-anion gap Metabolic acidosis Secondary to GI loss. Improved with fluids  Hyperlipidemia Continue statin.  Laxative abuse Patient continues to decline.      Family Communication  : None at bedside  Disposition Plan  : Home   Consults  : [facility]  Procedures  : CT abdomen and pelvis    Discharge Instructions   Allergies as of 10/24/2016   No Known Allergies     Medication List    STOP taking these medications   diphenoxylate-atropine 2.5-0.025 MG tablet Commonly known as:  LOMOTIL   enalapril 5 MG tablet Commonly known as:  VASOTEC   ondansetron 4 MG disintegrating tablet Commonly known as:  ZOFRAN ODT   pantoprazole 20 MG tablet Commonly known as:  PROTONIX     TAKE these medications   ALPRAZolam 0.5 MG tablet Commonly known as:  XANAX Take 0.5 mg by mouth daily as needed for anxiety or sleep.   cholecalciferol 1000 units tablet Commonly known as:  VITAMIN D Take 1,000 Units by mouth daily.   lovastatin 40 MG tablet Commonly known as:  MEVACOR Take 40 mg by mouth daily.   vitamin B-12 250 MCG tablet Commonly known as:  CYANOCOBALAMIN Take 250 mcg by mouth daily.   vitamin C 100 MG tablet Take 100 mg by mouth daily.  Follow-up Information    Ralene OkMoreira, Roy, MD. Schedule an appointment as soon as possible for a visit in 1 week(s).   Specialty:  Internal Medicine Contact information: [address] [place]  [id number] [phone]          No Known Allergies     Procedures/Studies: Ct Head Wo Contrast  Result Date: 10/21/2016 CLINICAL DATA:  Nausea, vomiting, and diarrhea beginning this morning, altered level of consciousness, pale, diaphoretic, hypotensive, syncopal episode purse patient, history hypertension and vertigo EXAM: CT HEAD WITHOUT CONTRAST TECHNIQUE: Contiguous axial images were obtained from the base of the skull through the vertex without intravenous contrast. Sagittal and coronal MPR images reconstructed from axial data set. COMPARISON:  06/13/2016 FINDINGS: Brain: Generalized atrophy. Normal ventricular morphology. No midline shift or mass effect. Mild small vessel chronic ischemic changes of deep cerebral white matter. Otherwise normal appearance of brain parenchyma. No intracranial hemorrhage, mass lesion or evidence of acute infarction. No extra-axial fluid collections. Vascular: Atherosclerotic calcification of internal carotid arteries at skullbase Skull: Normal appearance Sinuses/Orbits: Clear Other: N/A IMPRESSION: Atrophy with small vessel chronic ischemic changes of deep cerebral white matter. No acute intracranial abnormalities. Electronically Signed   By: Ulyses SouthwardMark  Boles M.D.   On: 10/21/2016 14:06   Ct Abdomen Pelvis W Contrast  Result Date: 10/21/2016 CLINICAL DATA:  Nausea, vomiting and diarrhea beginning this morning. Syncopal episode. EXAM: CT ABDOMEN AND PELVIS WITH CONTRAST TECHNIQUE: Multidetector CT imaging of the abdomen and pelvis was performed using the standard protocol following bolus administration of intravenous contrast. CONTRAST:  100mL ISOVUE-300 IOPAMIDOL (ISOVUE-300) INJECTION 61% COMPARISON:  06/06/2016; 07/03/2012; 03/20/2005 FINDINGS: Lower chest: Limited visualization of the lower thorax demonstrates minimal dependent subpleural ground-glass atelectasis within image right lower lobe. No focal airspace opacities. No pleural effusion. Normal heart size.  No  pericardial effusion. Hepatobiliary: Normal hepatic contour. No discrete hepatic lesions. Post cholecystectomy. Common bile duct appears mildly dilated measuring approximately 8 mm in diameter with associated mild centralized intrahepatic biliary duct dilatation, likely the sequela of biliary reservoir phenomenon. No ascites. Pancreas: There is an apparent 1.4 cm hypoattenuating lesion within the caudal most aspect of the head of the pancreas (image 30, coronal image 69, series 6). This finding is without associated downstream pancreatic ductal dilatation or atrophy. Spleen: Normal appearance of the spleen Adrenals/Urinary Tract: There is symmetric enhancement and excretion of the bilateral kidneys. Bilateral punctate subcentimeter hypoattenuating renal lesions too small to adequately characterize though favored to represent renal cysts. No renal stones. No urinary obstruction. Normal appearance the bilateral adrenal glands. Normal appearance of the urinary bladder given degree of distention. Stomach/Bowel: Large hiatal hernia. Extensive enteric wall thickening involving the gastric antrum (image 21, series 3 extending to involve the majority of the proximal small bowel with a relative mild sparing of the distal terminal ileum. There is fluid distention of the cecum and ascending colon with wall thickening involving the descending and sigmoid colon extending to the level the rectum. Scattered colonic diverticulosis without evidence of diverticulitis. Small enough fluid is seen within the lower abdomen without evidence of perforation or definable/drainable fluid collection. No pneumoperitoneum, pneumatosis or portal venous gas. Vascular/Lymphatic: Moderate amount of eccentric mixed calcified and noncalcified atherosclerotic plaque with a normal caliber abdominal aorta. The branch vessels of the abdominal aorta appear patent on this non CTA examination. No bulky retroperitoneal, mesenteric, pelvic or inguinal  lymphadenopathy. Reproductive: Potential vaginal prolapse. Post hysterectomy. No discrete adnexal lesion. Other: Regional soft tissues appear normal. Musculoskeletal: No acute or aggressive  osseous abnormalities. Mild-to-moderate multilevel lumbar spine DDD, worse at L4-L5 and L5-S1 with disc space height loss, endplate irregularity and sclerosis. Moderate severe degenerative change the right hip with joint space loss, subchondral sclerosis and osteophytosis. IMPRESSION: 1. Rather extensive bowel wall thickening involving the gastric antrum, majority of the proximal small bowel as well as the descending and sigmoid colon to the level the rectum. Findings are nonspecific though given extent, may be of infectious etiology with additional differential and considerations including inflammatory and ischemic etiologies. Clinical correlation is advised. No evidence of enteric obstruction or definable/drainable fluid collection. 2. Probable 1.4 cm cystic lesion within the uncinate process of the pancreas similar to the 2007 examination and thus of benign etiology. 3. Aortic Atherosclerosis (ICD10-I70.0). 4. Large hiatal hernia. Electronically Signed   By: Simonne Come M.D.   On: 10/21/2016 14:16   Dg Chest Port 1 View  Result Date: 10/21/2016 CLINICAL DATA:  Nausea, vomiting and diarrhea since this morning. Smoker. EXAM: PORTABLE CHEST 1 VIEW COMPARISON:  06/13/2016. FINDINGS: Normal sized heart. Tortuous aorta. Clear lungs with diffusely prominent interstitial markings. Normal vascularity. No pleural fluid. Diffuse osteopenia. Moderate right AC joint degenerative changes. IMPRESSION: No acute abnormality.  Mild chronic interstitial lung disease. Electronically Signed   By: Beckie Salts M.D.   On: 10/21/2016 12:39   Dg Abd Portable 1 View  Result Date: 10/21/2016 CLINICAL DATA:  Vomiting. EXAM: PORTABLE ABDOMEN - 1 VIEW COMPARISON:  Abdomen and pelvis CT dated 06/06/2016. FINDINGS: Paucity of intestinal gas. No  visible dilated bowel loops. Cholecystectomy clips. Mild lumbar spine degenerative changes. Right hip degenerative changes. IMPRESSION: No acute abnormality. Electronically Signed   By: Beckie Salts M.D.   On: 10/21/2016 12:38       Subjective: Abdominal pain much improved. Denies further nausea, vomiting or diarrhea. No further blood in stool.Tolerating advanced diet.  Discharge Exam: Vitals:   10/24/16 0501 10/24/16 0846  BP: (!) 121/53 (!) 122/54  Pulse: 73 70  Resp: 16 18  Temp: 98.9 F (37.2 C) 98.7 F (37.1 C)  SpO2: 98% 97%   Vitals:   10/23/16 0904 10/23/16 1705 10/24/16 0501 10/24/16 0846  BP: (!) 107/47 (!) 134/58 (!) 121/53 (!) 122/54  Pulse: 73 86 73 70  Resp: 16 17 16 18   Temp: 98.7 F (37.1 C) 98.3 F (36.8 C) 98.9 F (37.2 C) 98.7 F (37.1 C)  TempSrc: Oral Oral Oral Oral  SpO2: 98% 100% 98% 97%  Weight:      Height:       Gen.: Elderly female not in distress HEENT: Moist mucosa, supple neck Chest: Clear to auscultation bilaterally CVS: Normal S1 and S2, no murmurs GI: Soft, nondistended, mild infraumbilical tenderness ( improved from previous days).  bowel sounds present Musculoskeletal: Warm, no edema     The results of significant diagnostics from this hospitalization (including imaging, microbiology, ancillary and laboratory) are listed below for reference.     Microbiology: Recent Results (from the past 240 hour(s))  Gastrointestinal Panel by PCR , Stool     Status: None   Collection Time: 10/21/16 11:30 AM  Result Value Ref Range Status   Campylobacter species NOT DETECTED NOT DETECTED Final   Plesimonas shigelloides NOT DETECTED NOT DETECTED Final   Salmonella species NOT DETECTED NOT DETECTED Final   Yersinia enterocolitica NOT DETECTED NOT DETECTED Final   Vibrio species NOT DETECTED NOT DETECTED Final   Vibrio cholerae NOT DETECTED NOT DETECTED Final   Enteroaggregative E coli (EAEC) NOT DETECTED  NOT DETECTED Final    Enteropathogenic E coli (EPEC) NOT DETECTED NOT DETECTED Final   Enterotoxigenic E coli (ETEC) NOT DETECTED NOT DETECTED Final   Shiga like toxin producing E coli (STEC) NOT DETECTED NOT DETECTED Final   Shigella/Enteroinvasive E coli (EIEC) NOT DETECTED NOT DETECTED Final   Cryptosporidium NOT DETECTED NOT DETECTED Final   Cyclospora cayetanensis NOT DETECTED NOT DETECTED Final   Entamoeba histolytica NOT DETECTED NOT DETECTED Final   Giardia lamblia NOT DETECTED NOT DETECTED Final   Adenovirus F40/41 NOT DETECTED NOT DETECTED Final   Astrovirus NOT DETECTED NOT DETECTED Final   Norovirus GI/GII NOT DETECTED NOT DETECTED Final   Rotavirus A NOT DETECTED NOT DETECTED Final   Sapovirus (I, II, IV, and V) NOT DETECTED NOT DETECTED Final  C difficile quick scan w PCR reflex     Status: None   Collection Time: 10/21/16 11:30 AM  Result Value Ref Range Status   C Diff antigen NEGATIVE NEGATIVE Final   C Diff toxin NEGATIVE NEGATIVE Final   C Diff interpretation No C. difficile detected.  Final  Blood culture (routine x 2)     Status: None (Preliminary result)   Collection Time: 10/21/16 12:19 PM  Result Value Ref Range Status   Specimen Description BLOOD LEFT ANTECUBITAL  Final   Special Requests   Final    BOTTLES DRAWN AEROBIC AND ANAEROBIC Blood Culture adequate volume   Culture NO GROWTH 2 DAYS  Final   Report Status PENDING  Incomplete  Blood culture (routine x 2)     Status: None (Preliminary result)   Collection Time: 10/21/16 12:27 PM  Result Value Ref Range Status   Specimen Description BLOOD RIGHT HAND  Final   Special Requests   Final    BOTTLES DRAWN AEROBIC ONLY Blood Culture adequate volume   Culture NO GROWTH 2 DAYS  Final   Report Status PENDING  Incomplete     Labs: BNP (last 3 results) No results for input(s): BNP in the last 8760 hours. Basic Metabolic Panel:  Recent Labs Lab 10/21/16 1223 10/21/16 1236 10/22/16 0428 10/23/16 0501  NA 143 145 142 141  K  3.4* 3.4* 4.1 3.7  CL 120* 118* 120* 115*  CO2 16*  --  18* 22  GLUCOSE 126* 123* 99 79  BUN 21* 25* 20 13  CREATININE 1.14* 0.90 1.12* 0.89  CALCIUM 8.1*  --  7.5* 7.7*   Liver Function Tests:  Recent Labs Lab 10/21/16 1223  AST 31  ALT 17  ALKPHOS 48  BILITOT 1.0  PROT 5.2*  ALBUMIN 2.7*    Recent Labs Lab 10/21/16 1223  LIPASE 103*   No results for input(s): AMMONIA in the last 168 hours. CBC:  Recent Labs Lab 10/21/16 1223  10/22/16 0428 10/22/16 1222 10/22/16 2000 10/23/16 0501 10/23/16 1255  WBC 9.7  --  11.1*  --   --  13.1*  --   NEUTROABS 7.8*  --   --   --   --   --   --   HGB 12.0  < > 9.8* 9.4* 8.7* 8.8* 8.9*  HCT 34.1*  < > 27.7* 26.6* 24.7* 25.4* 25.4*  MCV 82.0  --  81.0  --   --  81.2  --   PLT 317  --  278  --   --  238  --   < > = values in this interval not displayed. Cardiac Enzymes: No results for input(s): CKTOTAL, CKMB, CKMBINDEX, TROPONINI  in the last 168 hours. BNP: Invalid input(s): POCBNP CBG:  Recent Labs Lab 10/21/16 1032  GLUCAP 158*   D-Dimer No results for input(s): DDIMER in the last 72 hours. Hgb A1c No results for input(s): HGBA1C in the last 72 hours. Lipid Profile No results for input(s): CHOL, HDL, LDLCALC, TRIG, CHOLHDL, LDLDIRECT in the last 72 hours. Thyroid function studies  Recent Labs  10/21/16 1619  TSH 1.023   Anemia work up No results for input(s): VITAMINB12, FOLATE, FERRITIN, TIBC, IRON, RETICCTPCT in the last 72 hours. Urinalysis    Component Value Date/Time   COLORURINE AMBER (A) 10/21/2016 1145   APPEARANCEUR CLEAR 10/21/2016 1145   LABSPEC 1.014 10/21/2016 1145   PHURINE 5.0 10/21/2016 1145   GLUCOSEU NEGATIVE 10/21/2016 1145   HGBUR NEGATIVE 10/21/2016 1145   BILIRUBINUR SMALL (A) 10/21/2016 1145   KETONESUR 5 (A) 10/21/2016 1145   PROTEINUR 100 (A) 10/21/2016 1145   UROBILINOGEN 1.0 10/14/2014 0745   NITRITE NEGATIVE 10/21/2016 1145   LEUKOCYTESUR NEGATIVE 10/21/2016 1145    Sepsis Labs Invalid input(s): PROCALCITONIN,  WBC,  LACTICIDVEN Microbiology Recent Results (from the past 240 hour(s))  Gastrointestinal Panel by PCR , Stool     Status: None   Collection Time: 10/21/16 11:30 AM  Result Value Ref Range Status   Campylobacter species NOT DETECTED NOT DETECTED Final   Plesimonas shigelloides NOT DETECTED NOT DETECTED Final   Salmonella species NOT DETECTED NOT DETECTED Final   Yersinia enterocolitica NOT DETECTED NOT DETECTED Final   Vibrio species NOT DETECTED NOT DETECTED Final   Vibrio cholerae NOT DETECTED NOT DETECTED Final   Enteroaggregative E coli (EAEC) NOT DETECTED NOT DETECTED Final   Enteropathogenic E coli (EPEC) NOT DETECTED NOT DETECTED Final   Enterotoxigenic E coli (ETEC) NOT DETECTED NOT DETECTED Final   Shiga like toxin producing E coli (STEC) NOT DETECTED NOT DETECTED Final   Shigella/Enteroinvasive E coli (EIEC) NOT DETECTED NOT DETECTED Final   Cryptosporidium NOT DETECTED NOT DETECTED Final   Cyclospora cayetanensis NOT DETECTED NOT DETECTED Final   Entamoeba histolytica NOT DETECTED NOT DETECTED Final   Giardia lamblia NOT DETECTED NOT DETECTED Final   Adenovirus F40/41 NOT DETECTED NOT DETECTED Final   Astrovirus NOT DETECTED NOT DETECTED Final   Norovirus GI/GII NOT DETECTED NOT DETECTED Final   Rotavirus A NOT DETECTED NOT DETECTED Final   Sapovirus (I, II, IV, and V) NOT DETECTED NOT DETECTED Final  C difficile quick scan w PCR reflex     Status: None   Collection Time: 10/21/16 11:30 AM  Result Value Ref Range Status   C Diff antigen NEGATIVE NEGATIVE Final   C Diff toxin NEGATIVE NEGATIVE Final   C Diff interpretation No C. difficile detected.  Final  Blood culture (routine x 2)     Status: None (Preliminary result)   Collection Time: 10/21/16 12:19 PM  Result Value Ref Range Status   Specimen Description BLOOD LEFT ANTECUBITAL  Final   Special Requests   Final    BOTTLES DRAWN AEROBIC AND ANAEROBIC Blood Culture  adequate volume   Culture NO GROWTH 2 DAYS  Final   Report Status PENDING  Incomplete  Blood culture (routine x 2)     Status: None (Preliminary result)   Collection Time: 10/21/16 12:27 PM  Result Value Ref Range Status   Specimen Description BLOOD RIGHT HAND  Final   Special Requests   Final    BOTTLES DRAWN AEROBIC ONLY Blood Culture adequate volume   Culture  NO GROWTH 2 DAYS  Final   Report Status PENDING  Incomplete     Time coordinating discharge: Over 30 minutes  SIGNED:   Eddie North, MD  [facility] 10/24/2016, 10:33 AM Pager   If 7PM-7AM, please contact night-coverage [email or web address] Password [email or web address]

## 2016-10-24 NOTE — Progress Notes (Signed)
Kimberly Gregory to be D/C'd Home per MD order.  Discussed prescriptions and follow up appointments with the patient. Prescriptions given to patient, medication list explained in detail. Pt verbalized understanding.  Allergies as of 10/24/2016   No Known Allergies     Medication List    STOP taking these medications   diphenoxylate-atropine 2.5-0.025 MG tablet Commonly known as:  LOMOTIL   enalapril 5 MG tablet Commonly known as:  VASOTEC   ondansetron 4 MG disintegrating tablet Commonly known as:  ZOFRAN ODT   pantoprazole 20 MG tablet Commonly known as:  PROTONIX     TAKE these medications   ALPRAZolam 0.5 MG tablet Commonly known as:  XANAX Take 0.5 mg by mouth daily as needed for anxiety or sleep.   cholecalciferol 1000 units tablet Commonly known as:  VITAMIN D Take 1,000 Units by mouth daily.   lovastatin 40 MG tablet Commonly known as:  MEVACOR Take 40 mg by mouth daily.   vitamin B-12 250 MCG tablet Commonly known as:  CYANOCOBALAMIN Take 250 mcg by mouth daily.   vitamin C 100 MG tablet Take 100 mg by mouth daily.       Vitals:   10/24/16 0501 10/24/16 0846  BP: (!) 121/53 (!) 122/54  Pulse: 73 70  Resp: 16 18  Temp: 98.9 F (37.2 C) 98.7 F (37.1 C)  SpO2: 98% 97%    Skin clean, dry and intact without evidence of skin break down, no evidence of skin tears noted. IV catheter discontinued intact. Site without signs and symptoms of complications. Dressing and pressure applied. Pt denies pain at this time. No complaints noted.  An After Visit Summary was printed and given to the patient. Patient escorted via WC, and D/C home via private auto.  Britt BologneseAnisha Mabe RN, BSN

## 2016-10-25 LAB — BLOOD CULTURE ID PANEL (REFLEXED)
Acinetobacter baumannii: NOT DETECTED
CANDIDA ALBICANS: NOT DETECTED
CANDIDA GLABRATA: NOT DETECTED
CANDIDA PARAPSILOSIS: NOT DETECTED
CANDIDA TROPICALIS: NOT DETECTED
Candida krusei: NOT DETECTED
Carbapenem resistance: NOT DETECTED
ESCHERICHIA COLI: NOT DETECTED
Enterobacter cloacae complex: NOT DETECTED
Enterobacteriaceae species: NOT DETECTED
Enterococcus species: NOT DETECTED
HAEMOPHILUS INFLUENZAE: NOT DETECTED
KLEBSIELLA PNEUMONIAE: NOT DETECTED
Klebsiella oxytoca: NOT DETECTED
LISTERIA MONOCYTOGENES: NOT DETECTED
METHICILLIN RESISTANCE: NOT DETECTED
Neisseria meningitidis: NOT DETECTED
PROTEUS SPECIES: NOT DETECTED
Pseudomonas aeruginosa: NOT DETECTED
SERRATIA MARCESCENS: NOT DETECTED
STREPTOCOCCUS PNEUMONIAE: NOT DETECTED
STREPTOCOCCUS PYOGENES: NOT DETECTED
Staphylococcus aureus (BCID): NOT DETECTED
Staphylococcus species: NOT DETECTED
Streptococcus agalactiae: NOT DETECTED
Streptococcus species: NOT DETECTED
Vancomycin resistance: NOT DETECTED

## 2016-10-26 LAB — CULTURE, BLOOD (ROUTINE X 2)
CULTURE: NO GROWTH
SPECIAL REQUESTS: ADEQUATE

## 2016-10-29 LAB — CULTURE, BLOOD (ROUTINE X 2): Special Requests: ADEQUATE

## 2017-06-21 ENCOUNTER — Encounter (HOSPITAL_COMMUNITY): Payer: Self-pay | Admitting: Emergency Medicine

## 2017-06-21 ENCOUNTER — Other Ambulatory Visit: Payer: Self-pay

## 2017-06-21 ENCOUNTER — Emergency Department (HOSPITAL_COMMUNITY)
Admission: EM | Admit: 2017-06-21 | Discharge: 2017-06-21 | Disposition: A | Discharge status: Home / Self Care | Payer: Medicare Other | Attending: Emergency Medicine | Admitting: Emergency Medicine

## 2017-06-21 DIAGNOSIS — Z5321 Procedure and treatment not carried out due to patient leaving prior to being seen by health care provider: Secondary | ICD-10-CM | POA: Insufficient documentation

## 2017-06-21 DIAGNOSIS — M1990 Unspecified osteoarthritis, unspecified site: Secondary | ICD-10-CM | POA: Insufficient documentation

## 2017-06-21 DIAGNOSIS — G8929 Other chronic pain: Secondary | ICD-10-CM | POA: Insufficient documentation

## 2017-06-21 LAB — CBC
HCT: 40.5 % (ref 36.0–46.0)
HEMOGLOBIN: 14.4 g/dL (ref 12.0–15.0)
MCH: 30.4 pg (ref 26.0–34.0)
MCHC: 35.6 g/dL (ref 30.0–36.0)
MCV: 85.6 fL (ref 78.0–100.0)
Platelets: 256 10*3/uL (ref 150–400)
RBC: 4.73 MIL/uL (ref 3.87–5.11)
RDW: 15.4 % (ref 11.5–15.5)
WBC: 7 10*3/uL (ref 4.0–10.5)

## 2017-06-21 LAB — COMPREHENSIVE METABOLIC PANEL
ALBUMIN: 4 g/dL (ref 3.5–5.0)
ALK PHOS: 61 U/L (ref 38–126)
ALT: 14 U/L (ref 14–54)
AST: 23 U/L (ref 15–41)
Anion gap: 12 (ref 5–15)
BUN: 20 mg/dL (ref 6–20)
CALCIUM: 9.1 mg/dL (ref 8.9–10.3)
CHLORIDE: 106 mmol/L (ref 101–111)
CO2: 21 mmol/L — AB (ref 22–32)
CREATININE: 0.85 mg/dL (ref 0.44–1.00)
GFR calc non Af Amer: >60 mL/min (ref >60)
GLUCOSE: 93 mg/dL (ref 65–99)
Potassium: 3.3 mmol/L — ABNORMAL LOW (ref 3.5–5.1)
SODIUM: 139 mmol/L (ref 135–145)
Total Bilirubin: 1.1 mg/dL (ref 0.3–1.2)
Total Protein: 7.4 g/dL (ref 6.5–8.1)

## 2017-06-21 NOTE — ED Triage Notes (Addendum)
Patient from home with daughter, history of dementia. Patient's husband died in December 2018. Daughter states the patient has is not eating sufficiently and chronic pain from arthritis has gotten worse. Patient lives alone but is regularly visited by multiple family members that live within walking distance of patient's home. Denies SI/HI, when patient states that her husband sits at home and stares at her, she is referring to a large portrait of her husband that is hanging on the wall in her living room. Patient denies hallucinations.

## 2017-06-21 NOTE — ED Notes (Signed)
Pt stated that she wanted to leave. I informed pt that we want to see and treat her for the pain that she is having. Pt stated that her head is hurting really bad and just want to go back home to talking something for pain and come back if it gets worse.

## 2018-08-05 IMAGING — CT CT HEAD W/O CM
5 of 7 series · 17 of 47 positions shown, 18 images · non-contrast
Comparison: MR brain 10/14/2014.

CLINICAL DATA: Patient fell this morning and found by spouse on
floor. Poor historian.

EXAM:
CT HEAD WITHOUT CONTRAST
CT CERVICAL SPINE WITHOUT CONTRAST
TECHNIQUE: Multidetector CT imaging of the head and cervical spine was
performed following the standard protocol without intravenous
contrast. Multiplanar CT image reconstructions of the cervical spine
were also generated.

[Series 4: head without · axial · non-contrast · 0.45mm/px · z∈[-46,+9]mm · 2 of 33 slices shown, 3 images]
[im 11/33  brain]
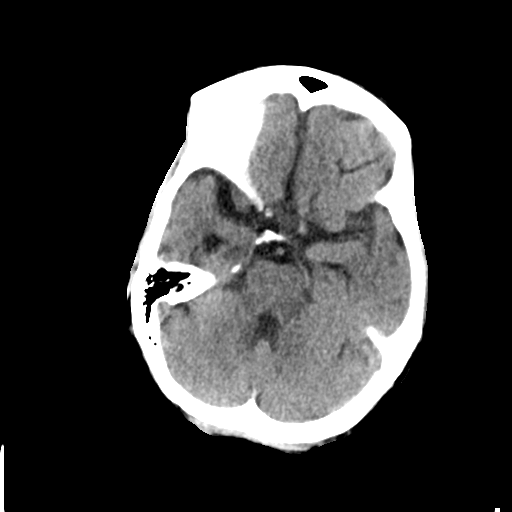
[im 11/33  bone]
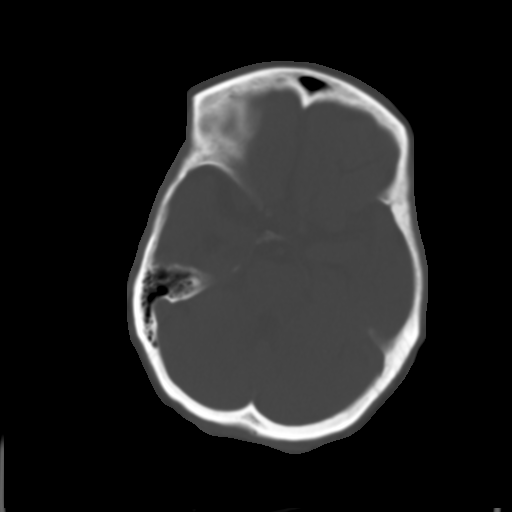
[im 22/33  brain]
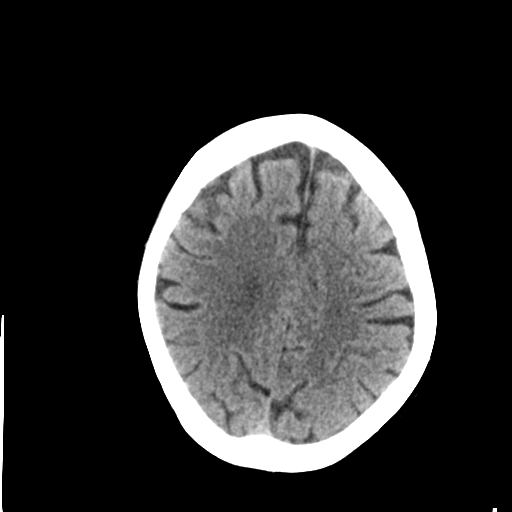

[Series 5: head bone · axial · 0.45mm/px · z∈[-82,+48]mm · 8 of 81 slices shown]
[im 8/81  bone]
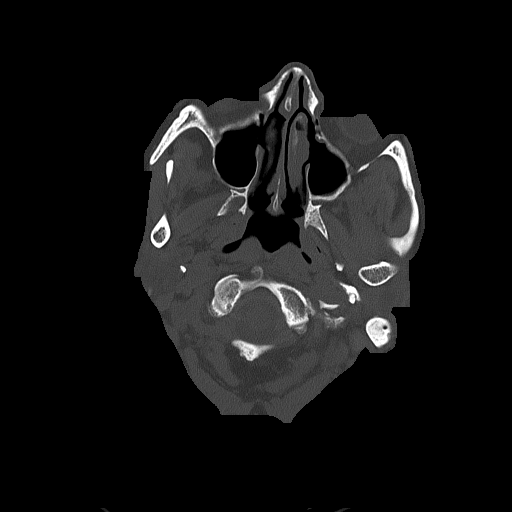
[im 15/81  bone]
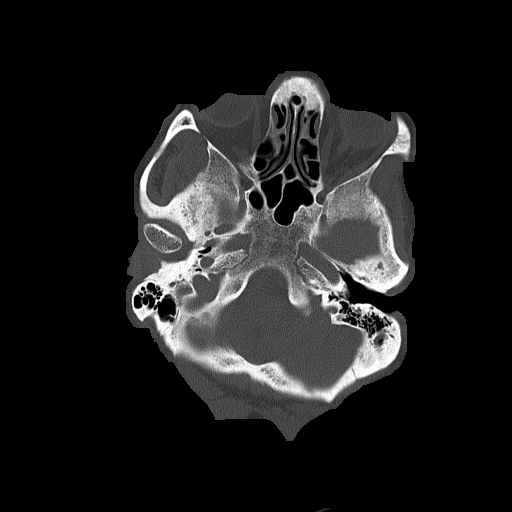
[im 30/81  bone]
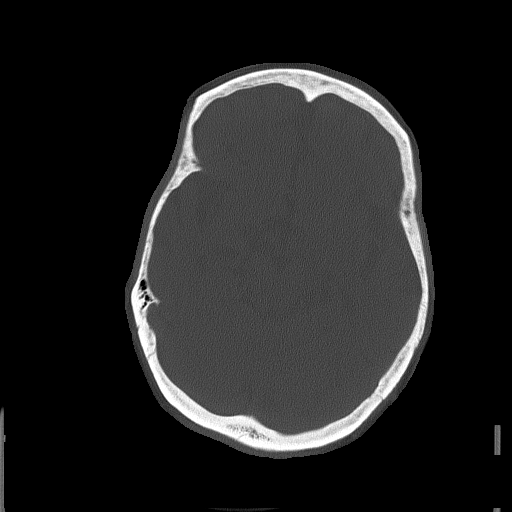
[im 37/81  bone]
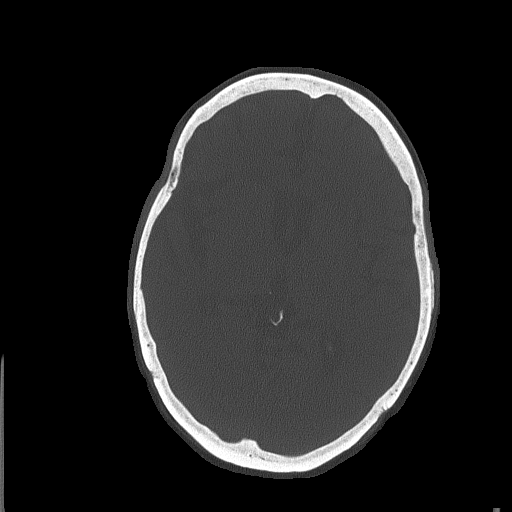
[im 44/81  bone]
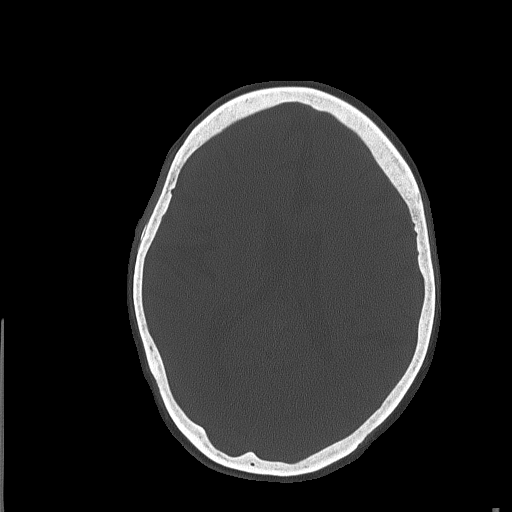
[im 51/81  bone]
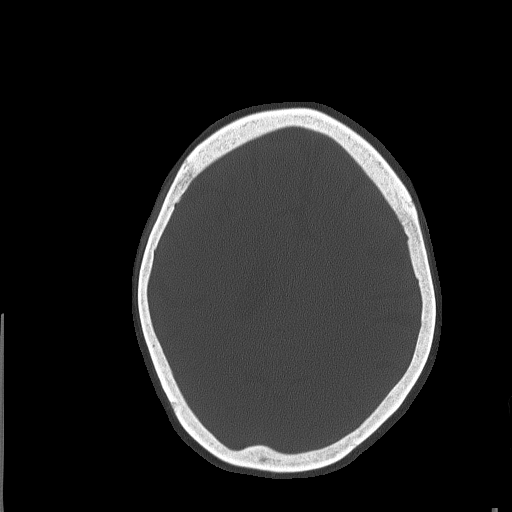
[im 66/81  bone]
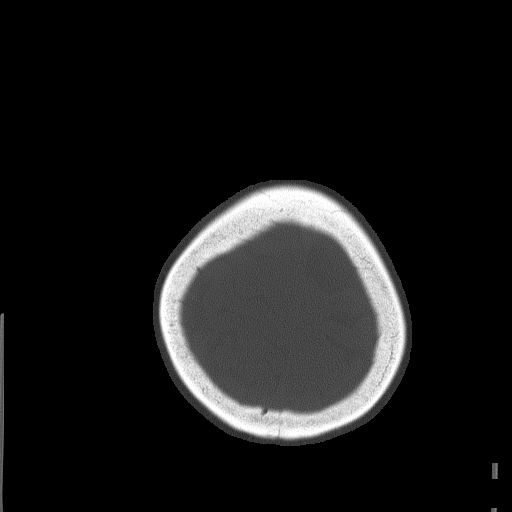
[im 73/81  bone]
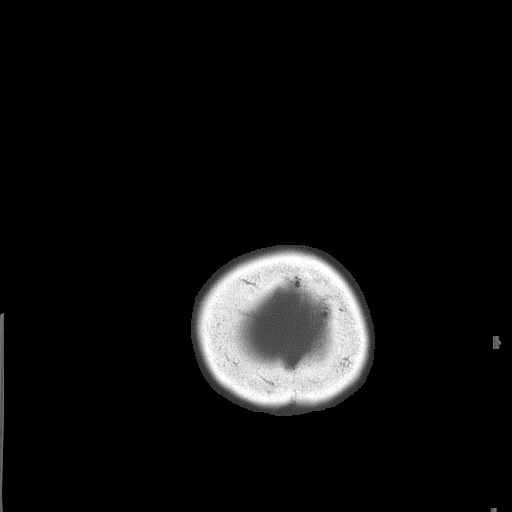

[Series 6: head without cor · coronal · non-contrast · 0.31mm/px · 3 of 69 slices shown]
[im 4/69  brain]
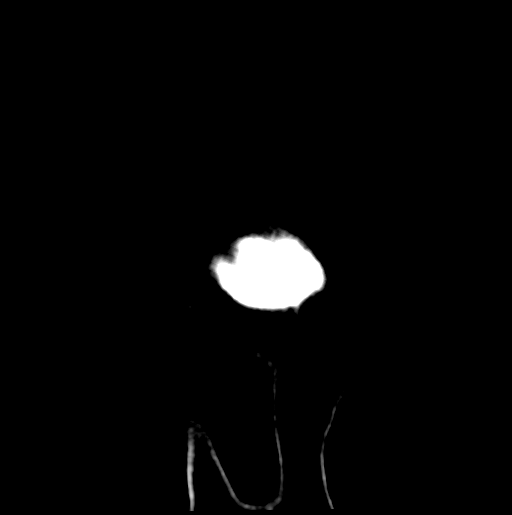
[im 7/69  brain]
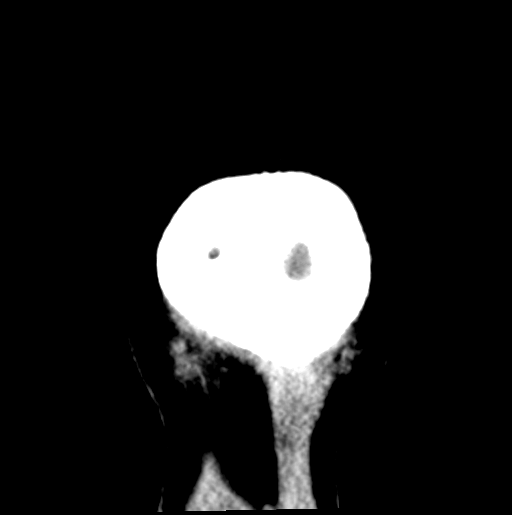
[im 10/69  brain]
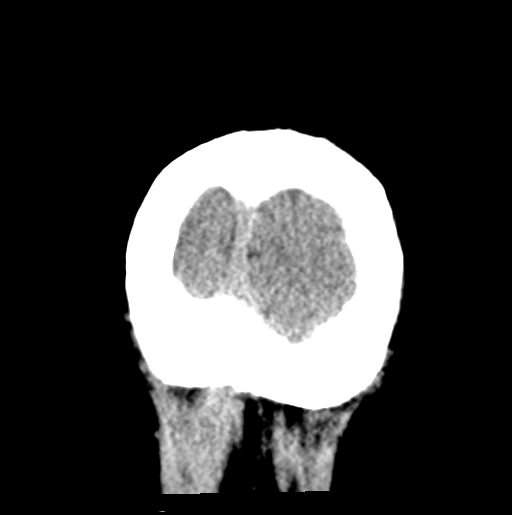

[Series 7: head without sag · sagittal · non-contrast · 0.32mm/px · 1 of 50 slices shown]
[im 25/50  brain]
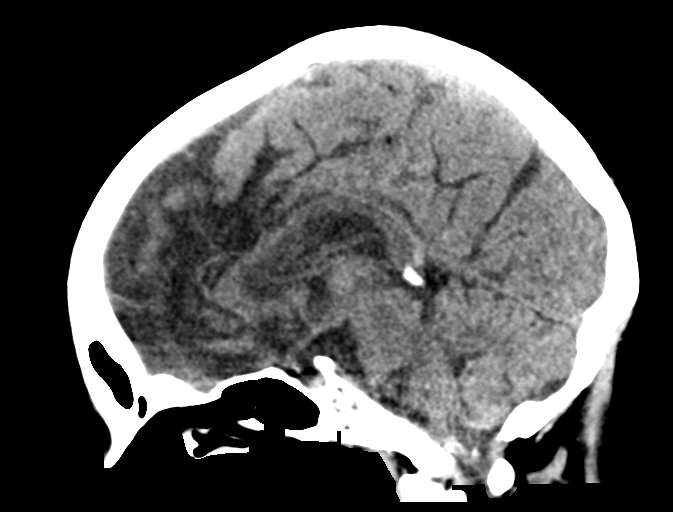

[Series 8: c_spine 2.0 st · axial · 0.33mm/px · z∈[-222,-178]mm · 3 of 97 slices shown]
[im 8/97  brain]
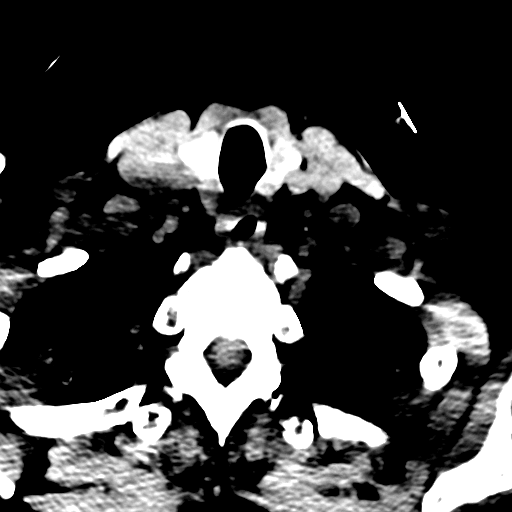
[im 23/97  brain]
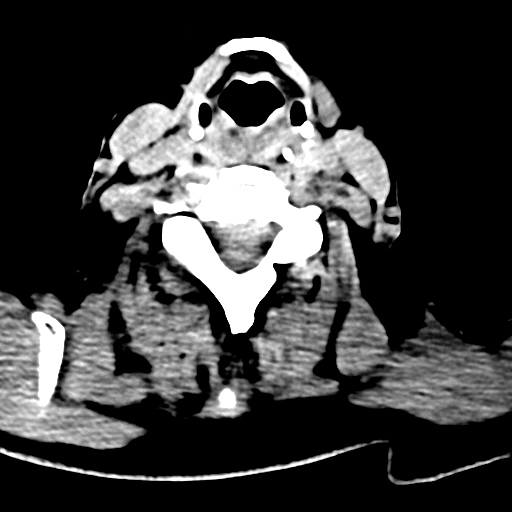
[im 30/97  brain]
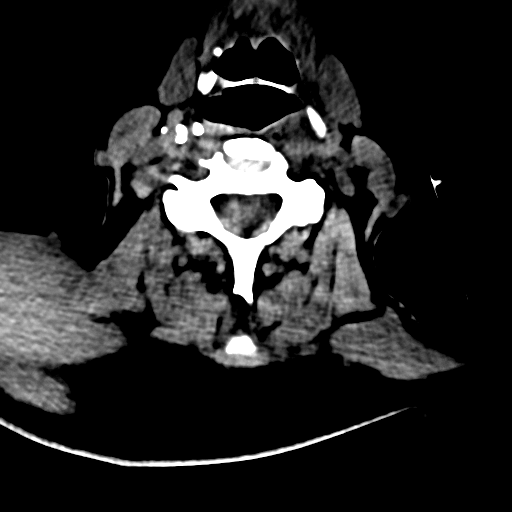

[17 of 47 positions shown; findings below may reference images not displayed]

FINDINGS: CT HEAD FINDINGS

Brain: No evidence of acute infarction, hemorrhage, hydrocephalus,
extra-axial collection or mass lesion/mass effect. Moderate atrophy.
Mild hypoattenuation of white matter, favored to represent chronic
microvascular ischemic change.

Vascular: No hyperdense vessel. Cavernous carotid calcification not
unexpected for age.

Skull: Normal. Negative for fracture or focal lesion.

Sinuses/Orbits: No acute finding.

Other: None.  Similar appearance to prior MR brain.

CT CERVICAL SPINE FINDINGS

Alignment: Anatomic

Skull base and vertebrae: No fracture or worrisome osseous lesion.

Soft tissues and spinal canal: No prevertebral fluid or swelling. No
visible canal hematoma. Atherosclerosis.

Disc levels:  Intervertebral disc spaces appear preserved.

Upper chest: Biapical pleural thickening, incompletely evaluated.
Recommend chest radiograph. No pneumothorax.

Other: None.
IMPRESSION: Atrophy and small vessel disease.  No acute intracranial findings.

No cervical spine fracture or traumatic subluxation.

Biapical pleural thickening, incompletely evaluated. Recommend chest
radiograph

## 2018-08-13 ENCOUNTER — Other Ambulatory Visit: Payer: Self-pay

## 2018-08-13 ENCOUNTER — Encounter: Payer: Self-pay | Admitting: Adult Health Nurse Practitioner

## 2018-08-13 ENCOUNTER — Ambulatory Visit: Payer: Medicare Other | Admitting: Adult Health Nurse Practitioner

## 2018-08-13 ENCOUNTER — Ambulatory Visit: Payer: Self-pay | Admitting: Adult Health

## 2018-08-13 VITALS — BP 122/70 | HR 74 | Temp 97.7°F | Ht 64.25 in | Wt 147.2 lb

## 2018-08-13 DIAGNOSIS — R600 Localized edema: Secondary | ICD-10-CM

## 2018-08-13 DIAGNOSIS — Z1389 Encounter for screening for other disorder: Secondary | ICD-10-CM

## 2018-08-13 DIAGNOSIS — I10 Essential (primary) hypertension: Secondary | ICD-10-CM

## 2018-08-13 DIAGNOSIS — E559 Vitamin D deficiency, unspecified: Secondary | ICD-10-CM | POA: Diagnosis not present

## 2018-08-13 DIAGNOSIS — F0390 Unspecified dementia without behavioral disturbance: Secondary | ICD-10-CM

## 2018-08-13 DIAGNOSIS — G43819 Other migraine, intractable, without status migrainosus: Secondary | ICD-10-CM

## 2018-08-13 DIAGNOSIS — F17219 Nicotine dependence, cigarettes, with unspecified nicotine-induced disorders: Secondary | ICD-10-CM | POA: Diagnosis not present

## 2018-08-13 DIAGNOSIS — Z79899 Other long term (current) drug therapy: Secondary | ICD-10-CM | POA: Diagnosis not present

## 2018-08-13 DIAGNOSIS — R7309 Other abnormal glucose: Secondary | ICD-10-CM

## 2018-08-13 DIAGNOSIS — E782 Mixed hyperlipidemia: Secondary | ICD-10-CM

## 2018-08-13 DIAGNOSIS — Z720 Tobacco use: Secondary | ICD-10-CM | POA: Diagnosis not present

## 2018-08-13 DIAGNOSIS — R8290 Unspecified abnormal findings in urine: Secondary | ICD-10-CM

## 2018-08-13 DIAGNOSIS — M25541 Pain in joints of right hand: Secondary | ICD-10-CM

## 2018-08-13 MED ORDER — LOVASTATIN 40 MG PO TABS: 40.0000 mg | ORAL_TABLET | Freq: Every day | ORAL | 0 refills | Status: DC

## 2018-08-13 NOTE — Progress Notes (Addendum)
Establish Care  Assessment and Plan:  Kimberly Gregory was seen today for establish care and joint swelling.  Discussed goals of care with patient and son.  They both would like least amount of medication as they use OTC supplements and remedies and for concerns of medication overdose.  Will contact son as requested with lab results and dicussed need for cholesterol medication.  Safety in the home is the biggest concern especially with current tobacco use.  Diagnoses and all orders for this visit:  Benign essential HTN  No medications at this time Does not check at home DASH diet, exercise and monitor at home.  Call if greater than 140/90.  -CBC -CMP  Dementia without behavioral disturbance, unspecified dementia type (HCC) Family checking on patient Patient denies having any memory issues or concerns  Mixed hyperlipidemia Was taking Lovastatin Would like to discuss stopping this related to benefit and age -Lipids  Medication management Continued  Abnormal glucose Discussed dietary and exercise modifications -A1c -Insulin Vitamin D deficiency Continue supplementation for this Will check Vit D  Screening for blood or protein in urine -UA  Other migraine without status migrainosus, intractable Has not had a migraine for long time No prescriptive medications Continue to monitor this  Arthralgia of right hand Discussed PRN Aleve 1-2 a day or tylenol no more than 3,000mg  in 24hours. Warm moist heat in the morning or warm shower. Will check CRP, sed rate, ANA, RA factor  Nicotine Dependence Discussed smoking cessation, not ready to quit at this time Will continue to assess readiness.  Edema, Right lower extremity Discussed compression stocking for this Patient needs to increase activity NOT sit in recliner for hours Increase water intake Decrease, monitor salt intake Contact office with new or worsening symptoms   Discussed med's effects and SE's. Screening labs and  tests as requested with regular follow-up as recommended. Over 40 minutes of exam, counseling, chart review, and complex, high level critical decision making was performed this visit.   HPI  83 y.o. female  presents to establish care.  She does not go to the doctors office often but family has had some increasing concerns in regards to her memory.  She does take cholesterol medication daily.  She has been using CBD oil for her pain, knees and right hand.  Reports that she has.  She lives alone and does well with her ADL's.  Her son is here with her today.  Reports that he check on his mother every other day and his sisters also assist with checking in.  She repotrs that she is having edema to her RLE.  Even after resting at night her edema does not go down.    She has history has Vertigo; Lower urinary tract infectious disease; Benign essential HTN; Hyperlipidemia; Acute encephalopathy; AKI (acute kidney injury) (HCC); Acute colitis; Enteritis; Acute metabolic encephalopathy; SIRS (systemic inflammatory response syndrome) (HCC); Hypokalemia; Metabolic acidosis; Dementia (HCC); Laxative abuse; Vomiting and diarrhea; Dehydration; and Ischemic colitis (HCC) on their problem list.. Her son reports that she was taking xanax 0.5mg  for her nerves.  She took too many as she could not remember how many she had taken.  He found her in the home confused and lethargic.  She denies this happening and reports she knows what medications she is supposed to take.  He reports this medications has not been refilled and she has been doing well without this.  Otherwise she was taking Lovastatin 40mg , vitamin D, Vitamin C and Vitamin B12 daily.  Although the consistency of this is not known. She has been having a progressive increase in bilateral hand pain.  Reports the right bothers her more than the left.  They ache in the morning and the right for greater than .  She reports some increase in size of her joints on the right  hand.  She does not take anything for this.  She is a current tobacco smoker and smokes about  Her blood pressure has been controlled and she does not check it at home, today their BP is BP: 122/70 She does workout. She denies chest pain, shortness of breath, dizziness.   She is on cholesterol medication and denies myalgias. Her cholesterol has not been checked for two years. The cholesterol last visit was:  No results found for: CHOL, HDL, LDLCALC, LDLDIRECT, TRIG, CHOLHDL  She has not been working on diet and exercise to prevent prediabetes, she is not on bASA, she is not on ACE/ARB and denies foot ulcerations, increased appetite, nausea, paresthesia of the feet, polydipsia, polyuria, visual disturbances, vomiting and weight loss. Last A1C in the office was: No results found for: HGBA1C  Last GFR: Lab Results  Component Value Date   GFRNONAA >60 06/21/2017   Lab Results  Component Value Date   GFRAA >60 06/21/2017    Patient is on Vitamin D supplement.   No results found for: VD25OH    Current Medications:  No current outpatient medications on file prior to visit.   No current facility-administered medications on file prior to visit.    Allergies:  No Known Allergies Medical History:  She has Vertigo; Lower urinary tract infectious disease; Benign essential HTN; Hyperlipidemia; Acute encephalopathy; AKI (acute kidney injury) (HCC); Acute colitis; Enteritis; Acute metabolic encephalopathy; SIRS (systemic inflammatory response syndrome) (HCC); Hypokalemia; Metabolic acidosis; Dementia (HCC); Laxative abuse; Vomiting and diarrhea; Dehydration; and Ischemic colitis (HCC) on their problem list. Health Maintenance:   There is no immunization history for the selected administration types on file for this patient. Unknown, will review records from previous primary care  Tetanus: 2017 Pneumovax:DUE? Prevnar 13: DUE? Flu vaccine: due for 2020 Zostavax:?   MGM: 2016 DEXA:  DUE? Colonoscopy: 2005   Last Dental Exam: DUE Last Eye Exam: DUE  Patient Care Team: Lucky Cowboy, MD as PCP - General (Internal Medicine)  Surgical History:  She has a past surgical history that includes Bladder surgery; Abdominal hysterectomy; Cataract extraction; Cholecystectomy; Knee surgery (Right); Eye surgery; and Colonoscopy (2005). Family History:  Herfamily history is not on file. Social History:  She reports that she has been smoking cigarettes. She has a 27.50 pack-year smoking history. She has never used smokeless tobacco. She reports that she does not drink alcohol or use drugs.  Review of Systems: Review of Systems  Constitutional: Negative for chills, diaphoresis, fever, malaise/fatigue and weight loss.  HENT: Negative for congestion, ear discharge, ear pain, hearing loss, nosebleeds, sinus pain, sore throat and tinnitus.   Eyes: Negative for blurred vision, double vision, photophobia, pain, discharge and redness.  Respiratory: Positive for cough. Negative for hemoptysis, sputum production, shortness of breath, wheezing and stridor.        Chronic cough from smoking  Cardiovascular: Negative for chest pain, palpitations, orthopnea, claudication, leg swelling and PND.  Gastrointestinal: Negative for abdominal pain, blood in stool, constipation, diarrhea, heartburn, melena, nausea and vomiting.  Genitourinary: Negative for dysuria, flank pain, frequency, hematuria and urgency.  Musculoskeletal: Positive for joint pain and myalgias. Negative for back pain, falls and neck  pain.       Bilateral hands ache, mostly right.  Skin: Negative for itching and rash.  Neurological: Negative for dizziness, tingling, tremors, sensory change, speech change, focal weakness, seizures, loss of consciousness, weakness and headaches.  Endo/Heme/Allergies: Negative for environmental allergies and polydipsia. Does not bruise/bleed easily.  Psychiatric/Behavioral: Negative for depression,  hallucinations, memory loss, substance abuse and suicidal ideas. The patient is not nervous/anxious and does not have insomnia.     Physical Exam: Estimated body mass index is 25.07 kg/m as calculated from the following:   Height as of this encounter: 5' 4.25" (1.632 m).   Weight as of this encounter: 147 lb 3.2 oz (66.8 kg). BP 122/70   Pulse 74   Temp 97.7 F (36.5 C)   Ht 5' 4.25" (1.632 m)   Wt 147 lb 3.2 oz (66.8 kg)   SpO2 96%   BMI 25.07 kg/m  General Appearance: Thin, in no apparent distress.  Eyes: PERRLA, EOMs, conjunctiva no swelling or erythema, normal fundi and vessels.  ENT/Mouth:  Hearing normal.  Respiratory: Respiratory effort normal, BS equal bilaterally without rales, rhonchi, wheezing or stridor.  Cardio: RRR without murmurs, rubs or gallops. Brisk peripheral pulses, +2 edeam RLE. Chest: symmetric, with normal excursions and percussion.  Abdomen: Soft, nontender, no guarding, rebound, hernias, masses, or organomegaly.  Musculoskeletal: Full ROM all peripheral extremities,5/5 strength, and normal gait.  Skin: Warm, dry without rashes, lesions, ecchymosis. Neuro: Cranial nerves intact, reflexes equal bilaterally. Normal muscle tone, no cerebellar symptoms. Sensation intact.  Psych: Awake and oriented X 3, normal affect, Insight and Judgment appropriate.   EKG: Will check at physical appointment   Kimberly Gregory 3:40 PM [facility]

## 2018-08-13 NOTE — Patient Instructions (Addendum)
COMPRESSION STOCKINGS, Try getting thigh high compression for the right leg.   HOME CARE INSTRUCTIONS   Do not stand or sit in one position for long periods of time. Do not sit with your legs crossed. Rest with your legs raised during the day.  Your legs have to be higher than your heart so that gravity will force the valves to open, so please really elevate your legs.   Wear elastic stockings or support hose. Do not wear other tight, encircling garments around the legs, pelvis, or waist.  [employer]  has a wide variety of well priced compression stockings. [place], [place] [id number] [id number] has a good cheap selection, I like the socks, they are not as hard to get on  Walk as much as possible to increase blood flow.  Raise the foot of your bed at night with 2-inch blocks. SEEK MEDICAL CARE IF:   The skin around your ankle starts to break down.  You have pain, redness, tenderness, or hard swelling developing in your leg over a vein.  You are uncomfortable due to leg pain. Document Released: 12/07/2004 Document Revised: 05/22/2011 Document Reviewed: 04/25/2010 [facility] Patient Information 2014 [place].   Increase amount of water you drink, at least 80 oz  Elevate your legs above your heart DO NOT SIT IN RECLINER with feet flat on floor.   We will contact you in 1-3 days with your lab results.     - Vit D  And Vit C 1,000 mg  are recommended to help protect  against the Covid_19 and other Corona viruses.   - Also it's recommended to take Zinc 50 mg to help  protect against the Covid_19  And best place to get  is also on [employer].com and don't pay more than 6-8 cents /pill !   ================================ Coronavirus (COVID-19) Are you at risk?  Are you at risk for the Coronavirus (COVID-19)?  To be considered HIGH RISK for Coronavirus (COVID-19), you have to meet the following criteria:  . Traveled to [place], [place],  [place], [place] or [place]; or in the [place] to [place], [place], [place]  . or [place]; and have fever, cough, and shortness of breath within the last 2 weeks of travel OR . Been in close contact with a person diagnosed with COVID-19 within the last 2 weeks and have  . fever, cough,and shortness of breath .  . IF YOU DO NOT MEET THESE CRITERIA, YOU ARE CONSIDERED LOW RISK FOR COVID-19.  What to do if you are HIGH RISK for COVID-19?  Marland Kitchen If you are having a medical emergency, call 911. . Seek medical care right away. Before you go to a doctor's office, urgent care or emergency department, .  call ahead and tell them about your recent travel, contact with someone diagnosed with COVID-19  .  and your symptoms.  . You should receive instructions from your physician's office regarding next steps of care.  . When you arrive at healthcare provider, tell the healthcare staff immediately you have returned from  . visiting [place], [place], [place], [place] or [place]; or traveled in the [place] to [place], [place],  . [place] or [place] in the last two weeks or you have been in close contact with a person diagnosed with  . COVID-19 in the last 2 weeks.   . Tell the health care staff about your symptoms: fever, cough and shortness  of breath. . After you have been seen by a medical provider, you will be either: o Tested for (COVID-19) and discharged home on quarantine except to seek medical care if  o symptoms worsen, and asked to  - Stay home and avoid contact with others until you get your results (4-5 days)  - Avoid travel on public transportation if possible (such as bus, train, or airplane) or o Sent to the Emergency Department by EMS for evaluation, COVID-19 testing  and  o possible admission depending on your condition and test results.  What to do if you are LOW RISK for COVID-19?  Reduce your risk of any infection by using the same precautions used for avoiding  the common cold or flu:  Marland Kitchen Wash your hands often with soap and warm water for at least 20 seconds.  If soap and water are not readily available,  . use an alcohol-based hand sanitizer with at least 60% alcohol.  . If coughing or sneezing, cover your mouth and nose by coughing or sneezing into the elbow areas of your shirt or coat, .  into a tissue or into your sleeve (not your hands). . Avoid shaking hands with others and consider head nods or verbal greetings only. . Avoid touching your eyes, nose, or mouth with unwashed hands.  . Avoid close contact with people who are sick. . Avoid places or events with large numbers of people in one location, like concerts or sporting events. . Carefully consider travel plans you have or are making. . If you are planning any travel outside or inside the [place], visit the CDC's Travelers' Health webpage for the latest health notices. . If you have some symptoms but not all symptoms, continue to monitor at home and seek medical attention  . if your symptoms worsen. . If you are having a medical emergency, call 911.   . >>>>>>>>>>>>>>>>>>>>>>>>>>>>>>>>> . We Do NOT Approve of  [facility], [employer]  Our Patients  To Do Home Visits & We Do NOT Approve of [employer] > > > > > > > > > > > > > > > > > > > > > > > > > > > > > > > > > > > > > > >  Preventive Care for Adults  A healthy lifestyle and preventive care can promote health and wellness. Preventive health guidelines for women include the following key practices.  A routine yearly physical is a good way to check with your health care provider about your health and preventive screening. It is a chance to share any concerns and updates on your health and to receive a thorough exam.  Visit your dentist for a routine exam and preventive care every 6 months. Brush your teeth twice a day and floss once a day. Good oral hygiene prevents tooth decay and gum disease.  The frequency  of eye exams is based on your age, health, family medical history, use of contact lenses, and other factors. Follow your health care provider's recommendations for frequency of eye exams.  Eat a healthy diet. Foods like vegetables, fruits, whole grains, low-fat dairy products, and lean protein foods contain the nutrients you need without too many calories. Decrease your intake of foods high in solid fats, added sugars, and salt. Eat the right amount of calories for you. Get information about a proper diet from your health care provider, if necessary.  Regular physical exercise is one of the most important things you can  do for your health. Most adults should get at least 150 minutes of moderate-intensity exercise (any activity that increases your heart rate and causes you to sweat) each week. In addition, most adults need muscle-strengthening exercises on 2 or more days a week.  Maintain a healthy weight. The body mass index (BMI) is a screening tool to identify possible weight problems. It provides an estimate of body fat based on height and weight. Your health care provider can find your BMI and can help you achieve or maintain a healthy weight. For adults 20 years and older:  A BMI below 18.5 is considered underweight.  A BMI of 18.5 to 24.9 is normal.  A BMI of 25 to 29.9 is considered overweight.  A BMI of 30 and above is considered obese.  Maintain normal blood lipids and cholesterol levels by exercising and minimizing your intake of saturated fat. Eat a balanced diet with plenty of fruit and vegetables. If your lipid or cholesterol levels are high, you are over 50, or you are at high risk for heart disease, you may need your cholesterol levels checked more frequently. Ongoing high lipid and cholesterol levels should be treated with medicines if diet and exercise are not working.  If you smoke, find out from your health care provider how to quit. If you do not use tobacco, do not  start.  Lung cancer screening is recommended for adults aged 39-80 years who are at high risk for developing lung cancer because of a history of smoking. A yearly low-dose CT scan of the lungs is recommended for people who have at least a 30-pack-year history of smoking and are a current smoker or have quit within the past 15 years. A pack year of smoking is smoking an average of 1 pack of cigarettes a day for 1 year (for example: 1 pack a day for 30 years or 2 packs a day for 15 years). Yearly screening should continue until the smoker has stopped smoking for at least 15 years. Yearly screening should be stopped for people who develop a health problem that would prevent them from having lung cancer treatment.  Avoid use of street drugs. Do not share needles with anyone. Ask for help if you need support or instructions about stopping the use of drugs.  High blood pressure causes heart disease and increases the risk of stroke.  Ongoing high blood pressure should be treated with medicines if weight loss and exercise do not work.  If you are 61-40 years old, ask your health care provider if you should take aspirin to prevent strokes.  Diabetes screening involves taking a blood sample to check your fasting blood sugar level. This should be done once every 3 years, after age 1, if you are within normal weight and without risk factors for diabetes. Testing should be considered at a younger age or be carried out more frequently if you are overweight and have at least 1 risk factor for diabetes.  Breast cancer screening is essential preventive care for women. You should practice "breast self-awareness." This means understanding the normal appearance and feel of your breasts and may include breast self-examination. Any changes detected, no matter how small, should be reported to a health care provider. Women in their 49s and 30s should have a clinical breast exam (CBE) by a health care provider as part of a  regular health exam every 1 to 3 years. After age 57, women should have a CBE every year. Starting at age 51,  women should consider having a mammogram (breast X-ray test) every year. Women who have a family history of breast cancer should talk to their health care provider about genetic screening. Women at a high risk of breast cancer should talk to their health care providers about having an MRI and a mammogram every year.  Breast cancer gene (BRCA)-related cancer risk assessment is recommended for women who have family members with BRCA-related cancers. BRCA-related cancers include breast, ovarian, tubal, and peritoneal cancers. Having family members with these cancers may be associated with an increased risk for harmful changes (mutations) in the breast cancer genes BRCA1 and BRCA2. Results of the assessment will determine the need for genetic counseling and BRCA1 and BRCA2 testing.  Routine pelvic exams to screen for cancer are no longer recommended for nonpregnant women who are considered low risk for cancer of the pelvic organs (ovaries, uterus, and vagina) and who do not have symptoms. Ask your health care provider if a screening pelvic exam is right for you.  If you have had past treatment for cervical cancer or a condition that could lead to cancer, you need Pap tests and screening for cancer for at least 20 years after your treatment. If Pap tests have been discontinued, your risk factors (such as having a new sexual partner) need to be reassessed to determine if screening should be resumed. Some women have medical problems that increase the chance of getting cervical cancer. In these cases, your health care provider may recommend more frequent screening and Pap tests.    Colorectal cancer can be detected and often prevented. Most routine colorectal cancer screening begins at the age of 17 years and continues through age 57 years. However, your health care provider may recommend screening at an  earlier age if you have risk factors for colon cancer. On a yearly basis, your health care provider may provide home test kits to check for hidden blood in the stool. Use of a small camera at the end of a tube, to directly examine the colon (sigmoidoscopy or colonoscopy), can detect the earliest forms of colorectal cancer. Talk to your health care provider about this at age 38, when routine screening begins.  Direct exam of the colon should be repeated every 5-10 years through age 74 years, unless early forms of pre-cancerous polyps or small growths are found.  Osteoporosis is a disease in which the bones lose minerals and strength with aging. This can result in serious bone fractures or breaks. The risk of osteoporosis can be identified using a bone density scan. Women ages 66 years and over and women at risk for fractures or osteoporosis should discuss screening with their health care providers. Ask your health care provider whether you should take a calcium supplement or vitamin D to reduce the rate of osteoporosis.  Menopause can be associated with physical symptoms and risks. Hormone replacement therapy is available to decrease symptoms and risks. You should talk to your health care provider about whether hormone replacement therapy is right for you.  Use sunscreen. Apply sunscreen liberally and repeatedly throughout the day. You should seek shade when your shadow is shorter than you. Protect yourself by wearing long sleeves, pants, a wide-brimmed hat, and sunglasses year round, whenever you are outdoors.  Once a month, do a whole body skin exam, using a mirror to look at the skin on your back. Tell your health care provider of new moles, moles that have irregular borders, moles that are larger than a pencil eraser,  or moles that have changed in shape or color.  Stay current with required vaccines (immunizations).  Influenza vaccine. All adults should be immunized every year.  Tetanus, diphtheria,  and acellular pertussis (Td, Tdap) vaccine. Pregnant women should receive 1 dose of Tdap vaccine during each pregnancy. The dose should be obtained regardless of the length of time since the last dose. Immunization is preferred during the 27th-36th week of gestation. An adult who has not previously received Tdap or who does not know her vaccine status should receive 1 dose of Tdap. This initial dose should be followed by tetanus and diphtheria toxoids (Td) booster doses every 10 years. Adults with an unknown or incomplete history of completing a 3-dose immunization series with Td-containing vaccines should begin or complete a primary immunization series including a Tdap dose. Adults should receive a Td booster every 10 years.    Zoster vaccine. One dose is recommended for adults aged 19 years or older unless certain conditions are present.    Pneumococcal 13-valent conjugate (PCV13) vaccine. When indicated, a person who is uncertain of her immunization history and has no record of immunization should receive the PCV13 vaccine. An adult aged 30 years or older who has certain medical conditions and has not been previously immunized should receive 1 dose of PCV13 vaccine. This PCV13 should be followed with a dose of pneumococcal polysaccharide (PPSV23) vaccine. The PPSV23 vaccine dose should be obtained at least 1 or more year(s) after the dose of PCV13 vaccine. An adult aged 81 years or older who has certain medical conditions and previously received 1 or more doses of PPSV23 vaccine should receive 1 dose of PCV13. The PCV13 vaccine dose should be obtained 1 or more years after the last PPSV23 vaccine dose.    Pneumococcal polysaccharide (PPSV23) vaccine. When PCV13 is also indicated, PCV13 should be obtained first. All adults aged 84 years and older should be immunized. An adult younger than age 94 years who has certain medical conditions should be immunized. Any person who resides in a nursing home or  long-term care facility should be immunized. An adult smoker should be immunized. People with an immunocompromised condition and certain other conditions should receive both PCV13 and PPSV23 vaccines. People with human immunodeficiency virus (HIV) infection should be immunized as soon as possible after diagnosis. Immunization during chemotherapy or radiation therapy should be avoided. Routine use of PPSV23 vaccine is not recommended for American Indians, [place] Natives, or people younger than 65 years unless there are medical conditions that require PPSV23 vaccine. When indicated, people who have unknown immunization and have no record of immunization should receive PPSV23 vaccine. One-time revaccination 5 years after the first dose of PPSV23 is recommended for people aged 19-64 years who have chronic kidney failure, nephrotic syndrome, asplenia, or immunocompromised conditions. People who received 1-2 doses of PPSV23 before age 62 years should receive another dose of PPSV23 vaccine at age 56 years or later if at least 5 years have passed since the previous dose. Doses of PPSV23 are not needed for people immunized with PPSV23 at or after age 72 years.   Preventive Services / Frequency  Ages 10 years and over  Blood pressure check.  Lipid and cholesterol check.  Lung cancer screening. / Every year if you are aged 46-80 years and have a 30-pack-year history of smoking and currently smoke or have quit within the past 15 years. Yearly screening is stopped once you have quit smoking for at least 15 years or develop a  health problem that would prevent you from having lung cancer treatment.  Clinical breast exam.** / Every year after age 34 years.   BRCA-related cancer risk assessment.** / For women who have family members with a BRCA-related cancer (breast, ovarian, tubal, or peritoneal cancers).  Mammogram.** / Every year beginning at age 31 years and continuing for as long as you are in good health.  Consult with your health care provider.  Pap test.** / Every 3 years starting at age 30 years through age 71 or 69 years with 3 consecutive normal Pap tests. Testing can be stopped between 65 and 70 years with 3 consecutive normal Pap tests and no abnormal Pap or HPV tests in the past 10 years.  Fecal occult blood test (FOBT) of stool. / Every year beginning at age 9 years and continuing until age 36 years. You may not need to do this test if you get a colonoscopy every 10 years.  Flexible sigmoidoscopy or colonoscopy.** / Every 5 years for a flexible sigmoidoscopy or every 10 years for a colonoscopy beginning at age 27 years and continuing until age 28 years.  Hepatitis C blood test.** / For all people born from [birth year] through 1963 and any individual with known risks for hepatitis C.  Osteoporosis screening.** / A one-time screening for women ages 67 years and over and women at risk for fractures or osteoporosis.  Skin self-exam. / Monthly.  Influenza vaccine. / Every year.  Tetanus, diphtheria, and acellular pertussis (Tdap/Td) vaccine.** / 1 dose of Td every 10 years.  Zoster vaccine.** / 1 dose for adults aged 44 years or older.  Pneumococcal 13-valent conjugate (PCV13) vaccine.** / Consult your health care provider.  Pneumococcal polysaccharide (PPSV23) vaccine.** / 1 dose for all adults aged 83 years and older. Screening for abdominal aortic aneurysm (AAA)  by ultrasound is recommended for people who have history of high blood pressure or who are current or former smokers. ++++++++++++++++++++ Recommend Adult Low Dose Aspirin or  coated  Aspirin 81 mg daily  To reduce risk of Colon Cancer 40 %,  Skin Cancer 26 % ,  Melanoma 46%  and  Pancreatic cancer 60% ++++++++++++++++++++ Vitamin D goal  is between 70-100.  Please make sure that you are taking your Vitamin D as directed.  It is very important as a natural anti-inflammatory  helping hair, skin, and nails, as well as  reducing stroke and heart attack risk.  It helps your bones and helps with mood. It also decreases numerous cancer risks so please take it as directed.  Low Vit D is associated with a 200-300% higher risk for CANCER  and 200-300% higher risk for HEART   ATTACK  &  STROKE.   .....................................Marland Kitchen It is also associated with higher death rate at younger ages,  autoimmune diseases like Rheumatoid arthritis, Lupus, Multiple Sclerosis.    Also many other serious conditions, like depression, Alzheimer's Dementia, infertility, muscle aches, fatigue, fibromyalgia - just to name a few. ++++++++++++++++++ Recommend the book "The END of DIETING" by Dr Excell Seltzer  & the book "The END of DIABETES " by Dr Excell Seltzer At [email or web address] - get book & Audio CD's    Being diabetic has a  300% increased risk for heart attack, stroke, cancer, and alzheimer- type vascular dementia. It is very important that you work harder with diet by avoiding all foods that are white. Avoid white rice (brown & wild rice is OK), white potatoes (sweetpotatoes in moderation is OK), White bread  or wheat bread or anything made out of white flour like bagels, donuts, rolls, buns, biscuits, cakes, pastries, cookies, pizza crust, and pasta (made from white flour & egg whites) - vegetarian pasta or spinach or wheat pasta is OK. Multigrain breads like Arnold's or [employer], or multigrain sandwich thins or flatbreads.  Diet, exercise and weight loss can reverse and cure diabetes in the early stages.  Diet, exercise and weight loss is very important in the control and prevention of complications of diabetes which affects every system in your body, ie. Brain - dementia/stroke, eyes - glaucoma/blindness, heart - heart attack/heart failure, kidneys - dialysis, stomach - gastric paralysis, intestines - malabsorption, nerves - severe painful neuritis, circulation - gangrene & loss of a leg(s), and finally cancer and Alzheimers.    I  recommend avoid fried & greasy foods,  sweets/candy, white rice (brown or wild rice or Quinoa is OK), white potatoes (sweet potatoes are OK) - anything made from white flour - bagels, doughnuts, rolls, buns, biscuits,white and wheat breads, pizza crust and traditional pasta made of white flour & egg white(vegetarian pasta or spinach or wheat pasta is OK).  Multi-grain bread is OK - like multi-grain flat bread or sandwich thins. Avoid alcohol in excess. Exercise is also important.    Eat all the vegetables you want - avoid meat, especially red meat and dairy - especially cheese.  Cheese is the most concentrated form of trans-fats which is the worst thing to clog up our arteries. Veggie cheese is OK which can be found in the fresh produce section at [employer] or [employer] or [employer]  +++++++++++++++++++ DASH Eating Plan  DASH stands for "Dietary Approaches to Stop Hypertension."   The DASH eating plan is a healthy eating plan that has been shown to reduce high blood pressure (hypertension). Additional health benefits may include reducing the risk of type 2 diabetes mellitus, heart disease, and stroke. The DASH eating plan may also help with weight loss. WHAT DO I NEED TO KNOW ABOUT THE DASH EATING PLAN? For the DASH eating plan, you will follow these general guidelines:  Choose foods with a percent daily value for sodium of less than 5% (as listed on the food label).  Use salt-free seasonings or herbs instead of table salt or sea salt.  Check with your health care provider or pharmacist before using salt substitutes.  Eat lower-sodium products, often labeled as "lower sodium" or "no salt added."  Eat fresh foods.  Eat more vegetables, fruits, and low-fat dairy products.  Choose whole grains. Look for the word "whole" as the first word in the ingredient list.  Choose fish   Limit sweets, desserts, sugars, and sugary drinks.  Choose heart-healthy fats.  Eat veggie cheese   Eat  more home-cooked food and less restaurant, buffet, and fast food.  Limit fried foods.  Cook foods using methods other than frying.  Limit canned vegetables. If you do use them, rinse them well to decrease the sodium.  When eating at a restaurant, ask that your food be prepared with less salt, or no salt if possible.                      WHAT FOODS CAN I EAT? Read Dr Fara Olden Fuhrman's books on The End of Dieting & The End of Diabetes  Grains Whole grain or whole wheat bread. Brown rice. Whole grain or whole wheat pasta. Quinoa, bulgur, and whole grain cereals. Low-sodium cereals. Corn or whole  wheat flour tortillas. Whole grain cornbread. Whole grain crackers. Low-sodium crackers.  Vegetables Fresh or frozen vegetables (raw, steamed, roasted, or grilled). Low-sodium or reduced-sodium tomato and vegetable juices. Low-sodium or reduced-sodium tomato sauce and paste. Low-sodium or reduced-sodium canned vegetables.   Fruits All fresh, canned (in natural juice), or frozen fruits.  Protein Products  All fish and seafood.  Dried beans, peas, or lentils. Unsalted nuts and seeds. Unsalted canned beans.  Dairy Low-fat dairy products, such as skim or 1% milk, 2% or reduced-fat cheeses, low-fat ricotta or cottage cheese, or plain low-fat yogurt. Low-sodium or reduced-sodium cheeses.  Fats and Oils Tub margarines without trans fats. Light or reduced-fat mayonnaise and salad dressings (reduced sodium). Avocado. Safflower, olive, or canola oils. Natural peanut or almond butter.  Other Unsalted popcorn and pretzels. The items listed above may not be a complete list of recommended foods or beverages. Contact your dietitian for more options.  +++++++++++++++  WHAT FOODS ARE NOT RECOMMENDED? Grains/ White flour or wheat flour White bread. White pasta. White rice. Refined cornbread. Bagels and croissants. Crackers that contain trans fat.  Vegetables  Creamed or fried vegetables. Vegetables in a .  Regular canned vegetables. Regular canned tomato sauce and paste. Regular tomato and vegetable juices.  Fruits Dried fruits. Canned fruit in light or heavy syrup. Fruit juice.  Meat and Other Protein Products Meat in general - RED meat & White meat.  Fatty cuts of meat. Ribs, chicken wings, all processed meats as bacon, sausage, bologna, salami, fatback, hot dogs, bratwurst and packaged luncheon meats.  Dairy Whole or 2% milk, cream, half-and-half, and cream cheese. Whole-fat or sweetened yogurt. Full-fat cheeses or blue cheese. Non-dairy creamers and whipped toppings. Processed cheese, cheese spreads, or cheese curds.  Condiments Onion and garlic salt, seasoned salt, table salt, and sea salt. Canned and packaged gravies. Worcestershire sauce. Tartar sauce. Barbecue sauce. Teriyaki sauce. Soy sauce, including reduced sodium. Steak sauce. Fish sauce. Oyster sauce. Cocktail sauce. Horseradish. Ketchup and mustard. Meat flavorings and tenderizers. Bouillon cubes. Hot sauce. Tabasco sauce. Marinades. Taco seasonings. Relishes.  Fats and Oils Butter, stick margarine, lard, shortening and bacon fat. Coconut, palm kernel, or palm oils. Regular salad dressings.  Pickles and olives. Salted popcorn and pretzels.  The items listed above may not be a complete list of foods and beverages to avoid.

## 2018-08-15 LAB — COMPLETE METABOLIC PANEL WITH GFR
AG Ratio: 1.5 (calc) (ref 1.0–2.5)
ALT: 12 U/L (ref 6–29)
AST: 20 U/L (ref 10–35)
Albumin: 4.4 g/dL (ref 3.6–5.1)
Alkaline phosphatase (APISO): 76 U/L (ref 37–153)
BUN/Creatinine Ratio: 22 (calc) (ref 6–22)
BUN: 20 mg/dL (ref 7–25)
CO2: 28 mmol/L (ref 20–32)
Calcium: 9.3 mg/dL (ref 8.6–10.4)
Chloride: 104 mmol/L (ref 98–110)
Creat: 0.9 mg/dL — ABNORMAL HIGH (ref 0.60–0.88)
GFR, Est African American: 69 mL/min/{1.73_m2} (ref >=60)
GFR, Est Non African American: 60 mL/min/{1.73_m2} (ref >=60)
Globulin: 3 g/dL (calc) (ref 1.9–3.7)
Glucose, Bld: 89 mg/dL (ref 65–99)
Potassium: 3.9 mmol/L (ref 3.5–5.3)
Sodium: 139 mmol/L (ref 135–146)
Total Bilirubin: 0.4 mg/dL (ref 0.2–1.2)
Total Protein: 7.4 g/dL (ref 6.1–8.1)

## 2018-08-15 LAB — CBC WITH DIFFERENTIAL/PLATELET
Absolute Monocytes: 439 cells/uL (ref 200–950)
Basophils Absolute: 29 {cells}/uL (ref 0–200)
Basophils Relative: 0.5 %
Eosinophils Absolute: 68 cells/uL (ref 15–500)
Eosinophils Relative: 1.2 %
HCT: 39.5 % (ref 35.0–45.0)
Hemoglobin: 13.5 g/dL (ref 11.7–15.5)
Lymphs Abs: 2468 cells/uL (ref 850–3900)
MCH: 29.4 pg (ref 27.0–33.0)
MCHC: 34.2 g/dL (ref 32.0–36.0)
MCV: 86.1 fL (ref 80.0–100.0)
MPV: 9.8 fL (ref 7.5–12.5)
Monocytes Relative: 7.7 %
Neutro Abs: 2696 cells/uL (ref 1500–7800)
Neutrophils Relative %: 47.3 %
Platelets: 287 10*3/uL (ref 140–400)
RBC: 4.59 10*6/uL (ref 3.80–5.10)
RDW: 14.9 % (ref 11.0–15.0)
Total Lymphocyte: 43.3 %
WBC: 5.7 10*3/uL (ref 3.8–10.8)

## 2018-08-15 LAB — TSH: TSH: 1.26 mIU/L (ref 0.40–4.50)

## 2018-08-15 LAB — URINALYSIS W MICROSCOPIC + REFLEX CULTURE
Bacteria, UA: NONE SEEN /HPF
Bilirubin Urine: NEGATIVE
Glucose, UA: NEGATIVE
Hyaline Cast: NONE SEEN /LPF
Ketones, ur: NEGATIVE
Leukocyte Esterase: NEGATIVE
Nitrites, Initial: NEGATIVE
Protein, ur: NEGATIVE
Specific Gravity, Urine: 1.018 (ref 1.001–1.03)
Squamous Epithelial / HPF: NONE SEEN /HPF (ref <=5)
WBC, UA: NONE SEEN /HPF (ref 0–5)
pH: 6.5 (ref 5.0–8.0)

## 2018-08-15 LAB — HEMOGLOBIN A1C
Hgb A1c MFr Bld: 5 % of total Hgb (ref <5.7)
Mean Plasma Glucose: 97 (calc)
eAG (mmol/L): 5.4 (calc)

## 2018-08-15 LAB — ANA: Anti Nuclear Antibody (ANA): NEGATIVE

## 2018-08-15 LAB — SEDIMENTATION RATE: Sed Rate: 17 mm/h (ref 0–30)

## 2018-08-15 LAB — LIPID PANEL
Cholesterol: 201 mg/dL — ABNORMAL HIGH (ref <200)
HDL: 57 mg/dL (ref >=50)
LDL Cholesterol (Calc): 123 mg/dL (calc) — ABNORMAL HIGH
Non-HDL Cholesterol (Calc): 144 mg/dL (calc) — ABNORMAL HIGH (ref <130)
Total CHOL/HDL Ratio: 3.5 (calc) (ref <5.0)
Triglycerides: 107 mg/dL (ref <150)

## 2018-08-15 LAB — C-REACTIVE PROTEIN: CRP: 14.5 mg/L — ABNORMAL HIGH (ref <8.0)

## 2018-08-15 LAB — MAGNESIUM: Magnesium: 1.8 mg/dL (ref 1.5–2.5)

## 2018-08-15 LAB — MICROALBUMIN / CREATININE URINE RATIO
Creatinine, Urine: 96 mg/dL (ref 20–275)
Microalb Creat Ratio: 3 mcg/mg creat (ref <30)
Microalb, Ur: 0.3 mg/dL

## 2018-08-15 LAB — NO CULTURE INDICATED

## 2018-08-15 LAB — INSULIN, RANDOM: Insulin: 4.2 u[IU]/mL

## 2018-08-15 LAB — VITAMIN D 25 HYDROXY (VIT D DEFICIENCY, FRACTURES): Vit D, 25-Hydroxy: 20 ng/mL — ABNORMAL LOW (ref 30–100)

## 2018-08-15 LAB — RHEUMATOID FACTOR: Rheumatoid fact SerPl-aCnc: <14 [IU]/mL (ref <14)

## 2018-08-16 ENCOUNTER — Other Ambulatory Visit: Payer: Self-pay | Admitting: Adult Health Nurse Practitioner

## 2018-08-16 NOTE — Progress Notes (Signed)
LVM for son, Danelle Earthly regarding lab results 1100.  Second attempt at 1230.  Would like patient to take Vitamin D for deficiency.  Concerned for compliance with medications related to memory.  Wanted to discussed best way to achieve goal, daily 10,000IU or Rx three times a week?  Cholesterol medication can be stopped as discussed at appointment with goals of care.  Will continue to monitor this.  Will attempt another contact at later date.   [name], NP [facility] 08/16/2018  1:22 PM

## 2018-08-19 DIAGNOSIS — M25541 Pain in joints of right hand: Secondary | ICD-10-CM | POA: Insufficient documentation

## 2018-08-20 MED ORDER — CHOLECALCIFEROL 1.25 MG (50000 UT) PO CAPS: ORAL_CAPSULE | ORAL | 0 refills | Status: DC

## 2018-08-20 NOTE — Addendum Note (Signed)
Addended byGarnet Sierras A on: 08/20/2018 11:05 AM   Modules accepted: Orders

## 2018-08-29 DIAGNOSIS — R0602 Shortness of breath: Secondary | ICD-10-CM | POA: Diagnosis not present

## 2018-09-02 ENCOUNTER — Telehealth: Payer: Self-pay

## 2018-09-02 NOTE — Telephone Encounter (Signed)
Son of patient called. Patient recently was see by Danton Sewer and son feels like she is having anxiety and would like to see if there is something that she can take. Please advise.

## 2018-09-03 MED ORDER — BUSPIRONE HCL 5 MG PO TABS: 5.0000 mg | ORAL_TABLET | Freq: Two times a day (BID) | ORAL | 0 refills | Status: DC

## 2018-09-03 NOTE — Telephone Encounter (Signed)
Son states that he thinks this in anxiety related and was informed that prescription was sent in. Will follow up at next appointment

## 2018-09-03 NOTE — Telephone Encounter (Signed)
Please make sure she is not having any chest pain, shortness of breath, or redness/warmth in swollen leg. If so go to ER. Patient has follow up 09/10/18.  Will send in low dose of buspar to take up to 2 x a day, not addictive, not habit forming medication.   Recent Visits Date Type Provider Dept  08/13/18 Office Visit Garnet Sierras, NP [place] recent visits within past 540 days with a meds authorizing provider and meeting all other requirements   Future Appointments Date Type Provider Dept  09/10/18 Appointment Garnet Sierras, NP [place] future appointments within next 150 days with a meds authorizing provider and meeting all other requirements

## 2018-09-03 NOTE — Telephone Encounter (Signed)
Left message to call back  

## 2018-09-03 NOTE — Addendum Note (Signed)
Addended by: Vladimir Crofts on: 09/03/2018 08:39 AM   Modules accepted: Orders

## 2018-09-09 NOTE — Progress Notes (Signed)
Complete Physical  Assessment and Plan:  Kimberly Gregory was seen today for complete physical.   Diagnoses and all orders for this visit:  Kimberly Gregory was seen today for annual exam, other and memory loss.  Diagnoses and all orders for this visit:  Encounter for annual physical exam Yearly  Benign essential HTN No medications at this time Does not check at home DASH diet, exercise and monitor at home.  Call if greater than 140/90.   Mixed hyperlipidemia Taking Lovastatin Patient and family continuing this Will monitor lipids  Dementia without behavioral disturbance, unspecified dementia type (HCC) Discussed restarting this per patient family request. Continue to monitor Discussed Neurology referral, would like to defer at this time -     donepezil (ARICEPT) 5 MG tablet; Take 1 tablet (5 mg total) by mouth at bedtime.  Vitamin D deficiency Taking supplementation Continue, will monitor labs  Cigarette nicotine dependence with nicotine-induced disorder Discussed cessation with patient and family Patient not ready/desire to quite Continue to assess readiness Discuss safety  Other migraine without status migrainosus, intractable Doing well at this time Continue to monitor  Gastroesophageal reflux disease without esophagitis Denies any symptoms of this No medications Monitor diet and symptoms  Non-recurrent acute serous otitis media of both ears -     cetirizine (ZYRTEC) 5 MG tablet; Take one tablet by mouth every night for 1-2 weeks.  Other depression Increased anxiety as well? -     escitalopram (LEXAPRO) 10 MG tablet; Take 1 tablet (10 mg total) by mouth daily. Discussed medication and side effects Will follow up in 4-6 weeks  Acute pain of right shoulder Muscle strain? Near miss fall? Discussed using ice to area -     meloxicam (MOBIC) 7.5 MG tablet; Take 1 tablet (7.5 mg total) by mouth daily. Daughter to dispense medication to patient daily Monitor  symptoms Consider imaging if no improvement?  Abnormal glucose Discussed dietary and exercise modifications  Medication management Continued.  Screening for cardiovascular condition -     EKG 12-Lead  Screening for blood or protein in urine -     Urinalysis w microscopic + reflex cultur -     REFLEXIVE URINE CULTURE  Edema, Right lower extremity Discussed compression stocking for this Patient needs to increase activity NOT sit in recliner for hours Increase water intake Decrease, monitor salt intake Contact office with new or worsening symptoms   Discussed med's effects and SE's. Screening labs and tests as requested with regular follow-up as recommended. Over 40 minutes of exam, counseling, chart review, and complex, high level critical decision making was performed this visit.   HPI  83 y.o. female  presents for physical. Family has had some increasing concerns in regards to her memory.  She was on Aircept 5mg  in the past and they felt this helped some.  Patient denies having any memory impairment. She is accompanied by her daughter Leanord Asalngela Gregory.  Her and her brother Kimberly Gregory are assisting with her medications and checking on her.  She does live alone and able to complete all of her self care.  Discussed goals of care with patient and daughter.  They both would like least amount of medication as they use OTC supplements and remedies and for concerns of medication overdose which results in previous hospitialization.  Patient is a smoker and reports smoking less than a pack a day.  Daughter reports she lights them and usually ends up burning out.  Discussed safety concerns with this.  She smokes out  on her patio and not inside the home.  Safety in the home is the biggest concern especially with current tobacco use.  She reports her right shoulder soreness today.  It start two to three days ago as reported by her daughter.  Patient is unable to recall when it first started hurting.   She denies any falls, trauma, near miss falls or any new activity to cause the discomfort.  She has limited range of motion to the right shoulder.  She has used some topical bengay and heating pad but that has not made a difference in the pain. Patient is a poor historian.  Discussed mood and anxiety with patient.  She has used some Buspar for anxiety.  She is unable to tell if this has helped.  She reports she is alone in the house since her husband has pasted and repeatedly says her husband is now just on the wall.  She became tearful with conversation.  Also reports she keeps busy in the house and denies any depression.  Daughter reports she has noticed that her mothers mood has been down lately.      Reports that her voice is raspy and her ears feel funny when she talks and it is extra noisy.  Reports some drainage in back of her throat but no soreness.  She denies any water eyes or nasal congestion.  She reports some intermittent dizziness, short duration less than 1min.  She has not taken anything for this.      She was using CBD for her pain, knees and right hand. Has not been using this in past week.  Daughter reports she has not noticed her mother complaining of her knees or hands over the past week.  She has some mild edema to BLE with right more significant than the left.  She reports she does not elevate this leg.  At night the swelling does not reduce while sleeping.    She has history has Vertigo; Lower urinary tract infectious disease; Benign essential HTN; Hyperlipidemia; Acute encephalopathy; AKI (acute kidney injury) (HCC); Acute colitis; Enteritis; Acute metabolic encephalopathy; SIRS (systemic inflammatory response syndrome) (HCC); Hypokalemia; Metabolic acidosis; Dementia (HCC); Laxative abuse; Vomiting and diarrhea; Dehydration; Arthralgia of right hand; Gastroesophageal reflux disease without esophagitis; Cigarette nicotine dependence with nicotine-induced disorder; Vitamin D  deficiency; Abnormal glucose; and Depression on their problem list.. She was prescribed Buspar TID after contact to office regarding anxiety.  She previously used xanax 0.5mg  and there were concerns of taking too many as she was found confused and lethargic.  She is taking suuplements vitamin D, Vitamin C and Vitamin B12 daily.  Although the consistency of this is not known.   Her blood pressure has been controlled and she does not check it at home, today their BP is BP: 122/84 She does workout. She denies chest pain, shortness of breath, dizziness.   She is on cholesterol medication and denies myalgias. Her cholesterol has not been checked for two years. The cholesterol last visit was:   Lab Results  Component Value Date   CHOL 201 (H) 08/13/2018   HDL 57 08/13/2018   LDLCALC 123 (H) 08/13/2018   TRIG 107 08/13/2018   CHOLHDL 3.5 08/13/2018    She has not been working on diet and exercise to prevent prediabetes, she is not on bASA, she is not on ACE/ARB and denies foot ulcerations, increased appetite, nausea, paresthesia of the feet, polydipsia, polyuria, visual disturbances, vomiting and weight loss. Last A1C in the  office was:  Lab Results  Component Value Date   HGBA1C 5.0 08/13/2018    Last GFR: Lab Results  Component Value Date   GFRNONAA 60 08/13/2018   Lab Results  Component Value Date   GFRAA 69 08/13/2018    Patient is on Vitamin D supplement.   Lab Results  Component Value Date   VD25OH 20 (L) 08/13/2018      Current Medications:  Current Outpatient Medications on File Prior to Visit  Medication Sig Dispense Refill  . busPIRone (BUSPAR) 5 MG tablet Take 1 tablet (5 mg total) by mouth 2 (two) times daily. 60 tablet 0  . Cholecalciferol 1.25 MG (50000 UT) capsule Take one tablet by mouth three days a week for twelve weeks. 36 capsule 0  . lovastatin (MEVACOR) 40 MG tablet Take 1 tablet (40 mg total) by mouth daily. 30 tablet 0   No current facility-administered  medications on file prior to visit.    Allergies:  No Known Allergies Medical History:  She has Vertigo; Lower urinary tract infectious disease; Benign essential HTN; Hyperlipidemia; Acute encephalopathy; AKI (acute kidney injury) ([place]); Acute colitis; Enteritis; Acute metabolic encephalopathy; SIRS (systemic inflammatory response syndrome) ([place]); Hypokalemia; Metabolic acidosis; Dementia ([place]); Laxative abuse; Vomiting and diarrhea; Dehydration; Arthralgia of right hand; Gastroesophageal reflux disease without esophagitis; Cigarette nicotine dependence with nicotine-induced disorder; Vitamin D deficiency; Abnormal glucose; and Depression on their problem list. Health Maintenance:   Immunization History  Administered Date(s) Administered  . Pneumococcal Conjugate-13 03/13/2002  . Pneumococcal Polysaccharide-23 03/14/2003  . Zoster 03/13/2013   Unknown, will review records from previous primary care  Tetanus: 2017 Pneumovax:2005 Prevnar 13: 2004 Flu vaccine: due for 2020 Zostavax: 2015   MGM: 2016 DEXA: DUE? Colonoscopy: 2005 ABI - 07/2012  Last Dental Exam: DUE Last Eye Exam: DUE  Patient Care Team: Unk Pinto, MD as PCP - General (Internal Medicine)  Surgical History:  She has a past surgical history that includes Bladder surgery; Abdominal hysterectomy; Cataract extraction; Cholecystectomy; Knee surgery (Right); Eye surgery; and Colonoscopy (2005). Family History:  Herfamily history is not on file. Social History:  She reports that she has been smoking cigarettes. She has a 27.50 pack-year smoking history. She has never used smokeless tobacco. She reports that she does not drink alcohol or use drugs.  Review of Systems: Review of Systems  Constitutional: Negative for chills, diaphoresis, fever, malaise/fatigue and weight loss.  HENT: Positive for ear pain. Negative for congestion, ear discharge, hearing loss, nosebleeds, sinus pain, sore throat and tinnitus.         Ear fullness  Eyes: Negative for blurred vision, double vision, photophobia, pain, discharge and redness.  Respiratory: Positive for cough. Negative for hemoptysis, sputum production, shortness of breath, wheezing and stridor.        Chronic cough from smoking  Cardiovascular: Negative for chest pain, palpitations, orthopnea, claudication, leg swelling and PND.  Gastrointestinal: Negative for abdominal pain, blood in stool, constipation, diarrhea, heartburn, melena, nausea and vomiting.  Genitourinary: Negative for dysuria, flank pain, frequency, hematuria and urgency.  Musculoskeletal: Positive for joint pain and myalgias. Negative for back pain, falls and neck pain.       Right shoulder  Skin: Negative for itching and rash.  Neurological: Negative for dizziness, tingling, tremors, sensory change, speech change, focal weakness, seizures, loss of consciousness, weakness and headaches.  Endo/Heme/Allergies: Negative for environmental allergies and polydipsia. Does not bruise/bleed easily.  Psychiatric/Behavioral: Positive for depression. Negative for hallucinations, memory loss, substance abuse and suicidal ideas.  The patient is nervous/anxious. The patient does not have insomnia.     Physical Exam: Estimated body mass index is 24.9 kg/m as calculated from the following:   Height as of this encounter: 5' 4.25" (1.632 m).   Weight as of this encounter: 146 lb 3.2 oz (66.3 kg). BP 122/84   Temp 97.7 F (36.5 C)   Ht 5' 4.25" (1.632 m)   Wt 146 lb 3.2 oz (66.3 kg)   BMI 24.90 kg/m  General Appearance: Thin, in no apparent distress.  Eyes: PERRLA, EOMs, conjunctiva no swelling or erythema, normal fundi and vessels.  ENT/Mouth:  Hearing, some impairment.  Serous noted behind right TM. No erythema noted post pharynx. Respiratory: Respiratory effort normal, BS equal bilaterally without rales, rhonchi, wheezing or stridor.  Cardio: RRR without murmurs, rubs or gallops. Brisk peripheral pulses,  +1 edeam RLE. Chest: symmetric, with normal excursions and percussion.  Abdomen: Soft, nontender, no guarding, rebound, hernias, masses, or organomegaly.  Musculoskeletal: Full ROM all peripheral extremities,5/5 strength, and normal gait. RUE decreased ROM, shoulder to 90degrees abduction, pain with flexion and extension.  Edema noted from base of neck from C5-7 lateral to acromion process, as well as SITS in shoulder.  No erythema, or ecchymosis noted. Skin: Warm, dry without rashes, lesions, ecchymosis. Neuro: Cranial nerves intact, reflexes equal bilaterally. Normal muscle tone, no cerebellar symptoms. Sensation intact.  Psych: Awake and oriented X 3, normal affect, Insight and Judgment appropriate.    EKG: Old infarct noted, as previous Sinus rhythm, 1st degree AV blocks  Mandie Crabbe 10:51 AM  [facility]

## 2018-09-10 ENCOUNTER — Encounter: Payer: Self-pay | Admitting: Adult Health Nurse Practitioner

## 2018-09-10 ENCOUNTER — Other Ambulatory Visit: Payer: Self-pay

## 2018-09-10 ENCOUNTER — Ambulatory Visit (INDEPENDENT_AMBULATORY_CARE_PROVIDER_SITE_OTHER): Payer: Medicare Other | Admitting: Adult Health Nurse Practitioner

## 2018-09-10 VITALS — BP 122/84 | Temp 97.7°F | Ht 64.25 in | Wt 146.2 lb

## 2018-09-10 DIAGNOSIS — Z1389 Encounter for screening for other disorder: Secondary | ICD-10-CM

## 2018-09-10 DIAGNOSIS — F0390 Unspecified dementia without behavioral disturbance: Secondary | ICD-10-CM

## 2018-09-10 DIAGNOSIS — F17219 Nicotine dependence, cigarettes, with unspecified nicotine-induced disorders: Secondary | ICD-10-CM

## 2018-09-10 DIAGNOSIS — M25511 Pain in right shoulder: Secondary | ICD-10-CM

## 2018-09-10 DIAGNOSIS — Z0000 Encounter for general adult medical examination without abnormal findings: Secondary | ICD-10-CM

## 2018-09-10 DIAGNOSIS — I10 Essential (primary) hypertension: Secondary | ICD-10-CM

## 2018-09-10 DIAGNOSIS — Z79899 Other long term (current) drug therapy: Secondary | ICD-10-CM

## 2018-09-10 DIAGNOSIS — H6503 Acute serous otitis media, bilateral: Secondary | ICD-10-CM

## 2018-09-10 DIAGNOSIS — R8290 Unspecified abnormal findings in urine: Secondary | ICD-10-CM

## 2018-09-10 DIAGNOSIS — E785 Hyperlipidemia, unspecified: Secondary | ICD-10-CM | POA: Diagnosis not present

## 2018-09-10 DIAGNOSIS — R7309 Other abnormal glucose: Secondary | ICD-10-CM

## 2018-09-10 DIAGNOSIS — E782 Mixed hyperlipidemia: Secondary | ICD-10-CM

## 2018-09-10 DIAGNOSIS — Z136 Encounter for screening for cardiovascular disorders: Secondary | ICD-10-CM

## 2018-09-10 DIAGNOSIS — E559 Vitamin D deficiency, unspecified: Secondary | ICD-10-CM

## 2018-09-10 DIAGNOSIS — K219 Gastro-esophageal reflux disease without esophagitis: Secondary | ICD-10-CM

## 2018-09-10 DIAGNOSIS — G43819 Other migraine, intractable, without status migrainosus: Secondary | ICD-10-CM

## 2018-09-10 DIAGNOSIS — R799 Abnormal finding of blood chemistry, unspecified: Secondary | ICD-10-CM | POA: Diagnosis not present

## 2018-09-10 DIAGNOSIS — F3289 Other specified depressive episodes: Secondary | ICD-10-CM

## 2018-09-10 DIAGNOSIS — R600 Localized edema: Secondary | ICD-10-CM

## 2018-09-10 MED ORDER — ESCITALOPRAM OXALATE 10 MG PO TABS
10.0000 mg | ORAL_TABLET | Freq: Every day | ORAL | 2 refills | Status: DC
Start: 2018-09-10 — End: 2019-03-01

## 2018-09-10 MED ORDER — CETIRIZINE HCL 5 MG PO TABS: ORAL_TABLET | ORAL | 1 refills | Status: DC

## 2018-09-10 MED ORDER — DONEPEZIL HCL 5 MG PO TABS
5.0000 mg | ORAL_TABLET | Freq: Every day | ORAL | 2 refills | Status: DC
Start: 2018-09-10 — End: 2019-02-20

## 2018-09-10 MED ORDER — MELOXICAM 7.5 MG PO TABS: 7.5000 mg | ORAL_TABLET | Freq: Every day | ORAL | 2 refills | Status: DC

## 2018-09-10 NOTE — Patient Instructions (Addendum)
Take Zyrtec 5mg  at night.  This will help with itchy ears and dizziness.  Drink more water when taking this and try for two weeks.  Meloxicam 7.5mg  daily for two weeks.  Take this with food to avoid stomach upset.  This will help with inflammation.  Lexapro 10mg  nightly this will help with the shortness of breath feelings, mood.   Aricept 5mg  nightly, this helps with memory   Do these in the order above.  Introduce every few says so you can decipher any side effects.  We will follow up in 6 weeks.  Contact the office if the soulder has not improved after two weeks.  Ice shoulder 20,min at a time.  May do heat as well.  May take tylenol 1,000mg  twice a day to help with pain for the shoulder.

## 2018-09-11 ENCOUNTER — Encounter: Payer: Self-pay | Admitting: Adult Health Nurse Practitioner

## 2018-09-11 DIAGNOSIS — E559 Vitamin D deficiency, unspecified: Secondary | ICD-10-CM | POA: Insufficient documentation

## 2018-09-11 DIAGNOSIS — F17219 Nicotine dependence, cigarettes, with unspecified nicotine-induced disorders: Secondary | ICD-10-CM | POA: Insufficient documentation

## 2018-09-11 DIAGNOSIS — Z87891 Personal history of nicotine dependence: Secondary | ICD-10-CM | POA: Insufficient documentation

## 2018-09-11 DIAGNOSIS — F32A Depression, unspecified: Secondary | ICD-10-CM | POA: Insufficient documentation

## 2018-09-11 DIAGNOSIS — F329 Major depressive disorder, single episode, unspecified: Secondary | ICD-10-CM | POA: Insufficient documentation

## 2018-09-11 DIAGNOSIS — K219 Gastro-esophageal reflux disease without esophagitis: Secondary | ICD-10-CM | POA: Insufficient documentation

## 2018-09-11 DIAGNOSIS — R7309 Other abnormal glucose: Secondary | ICD-10-CM | POA: Insufficient documentation

## 2018-09-11 HISTORY — DX: Depression, unspecified: F32.A

## 2018-09-11 HISTORY — DX: Gastro-esophageal reflux disease without esophagitis: K21.9

## 2018-09-11 LAB — URINALYSIS W MICROSCOPIC + REFLEX CULTURE
Bacteria, UA: NONE SEEN /HPF
Bilirubin Urine: NEGATIVE
Glucose, UA: NEGATIVE
Hyaline Cast: NONE SEEN /LPF
Ketones, ur: NEGATIVE
Leukocyte Esterase: NEGATIVE
Nitrites, Initial: NEGATIVE
Protein, ur: NEGATIVE
Specific Gravity, Urine: 1.018 (ref 1.001–1.03)
Squamous Epithelial / HPF: NONE SEEN /HPF (ref <=5)
pH: 7 (ref 5.0–8.0)

## 2018-09-11 LAB — NO CULTURE INDICATED

## 2018-09-11 NOTE — Progress Notes (Signed)
Encouraged medication to help with episodes, panic attacks?  Was previously on maintenance medication. Concern for increasing depression, not confident screening tool responses are accurate from patient.

## 2018-10-01 ENCOUNTER — Other Ambulatory Visit: Payer: Self-pay | Admitting: Adult Health Nurse Practitioner

## 2018-10-01 ENCOUNTER — Encounter: Payer: Self-pay | Admitting: Adult Health Nurse Practitioner

## 2018-10-01 ENCOUNTER — Ambulatory Visit (INDEPENDENT_AMBULATORY_CARE_PROVIDER_SITE_OTHER): Payer: Medicare Other | Admitting: Adult Health Nurse Practitioner

## 2018-10-01 ENCOUNTER — Other Ambulatory Visit: Payer: Self-pay

## 2018-10-01 VITALS — BP 126/72 | HR 66 | Temp 98.6°F | Ht 64.25 in | Wt 147.8 lb

## 2018-10-01 DIAGNOSIS — R3915 Urgency of urination: Secondary | ICD-10-CM

## 2018-10-01 DIAGNOSIS — R3989 Other symptoms and signs involving the genitourinary system: Secondary | ICD-10-CM

## 2018-10-01 DIAGNOSIS — R300 Dysuria: Secondary | ICD-10-CM | POA: Diagnosis not present

## 2018-10-01 MED ORDER — SULFAMETHOXAZOLE-TRIMETHOPRIM 400-80 MG PO TABS: 1.0000 | ORAL_TABLET | Freq: Two times a day (BID) | ORAL | 0 refills | Status: AC

## 2018-10-01 NOTE — Progress Notes (Signed)
Assessment and Plan:  Kimberly Gregory was seen today for acute visit and other.  Diagnoses and all orders for this visit:  Urinary urgency -     sulfamethoxazole-trimethoprim (BACTRIM) 400-80 MG tablet; Take 1 tablet by mouth 2 (two) times daily for 10 days. -     Urinalysis w microscopic + reflex cultur  Suspected UTI -     Urinalysis w microscopic + reflex cultur       Further disposition pending results of labs. Discussed med's effects and SE's.   Over 30 minutes of interview, exam, counseling, chart review, and critical decision making was performed.   Future Appointments  Date Time Provider [place]  10/22/2018 10:45 AM Kimberly Sierras, NP [facility] None  09/16/2019 10:00 AM Kimberly Gregory, Danton Sewer, NP [facility] None    ------------------------------------------------------------------------------------------------------------------   HPI 83 y.o.female presents for evaluation of urinary urgency.  Reports symptoms started about a week ago.  Her daughter brought her some OTC AZO that did not seem to help. She is accompanied by her daughter today.  Patient has dementia.  She has already provided a UA sample upon arrival.  She does not remember doing this.  She is having frequency and also forgetting that she has just gone to the bathroom and repeating this multiple times during the visit.  When she urinates, not much comes out and she still feels like she has to go few minuets later.  Denies any burning stinging or any blood on tissue when she wipes.  Daughter reports they have been encouraging her to drink more water.    Past Medical History:  Diagnosis Date  . Arthritis   . Chronic kidney disease   . Dementia ([place])   . Depression 09/11/2018  . Gastroesophageal reflux disease without esophagitis 09/11/2018  . Headache   . Hypercholesterolemia   . Hypertension   . Vertigo 10/14/2014     No Known Allergies  Current Outpatient Medications on File Prior to Visit  Medication  Sig  . busPIRone (BUSPAR) 5 MG tablet Take 1 tablet (5 mg total) by mouth 2 (two) times daily.  . cetirizine (ZYRTEC) 5 MG tablet Take one tablet by mouth every night.  . Cholecalciferol 1.25 MG (50000 UT) capsule Take one tablet by mouth three days a week for twelve weeks.  . donepezil (ARICEPT) 5 MG tablet Take 1 tablet (5 mg total) by mouth at bedtime.  Marland Kitchen escitalopram (LEXAPRO) 10 MG tablet Take 1 tablet (10 mg total) by mouth daily.  Marland Kitchen lovastatin (MEVACOR) 40 MG tablet Take 1 tablet (40 mg total) by mouth daily.  . meloxicam (MOBIC) 7.5 MG tablet Take 1 tablet (7.5 mg total) by mouth daily.   No current facility-administered medications on file prior to visit.     ROS: all negative except above.   Physical Exam:  BP 126/72   Pulse 66   Temp 98.6 F (37 C)   Ht 5' 4.25" (1.632 m)   Wt 147 lb 12.8 oz (67 kg)   SpO2 97%   BMI 25.17 kg/m   General Appearance: Well nourished, in no apparent distress. Eyes: PERRLA, EOMs, conjunctiva no swelling or erythema Sinuses: No Frontal/maxillary tenderness ENT/Mouth: Ext aud canals clear, TMs without erythema, bulging. No erythema, swelling, or exudate on post pharynx.  Tonsils not swollen or erythematous. Hearing normal.  Neck: Supple, thyroid normal.  Respiratory: Respiratory effort normal, BS equal bilaterally without rales, rhonchi, wheezing or stridor.  Cardio: RRR with no MRGs. Brisk peripheral pulses without edema.  Abdomen: Soft, +  BS.  Non tender, no guarding, rebound, hernias, masses. Lymphatics: Non tender without lymphadenopathy.  Musculoskeletal: Full ROM, 5/5 strength, normal gait.  Skin: Warm, dry without rashes, lesions, ecchymosis.  Neuro: Cranial nerves intact. Normal muscle tone, no cerebellar symptoms. Sensation intact.  Psych: Awake and oriented X 1, normal affect, Insight and Judgment minimal, baseline related to dementia.Kimberly Gregory.     Kimberly Dobosz, NP 12:42 PM [facility]

## 2018-10-01 NOTE — Patient Instructions (Signed)
We will send your urine for evaluation.  We have sent in Bactrim antibiotic and start this today.  Twice a day for 10 days.  We will contact you via phone with the results.  This will tell us if the antibiotic will kill the bacteria present.  We may need to change therapy pending these results.  Increase the amount of water your drink.  Call or return with new or worsening symptoms.  Monitor for diarrhea or vaginal yeast infection as these are common side effects.

## 2018-10-04 LAB — URINE CULTURE
MICRO NUMBER:: 696880
SPECIMEN QUALITY:: ADEQUATE

## 2018-10-04 LAB — URINALYSIS W MICROSCOPIC + REFLEX CULTURE
Bacteria, UA: NONE SEEN /HPF
Bilirubin Urine: NEGATIVE
Glucose, UA: NEGATIVE
Hyaline Cast: NONE SEEN /LPF
Ketones, ur: NEGATIVE
Leukocyte Esterase: NEGATIVE
Nitrites, Initial: NEGATIVE
Protein, ur: NEGATIVE
RBC / HPF: NONE SEEN /HPF (ref 0–2)
Specific Gravity, Urine: 1.023 (ref 1.001–1.03)
WBC, UA: NONE SEEN /HPF (ref 0–5)
pH: 5.5 (ref 5.0–8.0)

## 2018-10-04 LAB — NO CULTURE INDICATED

## 2018-10-04 LAB — TEST AUTHORIZATION

## 2018-10-21 NOTE — Progress Notes (Signed)
Addendum: on Danelle Earthlyoel to stop by to get samples of myrbetriq for OAB, once a day.  Monitor symptoms.  Discussed medication and side effects.  Continue to encourage water intake.  Call or return with new or worsening symptoms  -Elder NegusKyra Mikisha Roseland, NP 10/24/18  Assessment and Plan:  Para MarchJeanette was seen today for follow up on medication start and urine re-check Diagnoses and all orders for this visit:  Depression Improved Continue Lexapro with benefit Continue to monitor symptoms / behavior  Urinary urgency Completed 10day treatment with bactrim last visit -     Urinalysis w microscopic + reflex cultur UTI versus OAB versus vaginal dryness Consider Mybertriq?  Suspected UTI -     Urinalysis w microscopic + reflex cultur   Acute pain of right shoulder Improved, no longer complains of pain May use Meloxicam PRN   Further disposition pending results of labs. Discussed med's effects and SE's.   Over 30 minutes of interview, exam, counseling, chart review, and critical decision making was performed.   Future Appointments  Date Time Provider Department Center  09/16/2019 10:00 AM Elder NegusMcClanahan, Jajuan Skoog, NP [facility] None    ------------------------------------------------------------------------------------------------------------------   HPI 83 y.o.female presents for evaluation of lexpro that was started 6 weeks ago.   [facility], today.  She has dementia and a poor historian.  He reports reports he and his sisters have noticed an improvement in her mood.  He reports that now when he stops by she is dressed and her self care has improved.  He also reports that she is keeping up with things in her home more than before. Also following up on urinary urgency and follow up from treatment of UTI.  She is still having frequency or urination.  Family reports she will go into the bathroom and then come out, without going to the bathroom.  So they are not sure if she is forgetting  to go.  She denies any pain or discomfort or that it wakes her from sleep.  She did try some over the counter AZO but he does not know if this helped any.     Past Medical History:  Diagnosis Date  . Arthritis   . Chronic kidney disease   . Dementia (HCC)   . Depression 09/11/2018  . Gastroesophageal reflux disease without esophagitis 09/11/2018  . Headache   . Hypercholesterolemia   . Hypertension   . Vertigo 10/14/2014     No Known Allergies  Current Outpatient Medications on File Prior to Visit  Medication Sig  . busPIRone (BUSPAR) 5 MG tablet Take 1 tablet (5 mg total) by mouth 2 (two) times daily.  . cetirizine (ZYRTEC) 5 MG tablet Take one tablet by mouth every night.  . Cholecalciferol 1.25 MG (50000 UT) capsule Take one tablet by mouth three days a week for twelve weeks.  . donepezil (ARICEPT) 5 MG tablet Take 1 tablet (5 mg total) by mouth at bedtime.  Marland Kitchen. escitalopram (LEXAPRO) 10 MG tablet Take 1 tablet (10 mg total) by mouth daily.  Marland Kitchen. lovastatin (MEVACOR) 40 MG tablet Take 1 tablet (40 mg total) by mouth daily.   No current facility-administered medications on file prior to visit.     ROS: all negative except above.   Physical Exam:  BP (!) 108/58   Pulse 65   Temp (!) 97.5 F (36.4 C)   Ht 5' 4.25" (1.632 m)   Wt 143 lb 6.4 oz (65 kg)   SpO2 97%  BMI 24.42 kg/m   General Appearance: Well nourished, in no apparent distress. Eyes: PERRLA, EOMs, conjunctiva no swelling or erythema Sinuses: No Frontal/maxillary tenderness ENT/Mouth: Ext aud canals clear, TMs without erythema, bulging. No erythema, swelling, or exudate on post pharynx.  Tonsils not swollen or erythematous. Hearing normal.  Neck: Supple, thyroid normal.  Respiratory: Respiratory effort normal, BS equal bilaterally without rales, rhonchi, wheezing or stridor.  Cardio: RRR with no MRGs. Brisk peripheral pulses without edema.  Abdomen: Soft, + BS.  Non tender, no guarding, rebound, hernias,  masses. Lymphatics: Non tender without lymphadenopathy.  Musculoskeletal: Full ROM, 5/5 strength, normal gait.  Skin: Warm, dry without rashes, lesions, ecchymosis.  Neuro: Cranial nerves intact. Normal muscle tone, no cerebellar symptoms. Sensation intact.  Psych: Awake and oriented X 1, normal affect, Insight and Judgment minimal, baseline related to dementia.Garnet Sierras, NP 12:42 PM [facility]

## 2018-10-22 ENCOUNTER — Other Ambulatory Visit: Payer: Self-pay

## 2018-10-22 ENCOUNTER — Ambulatory Visit (INDEPENDENT_AMBULATORY_CARE_PROVIDER_SITE_OTHER): Payer: Medicare Other | Admitting: Adult Health Nurse Practitioner

## 2018-10-22 ENCOUNTER — Encounter: Payer: Self-pay | Admitting: Adult Health Nurse Practitioner

## 2018-10-22 VITALS — BP 108/58 | HR 65 | Temp 97.5°F | Ht 64.25 in | Wt 143.4 lb

## 2018-10-22 DIAGNOSIS — R300 Dysuria: Secondary | ICD-10-CM | POA: Diagnosis not present

## 2018-10-22 DIAGNOSIS — R3989 Other symptoms and signs involving the genitourinary system: Secondary | ICD-10-CM

## 2018-10-22 DIAGNOSIS — F3289 Other specified depressive episodes: Secondary | ICD-10-CM

## 2018-10-22 DIAGNOSIS — M25511 Pain in right shoulder: Secondary | ICD-10-CM

## 2018-10-22 MED ORDER — MELOXICAM 7.5 MG PO TABS: 7.5000 mg | ORAL_TABLET | ORAL | 2 refills | Status: DC | PRN

## 2018-10-22 NOTE — Patient Instructions (Signed)
   Increase water intake to 60-80oz a day.  Bring urine sample back to office for lab processing. Monitor symptoms.  May use over the counter AZO couple times a day to help with bladder spasms.  Was this effective from last appointment?  Will contact with lab results.  She may take the Meloxicam as needed for shoulder pain.  Contact us with any new or worsening symptoms.

## 2018-10-23 LAB — URINALYSIS W MICROSCOPIC + REFLEX CULTURE
Bacteria, UA: NONE SEEN /HPF
Bilirubin Urine: NEGATIVE
Glucose, UA: NEGATIVE
Hgb urine dipstick: NEGATIVE
Hyaline Cast: NONE SEEN /LPF
Ketones, ur: NEGATIVE
Leukocyte Esterase: NEGATIVE
Nitrites, Initial: NEGATIVE
Protein, ur: NEGATIVE
Specific Gravity, Urine: 1.024 (ref 1.001–1.03)
Squamous Epithelial / HPF: NONE SEEN /HPF (ref <=5)
WBC, UA: NONE SEEN /HPF (ref 0–5)
pH: <=5 (ref 5.0–8.0)

## 2018-10-23 LAB — NO CULTURE INDICATED

## 2018-10-27 ENCOUNTER — Encounter: Payer: Self-pay | Admitting: Adult Health Nurse Practitioner

## 2018-11-05 ENCOUNTER — Other Ambulatory Visit: Payer: Self-pay | Admitting: Adult Health Nurse Practitioner

## 2018-11-05 DIAGNOSIS — E559 Vitamin D deficiency, unspecified: Secondary | ICD-10-CM

## 2019-01-25 ENCOUNTER — Other Ambulatory Visit: Payer: Self-pay | Admitting: Adult Health Nurse Practitioner

## 2019-01-25 DIAGNOSIS — E559 Vitamin D deficiency, unspecified: Secondary | ICD-10-CM

## 2019-02-18 ENCOUNTER — Encounter: Payer: Self-pay | Admitting: Internal Medicine

## 2019-02-18 ENCOUNTER — Ambulatory Visit (INDEPENDENT_AMBULATORY_CARE_PROVIDER_SITE_OTHER): Payer: Medicare Other | Admitting: Internal Medicine

## 2019-02-18 ENCOUNTER — Other Ambulatory Visit: Payer: Self-pay

## 2019-02-18 VITALS — BP 126/60 | HR 64 | Temp 97.2°F | Resp 16 | Ht 61.75 in | Wt 145.0 lb

## 2019-02-18 DIAGNOSIS — E559 Vitamin D deficiency, unspecified: Secondary | ICD-10-CM | POA: Diagnosis not present

## 2019-02-18 DIAGNOSIS — E782 Mixed hyperlipidemia: Secondary | ICD-10-CM

## 2019-02-18 DIAGNOSIS — Z79899 Other long term (current) drug therapy: Secondary | ICD-10-CM | POA: Diagnosis not present

## 2019-02-18 DIAGNOSIS — R0989 Other specified symptoms and signs involving the circulatory and respiratory systems: Secondary | ICD-10-CM

## 2019-02-18 DIAGNOSIS — R7309 Other abnormal glucose: Secondary | ICD-10-CM

## 2019-02-18 DIAGNOSIS — F0390 Unspecified dementia without behavioral disturbance: Secondary | ICD-10-CM

## 2019-02-18 NOTE — Patient Instructions (Signed)

## 2019-02-18 NOTE — Progress Notes (Signed)
History of Present Illness:      This very nice 83 y.o. WWF presents for 6 month follow up with HTN, HLD, Pre-Diabetes and Vitamin D Deficiency.       Patient is monitored expectantly for HTN & BP has been controlled at home. Today's BP is at goal - 126/60. Patient has had no complaints of any cardiac type chest pain, palpitations, dyspnea / orthopnea / PND, dizziness, claudication, or dependent edema. BP Readings from Last 3 Encounters:  02/18/19 126/60  10/22/18 (!) 108/58  10/01/18 126/72       Hyperlipidemia is controlled with diet & meds. Patient denies myalgias or other med SE's. Last Lipids were not at goal :  Lab Results  Component Value Date   CHOL 177 02/18/2019   HDL 47 (L) 02/18/2019   LDLCALC 108 (H) 02/18/2019   TRIG 114 02/18/2019   CHOLHDL 3.8 02/18/2019        Also, the patient has history of PreDiabetes and has had no symptoms of reactive hypoglycemia, diabetic polys, paresthesias or visual blurring.  Last A1c was at goal:  Lab Results  Component Value Date   HGBA1C 5.1 02/18/2019        Further, the patient also has history of Vitamin D Deficiency and supplements vitamin D without any suspected side-effects. Last vitamin D was elevated:  Lab Results  Component Value Date   VD25OH 132 (H) 02/18/2019    Current Outpatient Medications on File Prior to Visit  Medication Sig  . busPIRone (BUSPAR) 5 MG tablet Take 1 tablet (5 mg total) by mouth 2 (two) times daily.  . cetirizine (ZYRTEC) 5 MG tablet Take one tablet by mouth every night.  . Cholecalciferol (VITAMIN D3) 1.25 MG (50000 UT) CAPS Take 1 capsule 3  /week for Vitamin D Deficiency  . escitalopram (LEXAPRO) 10 MG tablet Take 1 tablet (10 mg total) by mouth daily.  Marland Kitchen lovastatin (MEVACOR) 40 MG tablet Take 1 tablet (40 mg total) by mouth daily.  . meloxicam (MOBIC) 7.5 MG tablet Take 1 tablet (7.5 mg total) by mouth as needed for pain.   No current facility-administered medications on file prior  to visit.    No Known Allergies  PMHx:   Past Medical History:  Diagnosis Date  . Arthritis   . Chronic kidney disease   . Dementia ([place])   . Depression 09/11/2018  . Gastroesophageal reflux disease without esophagitis 09/11/2018  . Headache   . Hypercholesterolemia   . Hypertension   . Vertigo 10/14/2014   Immunization History  Administered Date(s) Administered  . Pneumococcal Conjugate-13 03/13/2002  . Pneumococcal Polysaccharide-23 03/14/2003  . Zoster 03/13/2013   Past Surgical History:  Procedure Laterality Date  . ABDOMINAL HYSTERECTOMY    . BLADDER SURGERY    . CATARACT EXTRACTION    . CHOLECYSTECTOMY    . COLONOSCOPY  2005  . EYE SURGERY    . KNEE SURGERY Right    arthroscopic    FHx:    Reviewed / unchanged  SHx:    Reviewed / unchanged   Systems Review:  Constitutional: Denies fever, chills, wt changes, headaches, insomnia, fatigue, night sweats, change in appetite. Eyes: Denies redness, blurred vision, diplopia, discharge, itchy, watery eyes.  ENT: Denies discharge, congestion, post nasal drip, epistaxis, sore throat, earache, hearing loss, dental pain, tinnitus, vertigo, sinus pain, snoring.  CV: Denies chest pain, palpitations, irregular heartbeat, syncope, dyspnea, diaphoresis, orthopnea, PND, claudication or edema. Respiratory: denies cough, dyspnea, DOE,  pleurisy, hoarseness, laryngitis, wheezing.  Gastrointestinal: Denies dysphagia, odynophagia, heartburn, reflux, water brash, abdominal pain or cramps, nausea, vomiting, bloating, diarrhea, constipation, hematemesis, melena, hematochezia  or hemorrhoids. Genitourinary: Denies dysuria, frequency, urgency, nocturia, hesitancy, discharge, hematuria or flank pain. Musculoskeletal: Denies arthralgias, myalgias, stiffness, jt. swelling, pain, limping or strain/sprain.  Skin: Denies pruritus, rash, hives, warts, acne, eczema or change in skin lesion(s). Neuro: No weakness, tremor, incoordination, spasms,  paresthesia or pain. Psychiatric: Denies confusion, memory loss or sensory loss. Endo: Denies change in weight, skin or hair change.  Heme/Lymph: No excessive bleeding, bruising or enlarged lymph nodes.  Physical Exam  BP 126/60   Pulse 64   Temp (!) 97.2 F (36.2 C)   Resp 16   Ht 5' 1.75" (1.568 m)   Wt 145 lb (65.8 kg)   BMI 26.74 kg/m   Appears  well nourished, well groomed  and in no distress.  Eyes: PERRLA, EOMs, conjunctiva no swelling or erythema. Sinuses: No frontal/maxillary tenderness ENT/Mouth: EAC's clear, TM's nl w/o erythema, bulging. Nares clear w/o erythema, swelling, exudates. Oropharynx clear without erythema or exudates. Oral hygiene is good. Tongue normal, non obstructing. Hearing intact.  Neck: Supple. Thyroid not palpable. Car 2+/2+ without bruits, nodes or JVD. Chest: Respirations nl with BS clear & equal w/o rales, rhonchi, wheezing or stridor.  Cor: Heart sounds normal w/ regular rate and rhythm without sig. murmurs, gallops, clicks or rubs. Peripheral pulses normal and equal  without edema.  Abdomen: Soft & bowel sounds normal. Non-tender w/o guarding, rebound, hernias, masses or organomegaly.  Lymphatics: Unremarkable.  Musculoskeletal: Full ROM all peripheral extremities, joint stability, 5/5 strength and normal gait.  Skin: Warm, dry without exposed rashes, lesions or ecchymosis apparent.  Neuro: Cranial nerves intact, reflexes equal bilaterally. Sensory-motor testing grossly intact. Tendon reflexes grossly intact.  Pysch: Alert & oriented x 3.  Insight and judgement nl & appropriate. No ideations.  Assessment and Plan:  1. Labile hypertension  - Continue medication, monitor blood pressure at home.  - Continue DASH diet.  Reminder to go to the ER if any CP,  SOB, nausea, dizziness, severe HA, changes vision/speech.  - CBC with Diff - COMPLETE METABOLIC PANEL WITH GFR - Magnesium - TSH  2. Hyperlipidemia, mixed  - Continue diet/meds,  exercise,& lifestyle modifications.  - Continue monitor periodic cholesterol/liver & renal functions   - Lipid Profile - TSH  3. Abnormal glucose  - Continue diet, exercise  - Lifestyle modifications.  - Monitor appropriate labs.  - Hemoglobin A1c ([facility]) - Insulin, random  4. Vitamin D deficiency  - Continue supplementation.  - Vitamin D (25 hydroxy)  5. Dementia without behavioral disturbance (HCC)   6. Medication management  - CBC with Diff - COMPLETE METABOLIC PANEL WITH GFR - Magnesium - Lipid Profile - TSH - Hemoglobin A1c ([facility]) - Insulin, random - Vitamin D (25 hydroxy)       Discussed  regular exercise, BP monitoring, weight control to achieve/maintain BMI less than 25 and discussed med and SE's. Recommended labs to assess and monitor clinical status with further disposition pending results of labs.  I discussed the assessment and treatment plan with the patient. The patient was provided an opportunity to ask questions and all were answered. The patient agreed with the plan and demonstrated an understanding of the instructions.  I provided over 30 minutes of exam, counseling, chart review and  complex critical decision making.  Marinus Maw, MD

## 2019-02-19 LAB — CBC WITH DIFFERENTIAL/PLATELET
Absolute Monocytes: 543 {cells}/uL (ref 200–950)
Basophils Absolute: 43 {cells}/uL (ref 0–200)
Basophils Relative: 0.7 %
Eosinophils Absolute: 73 {cells}/uL (ref 15–500)
Eosinophils Relative: 1.2 %
HCT: 38.1 % (ref 35.0–45.0)
Hemoglobin: 12.6 g/dL (ref 11.7–15.5)
Lymphs Abs: 2519 {cells}/uL (ref 850–3900)
MCH: 28.8 pg (ref 27.0–33.0)
MCHC: 33.1 g/dL (ref 32.0–36.0)
MCV: 87.2 fL (ref 80.0–100.0)
MPV: 9.7 fL (ref 7.5–12.5)
Monocytes Relative: 8.9 %
Neutro Abs: 2922 {cells}/uL (ref 1500–7800)
Neutrophils Relative %: 47.9 %
Platelets: 261 Thousand/uL (ref 140–400)
RBC: 4.37 Million/uL (ref 3.80–5.10)
RDW: 14.1 % (ref 11.0–15.0)
Total Lymphocyte: 41.3 %
WBC: 6.1 Thousand/uL (ref 3.8–10.8)

## 2019-02-19 LAB — COMPLETE METABOLIC PANEL WITHOUT GFR
AG Ratio: 1.3 (calc) (ref 1.0–2.5)
ALT: 14 U/L (ref 6–29)
AST: 24 U/L (ref 10–35)
Albumin: 3.8 g/dL (ref 3.6–5.1)
Alkaline phosphatase (APISO): 66 U/L (ref 37–153)
BUN/Creatinine Ratio: 19 (calc) (ref 6–22)
BUN: 18 mg/dL (ref 7–25)
CO2: 31 mmol/L (ref 20–32)
Calcium: 9.6 mg/dL (ref 8.6–10.4)
Chloride: 105 mmol/L (ref 98–110)
Creat: 0.95 mg/dL — ABNORMAL HIGH (ref 0.60–0.88)
GFR, Est African American: 65 mL/min/1.73m2
GFR, Est Non African American: 56 mL/min/1.73m2 — ABNORMAL LOW
Globulin: 3 g/dL (ref 1.9–3.7)
Glucose, Bld: 89 mg/dL (ref 65–99)
Potassium: 4.1 mmol/L (ref 3.5–5.3)
Sodium: 142 mmol/L (ref 135–146)
Total Bilirubin: 0.4 mg/dL (ref 0.2–1.2)
Total Protein: 6.8 g/dL (ref 6.1–8.1)

## 2019-02-19 LAB — HEMOGLOBIN A1C
Hgb A1c MFr Bld: 5.1 % of total Hgb (ref <5.7)
Mean Plasma Glucose: 100 (calc)
eAG (mmol/L): 5.5 (calc)

## 2019-02-19 LAB — LIPID PANEL
Cholesterol: 177 mg/dL
HDL: 47 mg/dL — ABNORMAL LOW
LDL Cholesterol (Calc): 108 mg/dL — ABNORMAL HIGH
Non-HDL Cholesterol (Calc): 130 mg/dL — ABNORMAL HIGH
Total CHOL/HDL Ratio: 3.8 (calc)
Triglycerides: 114 mg/dL

## 2019-02-19 LAB — MAGNESIUM: Magnesium: 1.8 mg/dL (ref 1.5–2.5)

## 2019-02-19 LAB — INSULIN, RANDOM: Insulin: 3.7 u[IU]/mL

## 2019-02-19 LAB — TSH: TSH: 0.96 m[IU]/L (ref 0.40–4.50)

## 2019-02-19 LAB — VITAMIN D 25 HYDROXY (VIT D DEFICIENCY, FRACTURES): Vit D, 25-Hydroxy: 132 ng/mL — ABNORMAL HIGH (ref 30–100)

## 2019-02-20 ENCOUNTER — Other Ambulatory Visit: Payer: Self-pay | Admitting: Adult Health Nurse Practitioner

## 2019-02-20 DIAGNOSIS — F0390 Unspecified dementia without behavioral disturbance: Secondary | ICD-10-CM

## 2019-03-01 ENCOUNTER — Other Ambulatory Visit: Payer: Self-pay | Admitting: Adult Health Nurse Practitioner

## 2019-03-01 DIAGNOSIS — F3289 Other specified depressive episodes: Secondary | ICD-10-CM

## 2019-03-26 ENCOUNTER — Other Ambulatory Visit: Payer: Self-pay | Admitting: Adult Health

## 2019-03-26 DIAGNOSIS — F3289 Other specified depressive episodes: Secondary | ICD-10-CM

## 2019-05-16 ENCOUNTER — Other Ambulatory Visit: Payer: Self-pay | Admitting: Internal Medicine

## 2019-05-16 DIAGNOSIS — F0390 Unspecified dementia without behavioral disturbance: Secondary | ICD-10-CM

## 2019-08-15 ENCOUNTER — Encounter: Payer: Self-pay | Admitting: Adult Health

## 2019-08-15 NOTE — Progress Notes (Signed)
Virtual Visit via Telephone Note  I connected with Kimberly Gregory on 08/18/19 at  2:30 PM EDT by telephone and verified that I am speaking with the correct person using two identifiers.  Location: Patient: home Provider: [facility] office    I discussed the limitations, risks, security and privacy concerns of performing an evaluation and management service by telephone and the availability of in person appointments. I also discussed with the patient that there may be a patient responsible charge related to this service. The patient expressed understanding and agreed to proceed.   History of Present Illness:  There were no vitals taken for this visit.  84 y.o. female new to me, typically sees [place], NP at our office, hx of dementia, smoker; family contacted office to set up appointment due to concerns with progressing dementia and visual hallucinations, was scheduled in office but called to cancel due to patient becoming agitated and combative, absolutely declined to leave home or come to Dr's office. Primarily speaking with Kimberly Gregory today.   She has had progressive memory changes/dementia for many years. Ct head w/o contrast : 10/2016 Atrophy with small vessel chronic ischemic changes of deep cerebral white matter. MRI head w/o C: 11/2015 Mild chronic microvascular changes. Moderate global atrophy without hydrocephalus. She has been on Aircept 5mg  they felt this was initially helpful. Patient apparently has denied having any memory impairment at previous appointments, typically is accompanied by her daughter, . Today I am primarily speaking with the patient's son [facility].   She continues to live in her own home, strong patient and family preference to keep her there. Meds have been minimized due to history of medication mismanagement/overuse leading to admission.    Son/daughter have been assisting with meds/bills and checking on her. She has otherwise apparently been able to  complete ADLs for the most part, still cooks without safety concerns by family. Formerly was smoking, concern with safety smoking in the home, but recently she stopped asking for cigarettes (? Seems to not remember) and have stopped providing for her in the last month.    Family has noted significant decline in her memory in the last 1-2 months, have noted she is more agitated, and reports visual hallucinations and paranoia (seeing things in the mirror). They believe she hasn't been bathing, becomes very agitated and adamantly refuses assistance with bathing. Also seems to be more tired/less energy and sleeping most of the day. Will get up if someone comes over to visit, but quickly becomes fatigued and lays down for a nap.   Daughter [facility] recently quit her job so she can stay with her during the day. Family apparently coordinated for home health service "[name]" to come evaluate, was advised she likely needs memory care admission, she was very suspicious of agent and wouldn't let her in the home on subsequent visits, was agitated and yelling, and service was terminated. Family/patient wish is to remain in home, focus on quality of life, minimal meds (hx of overuse leading to admission).    No weight today but per family she eats well, good appetite, 2-3 ensure daily, drinks minimal water, mostly pepsi but this is ongoing for many years, unchanged. They deny any wandering or other safety concerns. Deny specific health sx, reports of dysuria, new cough, fever, dyspnea, headache, or other complaints.   They wonder if her anxiety meds might be adjusted. She has hx of depression with anxiety, on lexapro 10 mg daily, was on buspar 5 mg BID  but stopped taking due to lack of perceived benefit.   She has not been taking zyrtec, buspar, lovastatin, meloxicam per family.   Current Outpatient Medications on File Prior to Visit  Medication Sig Dispense Refill  . Cholecalciferol (VITAMIN D3) 1.25 MG (50000 UT)  CAPS Take 1 capsule 3  /week for Vitamin D Deficiency 36 capsule 3  . donepezil (ARICEPT) 5 MG tablet TAKE 1 TABLET BY MOUTH AT BEDTIME FOR MEMORY 90 tablet 1  . escitalopram (LEXAPRO) 10 MG tablet Take 1 tablet Daily for Mood 90 tablet 3   No current facility-administered medications on file prior to visit.     Allergies: No Known Allergies Medical History:  has Vertigo; Benign essential HTN; Hyperlipidemia; Dementia with behavioral disturbance ([place]); Laxative abuse; Arthralgia of right hand; Gastroesophageal reflux disease without esophagitis; Former smoker; Vitamin D deficiency; Abnormal glucose; Depression; and Visual hallucinations on their problem list. Surgical History:  She  has a past surgical history that includes Bladder surgery; Abdominal hysterectomy; Cataract extraction; Cholecystectomy; Knee surgery (Right); Eye surgery; and Colonoscopy (2005). Family History:  Herfamily history is not on file. Social History:   reports that she quit smoking about 5 weeks ago. Her smoking use included cigarettes. She has a 27.50 pack-year smoking history. She has never used smokeless tobacco. She reports that she does not drink alcohol or use drugs.  Observations/Objective:  General : Agitated elderly patient in no apparent distress HEENT: no hoarseness, no cough for duration of visit Lungs: speaks in brief sentences, no audible wheezing, no apparent distress Neurological: alert, oriented x 1 Psychiatric: agitated, judgement poor  Assessment and Plan:  Tahiri was seen today for dementia.  Diagnoses and all orders for this visit:  Dementia with behavioral disturbance, unspecified dementia type (HCC) Visual hallucinations Memory changes Cognitive decline Goals of care, counseling/discussion Ideally would like to check labs and r/o metabolic changes, infections, dehydration, but patient refuses to leave home, unlikely would go with EMS if called, very agitated  Family goal is to  care for her in home, minimal meds, focus on quality of life and comfort This may represent progression of her dementia; discussed increased agitation, visual hallucinations, declining hygiene, oral fluid intake, increased sleeping are commonly seen in advancing dementia  Family attempted home care evaluation without benefit, caused agitation I feel this patient and family would most benefit from evaluation by palliative care team and admission to palliative vs hospice as appropriate; would have in home evaluation by NP or other advanced practitioner prepared to work with advanced dementia situation, can assist with safety in home, coordination of resources and care focused on quality of life and goal to stay in home; son is in agreement.  Discussed may benefit from low dose seroquel though risks associated with this med; will defer until can be evaluated by palliative team and see what they suggest after in person evaluation - Amb referral to PALLIATIVE    Follow Up Instructions:  I discussed the assessment and treatment plan with the patient. The patient was provided an opportunity to ask questions and all were answered. The patient agreed with the plan and demonstrated an understanding of the instructions.   The patient was advised to call back or seek an in-person evaluation if the symptoms worsen or if the condition fails to improve as anticipated.  Future Appointments  Date Time Provider [place]  09/16/2019 11:00 AM Liane Comber, NP [facility] None   I provided 26 minutes of non-face-to-face time during this encounter.  Izora Ribas, NP

## 2019-08-18 ENCOUNTER — Ambulatory Visit: Payer: Medicare Other | Admitting: Adult Health

## 2019-08-18 ENCOUNTER — Encounter: Payer: Self-pay | Admitting: Adult Health

## 2019-08-18 ENCOUNTER — Other Ambulatory Visit: Payer: Self-pay

## 2019-08-18 DIAGNOSIS — R413 Other amnesia: Secondary | ICD-10-CM | POA: Diagnosis not present

## 2019-08-18 DIAGNOSIS — Z7189 Other specified counseling: Secondary | ICD-10-CM | POA: Diagnosis not present

## 2019-08-18 DIAGNOSIS — F0391 Unspecified dementia with behavioral disturbance: Secondary | ICD-10-CM

## 2019-08-18 DIAGNOSIS — R4189 Other symptoms and signs involving cognitive functions and awareness: Secondary | ICD-10-CM | POA: Diagnosis not present

## 2019-08-18 DIAGNOSIS — R441 Visual hallucinations: Secondary | ICD-10-CM | POA: Diagnosis not present

## 2019-08-21 ENCOUNTER — Telehealth: Payer: Self-pay | Admitting: Adult Health Nurse Practitioner

## 2019-08-21 NOTE — Telephone Encounter (Signed)
Spoke with patient's daughter [name] and have scheduled a Telephone Consult for 08/27/19 @ 1 PM

## 2019-08-27 ENCOUNTER — Telehealth: Payer: Self-pay | Admitting: Adult Health Nurse Practitioner

## 2019-08-27 ENCOUNTER — Other Ambulatory Visit: Payer: Self-pay

## 2019-08-27 ENCOUNTER — Other Ambulatory Visit: Payer: Medicare Other | Admitting: Adult Health Nurse Practitioner

## 2019-08-27 DIAGNOSIS — Z515 Encounter for palliative care: Secondary | ICD-10-CM | POA: Diagnosis not present

## 2019-08-27 DIAGNOSIS — F0391 Unspecified dementia with behavioral disturbance: Secondary | ICD-10-CM

## 2019-08-27 NOTE — Progress Notes (Signed)
Therapist, nutritional Palliative Care Consult Note Telephone: [phone]  Fax: [phone]  PATIENT NAME: Kimberly Gregory DOB: 12/20/1935 MRN: [id number]  PRIMARY CARE PROVIDER:   Lucky Cowboy, MD  REFERRING PROVIDER: [name], NP  RESPONSIBLE PARTY:   Sherryl Manges, daughter [phone] Tiwana Chavis, son [phone]  Due to the COVID-19 crisis, this visit was done via telemedicine and it was initiated and consent by this patient and or family. Video-audio (telehealth) contact was unable to be done due to technical barriers from the patient's side.    RECOMMENDATIONS and PLAN:  1.  Advanced care planning.  Family states that they are in the process of working on healthcare power of attorney.  Son is currently financial power of attorney.  Family would like to finish with these legalities before deciding on ACP today.  2.  Dementia.  FAST 6a.  Patient is able to ambulate without assistive devices.  Patient had been living alone.  Daughter has recently retired so that she could stay throughout the days with her mother son comes most days and on weekends.  Patient is left alone only at night.  Have tried having an in-home caregiver come into the home to take care of her and she has refused this.  Family states that it has been months since she is taking a shower.  States that she does not have body odor but has noticed her feet are dirty and she looks unclean.  She has been resistant for assistance with bathing.  Family unsure if she has been using the bathroom as she should.  States that she does not eat much but she drinks about 3 ensures a day.  Main problem is with hallucinations, paranoia, and agitation.  Family states that especially when she looks into mirror or reflective glass that she feels like she is seeing somebody that is trying to take her home.  When son went to home yesterday after daughter had left the state house was tore up with close and glass  all over the place and that she had torn up a curling iron.  Main goal right now is to try to keep patient at home for as long as possible.  Family feels as if they can get the behaviors under control that she would be more compliant with keeping clean and not having these outbursts which could keep her at home longer.   Family feels that if they cannot get the behaviors under control that they will have to place her into a facility.  Discussed progression of dementia with family.  Family has done research on their own as well and understands that some of these behaviors are related to the disease progression.  Recommendation is for Haldol 1 mg 3 times daily.  Have reached out to PCP office with this recommendation.   3.  Nutritional status.  Daughter states that she does not eat much but will drink Ensures.  She weighed 145 pounds in December 2020 and daughter states that she had gotten down to 127 pounds but knows she has lost even more weight as her clothes are looser.  Hopefully once the agitation goes down she will be more compliant with eating and other every day activities.     Have telephone appointment on Monday to check for effectiveness of medication and to see if it needs to be titrated.  Also discussed with family possibility of adding or switching to Seroquel if the Haldol is not effective.  I spent 60 minutes providing this consultation,  From 1:00 to 2:00 including time spent with patient/family, chart review, provider coordination, provider coordination, documentation. More than 50% of the time in this consultation was spent coordinating communication.   HISTORY OF PRESENT ILLNESS:  Kimberly Gregory is a 84 y.o. year old female with multiple medical problems including dementia with behaviors, GERD, HTN, HLD. Palliative Care was asked to help address goals of care.   CODE STATUS: see above  PPS: 50% HOSPICE ELIGIBILITY/DIAGNOSIS: TBD  PHYSICAL EXAM:   Deferred  PAST MEDICAL  HISTORY:  Past Medical History:  Diagnosis Date  . Acute encephalopathy 06/13/2016  . AKI (acute kidney injury) ([place]) 06/13/2016  . Arthritis   . Chronic kidney disease   . Dementia ([place])   . Depression 09/11/2018  . Gastroesophageal reflux disease without esophagitis 09/11/2018  . Headache   . Hypercholesterolemia   . Hypertension   . Ischemic colitis ([place]) 10/24/2016  . SIRS (systemic inflammatory response syndrome) ([place]) 10/21/2016  . Vertigo 10/14/2014    SOCIAL HX:  Social History   Tobacco Use  . Smoking status: Former Smoker    Packs/day: 0.50    Years: 55.00    Pack years: 27.50    Types: Cigarettes    Quit date: 07/12/2019    Years since quitting: 0.1  . Smokeless tobacco: Never Used  . Tobacco comment: Stopped smoking May 2021 per family  Substance Use Topics  . Alcohol use: No    ALLERGIES: No Known Allergies   PERTINENT MEDICATIONS:  Outpatient Encounter Medications as of 08/27/2019  Medication Sig  . Cholecalciferol (VITAMIN D3) 1.25 MG (50000 UT) CAPS Take 1 capsule 3  /week for Vitamin D Deficiency  . donepezil (ARICEPT) 5 MG tablet TAKE 1 TABLET BY MOUTH AT BEDTIME FOR MEMORY  . escitalopram (LEXAPRO) 10 MG tablet Take 1 tablet Daily for Mood   No facility-administered encounter medications on file as of 08/27/2019.      Carmon Brigandi Jenetta Downer, NP

## 2019-08-27 NOTE — Telephone Encounter (Signed)
Returned daughter's VM. States that her mother is having episodes of combativeness, especially with her son.  This afternoon has locked herself in her room.  States that these episodes are almost daily.  Have sent fax to PCP with recommendation for haldol per office staff request.  Have not heard anything back from PCP.  Will attempt to call again if have received request. Sabriel Borromeo K. Garner Nash NP

## 2019-08-28 ENCOUNTER — Other Ambulatory Visit: Payer: Self-pay | Admitting: Adult Health Nurse Practitioner

## 2019-08-28 ENCOUNTER — Encounter: Payer: Self-pay | Admitting: Adult Health Nurse Practitioner

## 2019-08-28 ENCOUNTER — Telehealth: Payer: Self-pay | Admitting: Adult Health Nurse Practitioner

## 2019-08-28 DIAGNOSIS — F29 Unspecified psychosis not due to a substance or known physiological condition: Secondary | ICD-10-CM

## 2019-08-28 MED ORDER — HALOPERIDOL 1 MG PO TABS: 1.0000 mg | ORAL_TABLET | Freq: Three times a day (TID) | ORAL | 2 refills | Status: DC

## 2019-08-28 NOTE — Telephone Encounter (Signed)
Called Dr. Kathryne Sharper to see if they received fax from our office with recommendation for Haldol 1 mg TID for paranoia, hallucinations, agitation.  They had not received the fax.  Had our office resend it.  Santresa Levett K. Garner Nash NP

## 2019-08-28 NOTE — Progress Notes (Signed)
Received correspondence from [facility], Palliative care.  She was seen by Angelique Holm, AGNP-C, DNP.  She recommended haldol 1mg  TID.  She reports the patient is having hallucinations, paranoia, agitation.  They also discussed adding seroquel if this is not effective.   , [name], DNP [facility] 08/28/2019  1:48 PM

## 2019-09-01 ENCOUNTER — Other Ambulatory Visit: Payer: Self-pay

## 2019-09-01 ENCOUNTER — Telehealth: Payer: Self-pay | Admitting: Adult Health Nurse Practitioner

## 2019-09-01 ENCOUNTER — Other Ambulatory Visit: Payer: Medicare Other | Admitting: Adult Health Nurse Practitioner

## 2019-09-01 DIAGNOSIS — Z515 Encounter for palliative care: Secondary | ICD-10-CM | POA: Diagnosis not present

## 2019-09-01 DIAGNOSIS — F0391 Unspecified dementia with behavioral disturbance: Secondary | ICD-10-CM

## 2019-09-01 NOTE — Progress Notes (Signed)
Therapist, nutritional Palliative Care Consult Note Telephone: [phone]  Fax: [phone]  PATIENT NAME: Kimberly Gregory DOB: [birth year] MRN: [id number]  PRIMARY CARE PROVIDER:   Lucky Cowboy, MD  REFERRING PROVIDER:  [name], NP  RESPONSIBLE PARTY:   Kimberly Gregory, daughter [phone] Kimberly Gregory, son [phone]  Due to the COVID-19 crisis, this visit was done via telemedicine and it was initiated and consent by this patient and or family. Video-audio (telehealth) contact was unable to be done due to technical barriers from the patient's side.   RECOMMENDATIONS and PLAN:  1.  Advanced care planning.  Patient full code.  Still needs further ACP discussion  2.  Dementia.  FAST 6a.  Patient started on Haldol 1 mg TID last week.  Daughter states that the haldol seems to be helping.  States that her mother did well over the weekend but did have one day on Saturday when she was more agitated but today is doing well.  Does state that she has to put the haldol crushed in her Ensure.  States that her mother mostly eats peanut butter and jelly sandwiches and her Ensure and asked if that was okay.  Discussed that it is better that she is getting some nutrition as opposed to not eating at all and she does supplement with Ensure.  Continue to monitor for effectiveness and any adjustments.  Have scheduled next appointment in 4 weeks.  I spent 20 minutes providing this consultation,  from 1:00 to 1:20 including time spent with patient/family, chart review, provider coordination, documentation. More than 50% of the time in this consultation was spent coordinating communication.   HISTORY OF PRESENT ILLNESS:  Kimberly Gregory is a 84 y.o. year old female with multiple medical problems including dementia with behaviors, GERD, HTN, HLD. Palliative Care was asked to help address goals of care.   CODE STATUS: see above  PPS: 50% HOSPICE ELIGIBILITY/DIAGNOSIS:  TBD  PHYSICAL EXAM:   Deferred   PAST MEDICAL HISTORY:  Past Medical History:  Diagnosis Date  . Acute encephalopathy 06/13/2016  . AKI (acute kidney injury) ([facility]) 06/13/2016  . Arthritis   . Chronic kidney disease   . Dementia ([facility])   . Depression 09/11/2018  . Gastroesophageal reflux disease without esophagitis 09/11/2018  . Headache   . Hypercholesterolemia   . Hypertension   . Ischemic colitis ([facility]) 10/24/2016  . SIRS (systemic inflammatory response syndrome) ([facility]) 10/21/2016  . Vertigo 10/14/2014    SOCIAL HX:  Social History   Tobacco Use  . Smoking status: Former Smoker    Packs/day: 0.50    Years: 55.00    Pack years: 27.50    Types: Cigarettes    Quit date: 07/12/2019    Years since quitting: 0.1  . Smokeless tobacco: Never Used  . Tobacco comment: Stopped smoking May 2021 per family  Substance Use Topics  . Alcohol use: No    ALLERGIES: No Known Allergies   PERTINENT MEDICATIONS:  Outpatient Encounter Medications as of 09/01/2019  Medication Sig  . Cholecalciferol (VITAMIN D3) 1.25 MG (50000 UT) CAPS Take 1 capsule 3  /week for Vitamin D Deficiency  . donepezil (ARICEPT) 5 MG tablet TAKE 1 TABLET BY MOUTH AT BEDTIME FOR MEMORY  . escitalopram (LEXAPRO) 10 MG tablet Take 1 tablet Daily for Mood  . haloperidol (HALDOL) 1 MG tablet Take 1 tablet (1 mg total) by mouth 3 (three) times daily.   No facility-administered encounter medications on file as of 09/01/2019.  Kimberly Gregory Jenetta Downer, NP

## 2019-09-01 NOTE — Telephone Encounter (Signed)
Was calling for scheduled phone visit to go over effectiveness of medication changes.   Left VM with reason for call and contact info.   Faithlyn Recktenwald K. Garner Nash NP

## 2019-09-12 ENCOUNTER — Encounter: Payer: Self-pay | Admitting: Adult Health

## 2019-09-12 NOTE — Progress Notes (Deleted)
MEDICARE ANNUAL WELLNESS VISIT AND FOLLOW UP  Assessment:   Diagnoses and all orders for this visit:  Encounter for Medicare annual wellness exam  Benign essential HTN  Gastroesophageal reflux disease without esophagitis  Dementia with behavioral disturbance, unspecified dementia type (HCC)  Vitamin D deficiency  Visual hallucinations  Vertigo  Mixed hyperlipidemia  Former smoker  Other depression      Over 40 minutes of exam, counseling, chart review and critical decision making was performed Future Appointments  Date Time Provider Department Center  09/16/2019 11:00 AM [name], NP [facility] None  09/29/2019  3:00 PM Gusler, Christin Z, NP [facility] None     Plan:   During the course of the visit the patient was educated and counseled about appropriate screening and preventive services including:    Pneumococcal vaccine   Prevnar 13  Influenza vaccine  Td vaccine  Screening electrocardiogram  Bone densitometry screening  Colorectal cancer screening  Diabetes screening  Glaucoma screening  Nutrition counseling   Advanced directives: requested   Subjective:  Kimberly Gregory is a 84 y.o. female who presents for Medicare Annual Wellness Visit and 3 month follow up.   he has had progressive memory changes/dementia for many years. Ct head w/o contrast : 10/2016 Atrophy with small vessel chronic ischemic changes of deep cerebral white matter. MRI head w/o C: 11/2015 Mild chronic microvascular changes. Moderate global atrophy without hydrocephalus. She has been on Aircept 5mg  they felt this was initially helpful. Patient apparently has denied having any memory impairment at previous appointments, typically is accompanied by her daughter, . On lexapro 10 mg daily for anxiety. Buspar wasn't helpful.   Was referred to palliative vs hospice due to progressive sx, agitation and hallucination, patient wouldn't leave home; Leanord Asal, NP  recommended haldol 1 mg TID which was initiated with improvement, ***   Former smoker, recently stopped smoking ***  GERD, vertigo   BMI is There is no height or weight on file to calculate BMI., she {HAS HAS Angelique Holm been working on diet and exercise. Wt Readings from Last 3 Encounters:  02/18/19 145 lb (65.8 kg)  10/22/18 143 lb 6.4 oz (65 kg)  10/01/18 147 lb 12.8 oz (67 kg)   She has had elevated blood pressure for *** years. Her blood pressure {HAS HAS NOT:18834} been controlled at home, today their BP is   She {DOES_DOES 10/03/18 workout. She denies chest pain, shortness of breath, dizziness.   She {ACTION; IS/IS OVZ:85885} on cholesterol medication and denies myalgias. Her cholesterol {ACTION; IS/IS NOT:21021397} at goal. The cholesterol last visit was:   Lab Results  Component Value Date   CHOL 177 02/18/2019   HDL 47 (L) 02/18/2019   LDLCALC 108 (H) 02/18/2019   TRIG 114 02/18/2019   CHOLHDL 3.8 02/18/2019   Last 14/10/2018 in the office was:  Lab Results  Component Value Date   HGBA1C 5.1 02/18/2019   Last GFR: Lab Results  Component Value Date   GFRNONAA 56 (L) 02/18/2019   Patient is on Vitamin D supplement. ***  Lab Results  Component Value Date   VD25OH 132 (H) 02/18/2019      Medication Review: Current Outpatient Medications on File Prior to Visit  Medication Sig Dispense Refill  . Cholecalciferol (VITAMIN D3) 1.25 MG (50000 UT) CAPS Take 1 capsule 3  /week for Vitamin D Deficiency 36 capsule 3  . donepezil (ARICEPT) 5 MG tablet TAKE 1 TABLET BY MOUTH AT BEDTIME FOR MEMORY 90 tablet 1  .  escitalopram (LEXAPRO) 10 MG tablet Take 1 tablet Daily for Mood 90 tablet 3  . haloperidol (HALDOL) 1 MG tablet Take 1 tablet (1 mg total) by mouth 3 (three) times daily. 90 tablet 2   No current facility-administered medications on file prior to visit.    No Known Allergies  Current Problems (verified) Patient Active Problem List   Diagnosis Date Noted  .  Visual hallucinations 08/18/2019  . Gastroesophageal reflux disease without esophagitis 09/11/2018  . Former smoker 09/11/2018  . Vitamin D deficiency 09/11/2018  . Depression 09/11/2018  . Arthralgia of right hand 08/19/2018  . Dementia with behavioral disturbance (HCC) 10/21/2016  . Vertigo 10/14/2014  . Benign essential HTN 10/14/2014  . Hyperlipidemia 10/14/2014    Screening Tests Immunization History  Administered Date(s) Administered  . Pneumococcal Conjugate-13 03/13/2002  . Pneumococcal Polysaccharide-23 03/14/2003  . Zoster 03/13/2013    Tetanus: 2017 Pneumovax:2005 Prevnar 13: 2004 Flu vaccine: due for 2020 Zostavax: 2015 Covid 19: ***  Preventative care: Last colonoscopy: 2005, DONE due to age and dementia Last mammogram: 2016 *** Last pap smear/pelvic exam: remote, DONE DEXA: ***  Names of Other Physician/Practitioners you currently use: 1. [facility] here for primary care 2. ***, eye doctor, last visit *** 3. ***, dentist, last visit ***  Patient Care Team: Lucky Cowboy, MD as PCP - General (Internal Medicine)  SURGICAL HISTORY She  has a past surgical history that includes Bladder surgery; Abdominal hysterectomy; Cataract extraction; Cholecystectomy; Knee surgery (Right); Eye surgery; and Colonoscopy (2005). FAMILY HISTORY Her family history is not on file. SOCIAL HISTORY She  reports that she quit smoking about 2 months ago. Her smoking use included cigarettes. She has a 27.50 pack-year smoking history. She has never used smokeless tobacco. She reports that she does not drink alcohol and does not use drugs.   MEDICARE WELLNESS OBJECTIVES: Physical activity:   Cardiac risk factors:   Depression/mood screen:   Depression screen [facility] 2/9 09/10/2018  Decreased Interest 1  Down, Depressed, Hopeless 2  PHQ - 2 Score 3  Altered sleeping 0  Tired, decreased energy 0  Change in appetite 1  Feeling bad or  failure about yourself  0  Trouble concentrating 0  Moving slowly or fidgety/restless 0  Suicidal thoughts 0  PHQ-9 Score 4  Difficult doing work/chores Not difficult at all    ADLs:  No flowsheet data found.   Cognitive Testing  Alert? Yes  Normal Appearance?Yes  Oriented to person? Yes  Place? Yes   Time? Yes  Recall of three objects?  Yes  Can perform simple calculations? Yes  Displays appropriate judgment?Yes  Can read the correct time from a watch face?Yes  EOL planning:    *** Review of Systems  Constitutional: Negative for malaise/fatigue and weight loss.  HENT: Negative for hearing loss and tinnitus.   Eyes: Negative for blurred vision and double vision.  Respiratory: Negative for cough, sputum production, shortness of breath and wheezing.   Cardiovascular: Negative for chest pain, palpitations, orthopnea, claudication, leg swelling and PND.  Gastrointestinal: Negative for abdominal pain, blood in stool, constipation, diarrhea, heartburn, melena, nausea and vomiting.  Genitourinary: Negative.   Musculoskeletal: Negative for falls, joint pain and myalgias.  Skin: Negative for rash.  Neurological: Negative for dizziness, tingling, sensory change, weakness and headaches.  Endo/Heme/Allergies: Negative for polydipsia.  Psychiatric/Behavioral: Positive for hallucinations and memory loss. Negative for depression, substance abuse and suicidal ideas. The patient is not nervous/anxious and does not have  insomnia.   All other systems reviewed and are negative.    Objective:     There were no vitals filed for this visit. There is no height or weight on file to calculate BMI.  General appearance: alert, no distress, WD/WN, female HEENT: normocephalic, sclerae anicteric, TMs pearly, nares patent, no discharge or erythema, pharynx normal Oral cavity: MMM, no lesions Neck: supple, no lymphadenopathy, no thyromegaly, no masses Heart: RRR, normal S1, S2, no murmurs Lungs: CTA  bilaterally, no wheezes, rhonchi, or rales Abdomen: +bs, soft, non tender, non distended, no masses, no hepatomegaly, no splenomegaly Musculoskeletal: nontender, no swelling, no obvious deformity Extremities: no edema, no cyanosis, no clubbing Pulses: 2+ symmetric, upper and lower extremities, normal cap refill Neurological: alert, oriented x 3, CN2-12 intact, strength normal upper extremities and lower extremities, sensation normal throughout, DTRs 2+ throughout, no cerebellar signs, gait normal Psychiatric: normal affect, behavior normal, pleasant   Medicare Attestation I have personally reviewed: The patient's medical and social history Their use of alcohol, tobacco or illicit drugs Their current medications and supplements The patient's functional ability including ADLs,fall risks, home safety risks, cognitive, and hearing and visual impairment Diet and physical activities Evidence for depression or mood disorders  The patient's weight, height, BMI, and visual acuity have been recorded in the chart.  I have made referrals, counseling, and provided education to the patient based on review of the above and I have provided the patient with a written personalized care plan for preventive services.     Dan Maker, NP   09/12/2019

## 2019-09-15 ENCOUNTER — Telehealth: Payer: Self-pay

## 2019-09-15 NOTE — Telephone Encounter (Signed)
At the request of Amy NP, phone call placed to patient's daughter. Angie.Angie shared that patient has displayed an increase in her behaviors over the past week. Behaviors include yelling at a mirror and destroying the house.  Patient currently is on Haldol 1mg  TID. This med was started mid June and was initially effective. Daughter denied noting any symptoms of UTI or evidence of pain. Amy NP updated and asked that PCP be notified of behavioral changes.

## 2019-09-16 ENCOUNTER — Telehealth: Payer: Self-pay

## 2019-09-16 ENCOUNTER — Other Ambulatory Visit: Payer: Self-pay | Admitting: Adult Health

## 2019-09-16 ENCOUNTER — Encounter: Payer: Medicare Other | Admitting: Adult Health

## 2019-09-16 DIAGNOSIS — F29 Unspecified psychosis not due to a substance or known physiological condition: Secondary | ICD-10-CM

## 2019-09-16 MED ORDER — HALOPERIDOL 2 MG PO TABS: 2.0000 mg | ORAL_TABLET | Freq: Three times a day (TID) | ORAL | 2 refills | Status: DC

## 2019-09-16 NOTE — Telephone Encounter (Signed)
Left messages for both son and daughter to call me back in regard to patient's Haldol

## 2019-09-16 NOTE — Progress Notes (Signed)
Patient with advancing dementia, agitation and psychosis was evaluated in home by Palliative care, Angelique Holm, NP, and was initiated on low dose 1 mg TID haldol with initial improvement. However family reported again with increased agitation recently, palliative recommending trial of increased dose of haldol. I reviewed and am in agreement with this. Will send in 2 mg TID to try and schedule close follow up here in office in a few weeks.

## 2019-09-16 NOTE — Telephone Encounter (Signed)
-----   Message from [name], NP sent at 09/16/2019 11:12 AM EDT ----- Regarding: FW: Medication question Please advise I do think that going up on Haldol would be ok if initially helpful, no concerning negative effects when she tried the 1 mg TID dose. Let's try 2 mg TID - sent in to [facility] pharmacy on file (can try doubling up on 2 tabs of 1 mg if still has some of this left).  Missed AWV today - please schedule within the next few weeks so we can follow up on how she's doing with haldol increase.   ----- Message ----- From: Lucky Cowboy, MD Sent: 09/15/2019   2:47 PM EDT To: [name], NP Subject: FW: Medication question                         ----- Message ----- From: Estanislado Pandy, RN Sent: 09/15/2019  12:43 PM EDT To: Lucky Cowboy, MD Subject: Medication question                            Good Afternoon,     I spoke with Mrs. Urbas daughter who reported that patient has an increase in patient's behaviors over the past week. She was started on Haldol 1mg  three times a day around 08/28/19. The daughter did say when patient first started medication it was effective. I spoke with 08/30/19, NP, who asked that I reach out to you and see if you thought it was appropriate to increase the Haldol.   Thank you for your time!                                                                                Angelique Holm, RN

## 2019-09-17 ENCOUNTER — Other Ambulatory Visit: Payer: Self-pay | Admitting: Adult Health Nurse Practitioner

## 2019-09-17 DIAGNOSIS — R300 Dysuria: Secondary | ICD-10-CM

## 2019-09-17 NOTE — Progress Notes (Signed)
09/17/19 Received message from Pallative care RN regarding abnormal behaviors for patient, ie yelling at those doing her yard work, yelling at self in mirror and some agitation.  Recent increase in Haldol to 2mg .  Was scheduled for AWV and did not show.  Attempts to contact patient/family noted in chart.  Writer called daughter regarding appointment and bringing in urine sample to rule out infection.  Previous discussion of adding Seroquel to daily regiment with Palliative care recommendations and home observations.  Awaiting response/family contact.  , [name], DNP [facility] 09/17/2019  12:54 PM

## 2019-09-24 ENCOUNTER — Telehealth: Payer: Self-pay | Admitting: Adult Health Nurse Practitioner

## 2019-09-24 NOTE — Telephone Encounter (Signed)
Returned daughter's call.  Patient is still having days in which she is really agitated and at times will throw things.  Has yelled at people including family when they come to her home.  Daughter does state that she does have good days in which she does not seem agitated and has noticed that she is eating better and has gained weight. Daughter states that she felt better after talking with someone about this and wants to wait until her mother is seen by palliative provider on 09/29/19 at 3pm.  Have reached out to [name] NP with this update. Jakyiah Briones K. Garner Nash NP

## 2019-09-29 ENCOUNTER — Other Ambulatory Visit: Payer: Medicare Other | Admitting: Nurse Practitioner

## 2019-09-29 ENCOUNTER — Other Ambulatory Visit: Payer: Self-pay

## 2019-09-29 ENCOUNTER — Encounter: Payer: Self-pay | Admitting: Nurse Practitioner

## 2019-09-29 DIAGNOSIS — Z515 Encounter for palliative care: Secondary | ICD-10-CM | POA: Diagnosis not present

## 2019-09-29 DIAGNOSIS — F0391 Unspecified dementia with behavioral disturbance: Secondary | ICD-10-CM

## 2019-09-29 NOTE — Progress Notes (Signed)
Therapist, nutritional Palliative Care Consult Note Telephone: [phone]  Fax: [phone]  PATIENT NAME: Kimberly Gregory DOB: 03-12-1936 MRN: [id number]  PRIMARY CARE PROVIDER:   Lucky Cowboy, MD  REFERRING PROVIDER:  Lucky Cowboy, MD [address] [place],  [place] [address]  RECOMMENDATIONS and PLAN:  1.ACP; Full code, will need to revisit at next [facility] visit and further goc  2. Dementia with behaviors; would recommend to d/c haldol; start seroquel 25mg  bid if has supervision to monitor behaviors or inpatient psychiatry admission. Would benefit from primary visit including labs; will call primary to further discuss palliative care visit, concerns.   3. Palliative care encounter; Palliative medicine team will continue to support patient, patient's family, and medical team. Visit consisted of counseling and education dealing with the complex and emotionally intense issues of symptom management and palliative care in the setting of serious and potentially life-threatening illness  I spent 120 minutes providing this consultation,  from 3:00pm to 5:00pm. More than 50% of the time in this consultation was spent coordinating communication.   HISTORY OF PRESENT ILLNESS:  Kimberly Gregory is a 84 y.o. year old female with multiple medical problems including dementia with behaviors, GERD, HTN, HLD.Face-to-face follow-up palliative care visit with Ms. Kimberly Gregory, Kimberly Gregory, daughter Kimberly Gregory and I. On arrival to the home Kimberly Gregory and Kimberly Gregory were in the driveway, talked about this folk prior to entering the home. We talked about dementia, the last time we spoke was independent at home about 3 years ago. Ms. Kimberly Gregory has not driven in about a year-and-a-half. Ms. Kimberly Gregory is progressively declined. Kimberly Gregory endorses that she declined to take a bath. Ms. Kimberly Gregory is ambulatory with no recent falls. Kimberly Gregory endorses that there are times when Ms. Kimberly Gregory becomes violent, throw things, "runs  people out of her home". We talked about her appetite. Kimberly Gregory endorses that it has improved a little bit she will drink ensure for which they are putting her Haldol in. We talked about support system as she has family in-home surrounding her although separate dwellings. We talked about family visiting this salt multiple times a day. Ms. Kimberly Gregory does stay independently by herself. We talked about family bringing food. Kimberly Gregory endorses this fog is able to make a peanut butter and jelly sandwich. Ms. Kimberly Gregory has not attempted to cook in over a couple months. Ms. Kimberly Gregory was smoking cigarettes and losing weight but they stopped bringing the cigarettes couple months ago and she has not asked for them. Kimberly Gregory endorses that she does visit daily. Kimberly Gregory endorses but at times Ms. [name] does become upset and angry at Kimberly Gregory. We talked about concern for hallucinations. [place] [facility] endorses that the picture of her father, Mr. Kimberly Gregory. The picture disappeared and Ms. Kimberly Gregory has not been able to locate it. Kimberly Gregory endorses she had been sleeping with the picture. We talked about way to initiate visit with Ms. Kimberly Gregory.  Kimberly Gregory knocked on the door and opened it with her key. The home was dark in the shades all drawn. Ms. [name] was smiling and very welcoming. I introduced myself as palliative nurse practitioner to come to check on Ms. Kimberly Gregory. Ms. Kimberly Gregory ask me to sit down in her rocker. We talked about how she was feeling today. Ms. Kimberly Gregory endorses that she is doing great that she is not sick. Ms. Kimberly Gregory endorses she does not need to see a doctor she is not sick. Ms Kimberly Gregory talked repetitively, ruminating about the pictures that are on her couch  and in her home. Ms Kimberly Gregory picked up the same pictures about every five to ten minutes and reintroduce the pictures. Ms Kimberly Gregory had bird feeders and talked about the bird feeder. Asked Ms. Kimberly Gregory if she was hungry and she declined. Ms Kimberly Gregory went to pull her pants up as it appears she has lost some weight. Ms. Kimberly Gregory repetitively talked  about her husband and Son Kimberly Gregory pointing at his picture. Ms Kimberly Gregory also asked if I came to her home with Kimberly Gregory her daughter or a different car. Pointed out asked if I came to her home with Kimberly Gregory her daughter or a different car. Pointed out the car in the driveway. Ms. [name] repetitively asked the same questions. I asked Ms. Kimberly Gregory if she watch TV. Ms. Kimberly Gregory did become irritable and say that she can watch whatever she wants to watch. Ms. Kimberly Gregory did calm down and ruminated over the pictures and picked up record albums on the couch. Ms. Kimberly Gregory talked a little bit about Elvis which is a singer who she likes. Kimberly Gregory mention that she is a [place]. Ms. Kimberly Gregory talked about her heritage briefly. Ms. Kimberly Gregory talked more about playing basketball as a young girl. Ms. Kimberly Gregory did allow assessment. Limited discussion due to cognitive impairment. Ms. Kimberly Gregory was smiling upon completing visit. Kimberly Gregory stayed with Ms. Kimberly Gregory. Kimberly Gregory and I proceeded outside meeting separately. We talked about concerned for decline and safety. We talked about concern for Ms. Kimberly Gregory staying by herself. We talked about concerns for the delusions and hallucinations creating suffering. We talked about the option of inpatient psychiatric for medication management. Kimberly Gregory endorses they are in the process of working with an attorney to get health care power of attorney. We talked about concern of worsening dementia. We talked about behaviors not allowing people in the home and running people out of the home, throwing things at people, isolation. We talked about medications. Discuss concern for medications and safety, not putting the house all in the insurer as not 100% she gets it and also that if she drinks multiple ensures at the same time. We talked about the option of Seroquel although concern For close monitoring and behaviors. Kimberly Gregory endorses that he will contact his attorney in the morning as it is late this afternoon in addition to further discussion with his sisters for  next step. We talked about the possibility of Kimberly Gregory staying overnight with Ms [facility]. Kimberly Gregory talked about when family does come to see typically she is in bed. We talked about confirm what is she doing in the middle of the night and some families install cameras though with Ms. Baugh paranoia could make things worse. No one endorses that we will touch base tomorrow with further plans. We did talk about involuntary commitment as it will be very difficult to get her to leave her home and what that scenario would look like. We talked about locked memory care unit. We talked about Hospice Services Under Medicare benefit although at this point Ms. Vignola does not appear to be eligible for Hospice the biggest concern is behavior and safety. Kimberly Gregory verbalized he was thankful for palliative care visit. Questions answered to satisfaction. Contact information provided. Therapeutic listening and emotional support provided.  Palliative Care was asked to continue to help address goals of care.   CODE STATUS: full code  PPS: 50% HOSPICE ELIGIBILITY/DIAGNOSIS: TBD  PAST MEDICAL HISTORY:  Past Medical History:  Diagnosis Date  . Acute encephalopathy 06/13/2016  . AKI (acute kidney injury) (HCC)  06/13/2016  . Arthritis   . Chronic kidney disease   . Dementia (HCC)   . Depression 09/11/2018  . Gastroesophageal reflux disease without esophagitis 09/11/2018  . Headache   . Hypercholesterolemia   . Hypertension   . Ischemic colitis (HCC) 10/24/2016  . Laxative abuse 10/21/2016  . SIRS (systemic inflammatory response syndrome) (HCC) 10/21/2016  . Vertigo 10/14/2014    SOCIAL HX:  Social History   Tobacco Use  . Smoking status: Former Smoker    Packs/day: 0.50    Years: 55.00    Pack years: 27.50    Types: Cigarettes    Quit date: 07/12/2019    Years since quitting: 0.2  . Smokeless tobacco: Never Used  . Tobacco comment: Stopped smoking May 2021 per family  Substance Use Topics  . Alcohol use: No    ALLERGIES: No  Known Allergies   PERTINENT MEDICATIONS:  Outpatient Encounter Medications as of 09/29/2019  Medication Sig  . Cholecalciferol (VITAMIN D3) 1.25 MG (50000 UT) CAPS Take 1 capsule 3  /week for Vitamin D Deficiency  . donepezil (ARICEPT) 5 MG tablet TAKE 1 TABLET BY MOUTH AT BEDTIME FOR MEMORY  . escitalopram (LEXAPRO) 10 MG tablet Take 1 tablet Daily for Mood  . haloperidol (HALDOL) 2 MG tablet Take 1 tablet (2 mg total) by mouth 3 (three) times daily. For agitation.   No facility-administered encounter medications on file as of 09/29/2019.    PHYSICAL EXAM:   General: NAD, thin, oriented to self, place, difficulty with events, repetitive with speech Cardiovascular: regular rate and rhythm Pulmonary: clear ant fields Neurological: ambulatory  Breeana Sawtelle Prince [place], NP

## 2019-09-30 ENCOUNTER — Telehealth: Payer: Self-pay | Admitting: Nurse Practitioner

## 2019-09-30 NOTE — Telephone Encounter (Signed)
   I called [name] NP. [name] NP returned my call. Update given on palliative care visit. Plan is for [name] NP to contact Ms [facility] son or daughter to schedule in person primary care provider visit with labs with possible goal for impatience psychiatric treatment or if family is able to stay 24 hours a day with Ms Murtaugh possibly trying Seroquel. Nurse practitioner will update once able to get in touch with family to see if scheduling a visit is is doable, as it is very difficult to get Ms. Hendrix to leave her home.

## 2019-10-09 ENCOUNTER — Telehealth: Payer: Self-pay | Admitting: Nurse Practitioner

## 2019-10-09 NOTE — Telephone Encounter (Signed)
I called Ms. Fulks daughter [name] to see about palliative f/u visit to see if any decisions were made, primary provider was going to contact for in person visit to include labs. I also called Mr. Filippone, Ms. Scheiber son, message left also seeing if wishes to schedule f/u PC visit as well as if any decisions have been decided upon or plan of care

## 2019-10-13 ENCOUNTER — Telehealth: Payer: Self-pay | Admitting: Adult Health Nurse Practitioner

## 2019-10-13 NOTE — Telephone Encounter (Signed)
returned your call. Please call [phone]

## 2019-11-04 ENCOUNTER — Other Ambulatory Visit: Payer: Self-pay | Admitting: Adult Health Nurse Practitioner

## 2019-11-04 DIAGNOSIS — F0390 Unspecified dementia without behavioral disturbance: Secondary | ICD-10-CM

## 2019-11-18 NOTE — Progress Notes (Signed)
MEDICARE ANNUAL WELLNESS VISIT AND FOLLOW UP  Assessment:   Para Kimberly Gregory was seen today for follow-up and medicare wellness.  Diagnoses and all orders for this visit:  Encounter for Medicare annual wellness exam Due annually, DEXA ordered to schedule  Benign essential HTN Controlled off of meds Monitor blood pressure at home; call if consistently over 130/80 Continue DASH diet.   Reminder to go to the ER if any CP, SOB, nausea, dizziness, severe HA, changes vision/speech, left arm numbness and tingling and jaw pain. -     CBC with Differential/Platelet -     COMPLETE METABOLIC PANEL WITH GFR -     Magnesium  Gastroesophageal reflux disease without esophagitis Denies sx off of meds; monitor -     COMPLETE METABOLIC PANEL WITH GFR -     Magnesium  Dementia with behavioral disturbance, unspecified dementia type (HCC) Stopped aricept and lexapro due to lack of perceived benefit MMSE 20 > 15 since last check  Agitation much improved with haldol but per palliative recommendations transitioning to Seroquel; patient family declined admission for management that was recommended Discussed with Dr. Oneta RackMckeown, aware and in agreement with below plan Down to 1 mg haldol PRN daily - STOP Start Seroquel 25 mg in evening, then increase to 2 tabs if needed - risks with med discussed, increased risk of mortality, family wishes to proceed for quality of life. Goal to keep her independent and in home Follow up telephone visit in 2 weeks or sooner if needed -     CBC with Differential/Platelet -     COMPLETE METABOLIC PANEL WITH GFR -     TSH -     Urinalysis w microscopic + reflex cultur -     Vitamin B12  Vitamin D deficiency -     VITAMIN D 25 Hydroxy (Vit-D Deficiency, Fractures)  Visual hallucinations Improved with haldol; patient denies Transitioning to seroquel per palliative team recommendations  Vertigo Denies current sx; monitor  Mixed hyperlipidemia Discussed with patient and  family; would not treat if elevated Defer checking today per shared medical decision Continue low cholesterol diet and exercise.   Former smoker Sport and exercise psychologistMonitor;   Other depression Patient denies sx; in remission off of meds at this time Family monitoring closely; follow up if increased crying, not participating in regular activities  B12 deficiency -     Vitamin B12  Other abnormal glucose -     Hemoglobin A1c  Estrogen deficiency -     DG Bone Density; Future   Over 40 minutes of exam, counseling, chart review and critical decision making was performed Future Appointments  Date Time Provider Department Center  12/04/2019  4:00 PM Judd Gaudierorbett, Yuritzi Kamp, NP [facility] None  05/27/2020 10:00 AM Judd Gaudierorbett, Carr Shartzer, NP [facility] None  11/29/2020  2:00 PM Judd Gaudierorbett, Meeka Cartelli, NP [facility] None     Plan:   During the course of the visit the patient was educated and counseled about appropriate screening and preventive services including:    Pneumococcal vaccine   Prevnar 13  Influenza vaccine  Td vaccine  Screening electrocardiogram  Bone densitometry screening  Colorectal cancer screening  Diabetes screening  Glaucoma screening  Nutrition counseling   Advanced directives: requested   Subjective:  Kimberly Gregory B Bouza is a 84 y.o. female who presents for Medicare Annual Wellness Visit and overdue follow up for chronic conditions.   Patient typically is accompanied by her son Kimberly Gregory. daughter, Kimberly Gregory also monitors closely, supervises during the day. Family drives, manages meds and  bills, otherwise patient is fairly independent in ADLs and 50% of housework.   She has had progressive memory changes, no formal workup but presumed vascular, with decline and behaviors this year. Ct head w/o contrast : 10/2016 Atrophy with small vessel chronic ischemic changes of deep cerebral white matter. MRI head w/o C: 11/2015 Mild chronic microvascular changes. Moderate global atrophy without  hydrocephalus. She was on Aircept 5mg  they felt this was initially helpful but stopped this year. She continues to live in her own home, strong patient and family preference to keep her there. Meds have been minimized due to history of medication mismanagement/overuse leading to admission.   She had memory decline with new agitation, paranoia, family couldn't get her to come in for appointment to evaluate; palliative care was ordered, not candidate for hospice; , NP, evaluated at home and recommended haldol which was initiated with benefit, increased to 2 mg TID, family giving dissolved in ensure.  However palliative felt would benefit from transition to Seroquel under monitored care; discussed involuntary commitment or memory care admission, family considered but declined this, has tapered down haldol down to 1 mg 1-2 times daily PRN with fair results though have noted mildly increased agitation, plan to start Seroquel today.   Former smoker, there was concern for safety in home, stopped this year  BMI is Body mass index is 27.25 kg/m., she has not been working on diet and exercise. Good appetite, drink 1-2 ensure daily, 1 pepsi daily,  Walks 10 min twice weekly  Wt Readings from Last 3 Encounters:  11/19/19 147 lb 12.8 oz (67 kg)  02/18/19 145 lb (65.8 kg)  10/22/18 143 lb 6.4 oz (65 kg)    Her blood pressure has been controlled at home, today their BP is BP: 112/70 She does workout. She denies chest pain, shortness of breath, dizziness.   She is not on cholesterol medication due to age, advancing dementia with behaviors and difficulty with pills. Her cholesterol is not at goal. The cholesterol last visit was:   Lab Results  Component Value Date   CHOL 177 02/18/2019   HDL 47 (L) 02/18/2019   LDLCALC 108 (H) 02/18/2019   TRIG 114 02/18/2019   CHOLHDL 3.8 02/18/2019    She has been working on diet and exercise for glucose managment, and denies increased appetite, nausea,  paresthesia of the feet, polydipsia, polyuria, visual disturbances, vomiting and weight loss. Last A1C in the office was:  Lab Results  Component Value Date   HGBA1C 5.1 02/18/2019   Last GFR: Lab Results  Component Value Date   GFRNONAA 56 (L) 02/18/2019   Patient is on Vitamin D supplement, reduced from 50000 daily to every 3 days  Lab Results  Component Value Date   VD25OH 132 (H) 02/18/2019      Medication Review: Current Outpatient Medications on File Prior to Visit  Medication Sig Dispense Refill  . Cholecalciferol (VITAMIN D3) 1.25 MG (50000 UT) CAPS Take 1 capsule 3  /week for Vitamin D Deficiency 36 capsule 3   No current facility-administered medications on file prior to visit.    No Known Allergies  Current Problems (verified) Patient Active Problem List   Diagnosis Date Noted  . Visual hallucinations 08/18/2019  . Gastroesophageal reflux disease without esophagitis 09/11/2018  . Former smoker 09/11/2018  . Vitamin D deficiency 09/11/2018  . Depression 09/11/2018  . Arthralgia of right hand 08/19/2018  . Dementia with behavioral disturbance (HCC) 10/21/2016  . Vertigo 10/14/2014  .  Benign essential HTN 10/14/2014  . Hyperlipidemia 10/14/2014    Screening Tests Immunization History  Administered Date(s) Administered  . Pneumococcal Conjugate-13 03/13/2002  . Pneumococcal Polysaccharide-23 03/14/2003  . Zoster 03/13/2013    Preventative care: Last colonoscopy: 2005, DONE due to age and advanced dementia Last mammogram: 2016 DONE Last pap smear/pelvic exam: remote, DONE DEXA: ordered   Prior vaccinations: TD or Tdap: declines age/cost  Influenza: declines  Pneumococcal: 2005 Prevnar13: 2004 Shingles/Zostavax: 2015 Covid 19:   Names of Other Physician/Practitioners you currently use: 1. [facility] here for primary care 2. , eye doctor, last visit remote, overdue, no vision problems 3. ? , dentist, last  visit ?, overdue, will schedule follow up   Patient Care Team: Lucky Cowboy, MD as PCP - General (Internal Medicine)  SURGICAL HISTORY She  has a past surgical history that includes Bladder surgery; Abdominal hysterectomy; Cataract extraction; Cholecystectomy; Knee surgery (Right); Eye surgery; and Colonoscopy (2005). FAMILY HISTORY Her family history is not on file. SOCIAL HISTORY She  reports that she quit smoking about 4 months ago. Her smoking use included cigarettes. She has a 27.50 pack-year smoking history. She has never used smokeless tobacco. She reports that she does not drink alcohol and does not use drugs.   MEDICARE WELLNESS OBJECTIVES: Physical activity: Current Exercise Habits: Home exercise routine, Type of exercise: walking, Time (Minutes): 10, Frequency (Times/Week): 2, Weekly Exercise (Minutes/Week): 20, Intensity: Mild, Exercise limited by: neurologic condition(s) Cardiac risk factors: Cardiac Risk Factors include: advanced age (>76men, >28 women);dyslipidemia;hypertension;sedentary lifestyle;smoking/ tobacco exposure Depression/mood screen:   Depression screen [facility] 2/9 11/19/2019  Decreased Interest 0  Down, Depressed, Hopeless 0  PHQ - 2 Score 0  Altered sleeping 0  Tired, decreased energy 0  Change in appetite 0  Feeling bad or failure about yourself  0  Trouble concentrating 0  Moving slowly or fidgety/restless 0  Suicidal thoughts 0  PHQ-9 Score 0  Difficult doing work/chores -    ADLs:  In your present state of health, do you have any difficulty performing the following activities: 11/19/2019  Hearing? N  Vision? N  Difficulty concentrating or making decisions? Y  Walking or climbing stairs? N  Dressing or bathing? N  Doing errands, shopping? Y  Comment family is driving  Quarry manager and eating ? N  Using the Toilet? N  In the past six months, have you accidently leaked urine? N  Do you have problems with loss of bowel control? N  Managing your  Medications? Y  Comment family manages  Managing your Finances? Y  Comment family  Housekeeping or managing your Housekeeping? Y  Comment family assists as needed 50/50  Some recent data might be hidden     Cognitive Testing  Alert? Yes  Normal Appearance?Yes  Oriented to person? Yes  Place? Yes   Time? NO  Recall of three objects?  NO  Can perform simple calculations? NO  Displays appropriate judgment? NO  Can read the correct time from a watch face? Yes  MMSE - Mini Mental State Exam 11/19/2019 01/05/2016  Orientation to time 0 2  Orientation to Place 5 3  Registration 2 3  Attention/ Calculation 0 3  Recall 0 2  Language- name 2 objects 2 2  Language- repeat 0 0  Language- follow 3 step command 3 3  Language- read & follow direction 1 1  Write a sentence 1 1  Copy design 1 0  Total score 15 20  EOL planning: Does Patient Have a Medical Advance Directive?: Yes Type of Advance Directive: Healthcare Power of Attorney, Living will Does patient want to make changes to medical advance directive?: No - Patient declined Copy of Healthcare Power of Attorney in Chart?: No - copy requested  Review of Systems  Constitutional: Negative for malaise/fatigue and weight loss.  HENT: Negative for hearing loss and tinnitus.   Eyes: Negative for blurred vision and double vision.  Respiratory: Negative for cough, shortness of breath and wheezing.   Cardiovascular: Negative for chest pain, palpitations, orthopnea, claudication and leg swelling.  Gastrointestinal: Negative for abdominal pain, blood in stool, constipation, diarrhea, heartburn, melena, nausea and vomiting.  Genitourinary: Negative.   Musculoskeletal: Negative for joint pain and myalgias.  Skin: Negative for rash.  Neurological: Negative for dizziness, tingling, sensory change, weakness and headaches.  Endo/Heme/Allergies: Negative for polydipsia.  Psychiatric/Behavioral: Positive for hallucinations (visual) and memory  loss. Negative for depression, substance abuse and suicidal ideas.  All other systems reviewed and are negative.    Objective:     Today's Vitals   11/19/19 1355  BP: 112/70  Pulse: (!) 52  Temp: 97.9 F (36.6 C)  SpO2: 93%  Weight: 147 lb 12.8 oz (67 kg)   Body mass index is 27.25 kg/m.  General appearance: alert, no distress, WD/WN, female HEENT: normocephalic, sclerae anicteric, TMs pearly, nares patent, no discharge or erythema, pharynx normal, top dentures, poor lower dentition Oral cavity: MMM, no lesions Neck: supple, no lymphadenopathy, no thyromegaly, no masses Heart: RRR, normal S1, S2, no murmurs Lungs: CTA bilaterally, no wheezes, rhonchi, or rales Abdomen: +bs, soft, non tender, non distended, no masses, no hepatomegaly, no splenomegaly Musculoskeletal: nontender, no swelling, mild left knee valgus Extremities: no edema, no cyanosis, no clubbing Pulses: 2+ symmetric, upper and lower extremities, normal cap refill Neurological: alert, oriented x 2, CN2-12 intact, strength normal upper extremities and lower extremities, sensation normal throughout, DTRs 2+ throughout, no cerebellar signs, gait slow steady, very poor short term memory, repeats questions, follows simple instructions, poor judgement, becomes mildly agitated with MMSE Psychiatric: normal affect, behavior normal, initially pleasant, mildly agitated with memory tests  Medicare Attestation I have personally reviewed: The patient's medical and social history Their use of alcohol, tobacco or illicit drugs Their current medications and supplements The patient's functional ability including ADLs,fall risks, home safety risks, cognitive, and hearing and visual impairment Diet and physical activities Evidence for depression or mood disorders  The patient's weight, height, BMI, and visual acuity have been recorded in the chart.  I have made referrals, counseling, and provided education to the patient based on  review of the above and I have provided the patient with a written personalized care plan for preventive services.     Dan Maker, NP   11/19/2019

## 2019-11-19 ENCOUNTER — Encounter: Payer: Self-pay | Admitting: Adult Health

## 2019-11-19 ENCOUNTER — Other Ambulatory Visit: Payer: Self-pay

## 2019-11-19 ENCOUNTER — Ambulatory Visit (INDEPENDENT_AMBULATORY_CARE_PROVIDER_SITE_OTHER): Payer: Medicare Other | Admitting: Adult Health

## 2019-11-19 VITALS — BP 112/70 | HR 52 | Temp 97.9°F | Wt 147.8 lb

## 2019-11-19 DIAGNOSIS — E559 Vitamin D deficiency, unspecified: Secondary | ICD-10-CM

## 2019-11-19 DIAGNOSIS — R441 Visual hallucinations: Secondary | ICD-10-CM

## 2019-11-19 DIAGNOSIS — E538 Deficiency of other specified B group vitamins: Secondary | ICD-10-CM | POA: Diagnosis not present

## 2019-11-19 DIAGNOSIS — Z0000 Encounter for general adult medical examination without abnormal findings: Secondary | ICD-10-CM

## 2019-11-19 DIAGNOSIS — R7309 Other abnormal glucose: Secondary | ICD-10-CM | POA: Diagnosis not present

## 2019-11-19 DIAGNOSIS — R350 Frequency of micturition: Secondary | ICD-10-CM | POA: Diagnosis not present

## 2019-11-19 DIAGNOSIS — I10 Essential (primary) hypertension: Secondary | ICD-10-CM

## 2019-11-19 DIAGNOSIS — E782 Mixed hyperlipidemia: Secondary | ICD-10-CM

## 2019-11-19 DIAGNOSIS — R6889 Other general symptoms and signs: Secondary | ICD-10-CM

## 2019-11-19 DIAGNOSIS — Z0001 Encounter for general adult medical examination with abnormal findings: Secondary | ICD-10-CM

## 2019-11-19 DIAGNOSIS — E2839 Other primary ovarian failure: Secondary | ICD-10-CM

## 2019-11-19 DIAGNOSIS — Z87891 Personal history of nicotine dependence: Secondary | ICD-10-CM

## 2019-11-19 DIAGNOSIS — R42 Dizziness and giddiness: Secondary | ICD-10-CM

## 2019-11-19 DIAGNOSIS — F32A Depression, unspecified: Secondary | ICD-10-CM

## 2019-11-19 DIAGNOSIS — F0391 Unspecified dementia with behavioral disturbance: Secondary | ICD-10-CM

## 2019-11-19 DIAGNOSIS — K219 Gastro-esophageal reflux disease without esophagitis: Secondary | ICD-10-CM

## 2019-11-19 DIAGNOSIS — F329 Major depressive disorder, single episode, unspecified: Secondary | ICD-10-CM

## 2019-11-19 MED ORDER — QUETIAPINE FUMARATE 25 MG PO TABS: ORAL_TABLET | ORAL | 0 refills | Status: DC

## 2019-11-19 NOTE — Patient Instructions (Addendum)
Kimberly Gregory , Thank you for taking time to come for your Medicare Wellness Visit. I appreciate your ongoing commitment to your health goals. Please review the following plan we discussed and let me know if I can assist you in the future.   These are the goals we discussed: Goals    . DIET - INCREASE WATER INTAKE     3-4 bottles per day, 65+ fluid ounces if possible    . Exercise 150 min/wk Moderate Activity     30 min x 5+ days weekly, for memory and mobility       This is a list of the screening recommended for you and due dates:  Health Maintenance  Topic Date Due  . COVID-19 Vaccine (1) Never done  . DEXA scan (bone density measurement)  Never done  . Flu Shot  06/10/2020*  . Pneumonia vaccines  Completed  . Tetanus Vaccine  Discontinued  *Topic was postponed. The date shown is not the original due date.     Stop haldol  Take 1 tab seroquel tonight; see how she does May increase to 2 tabs if needed - all at once, or 1 with dinner and 1 at bedtime Does cause some sedation  Will do phone follow up for seroquel in 2 weeks, or call sooner if any concerns   Quetiapine tablets What is this medicine? QUETIAPINE (kwe TYE a peen) is an antipsychotic. It is used to treat schizophrenia and bipolar disorder, also known as manic-depression. It also helps with agitation with dementia.   This medicine may be used for other purposes; ask your health care provider or pharmacist if you have questions. COMMON BRAND NAME(S): Seroquel What should I tell my health care provider before I take this medicine? They need to know if you have any of these conditions:  blockage in your bowel  cataracts  constipation  dehydration  diabetes  difficulty swallowing  glaucoma  heart disease  history of breast cancer  kidney disease  liver disease  low blood counts, like low white cell, platelet, or red cell counts  low blood pressure or dizziness when standing up  Parkinson's  disease  previous heart attack  prostate disease  seizures  stomach or intestine problems  suicidal thoughts, plans or attempt; a previous suicide attempt by you or a family member  thyroid disease  trouble passing urine  an unusual or allergic reaction to quetiapine, other medicines, foods, dyes, or preservatives  pregnant or trying to get pregnant  breast-feeding How should I use this medicine? Take this medicine by mouth. Swallow it with a drink of water. Follow the directions on the prescription label. If it upsets your stomach you can take it with food. Take your medicine at regular intervals. Do not take it more often than directed. Do not stop taking except on the advice of your doctor or health care professional. A special MedGuide will be given to you by the pharmacist with each prescription and refill. Be sure to read this information carefully each time. Talk to your pediatrician regarding the use of this medicine in children. While this drug may be prescribed for children as young as 10 years for selected conditions, precautions do apply. Patients over age 70 years may have a stronger reaction to this medicine and need smaller doses. Overdosage: If you think you have taken too much of this medicine contact a poison control center or emergency room at once. NOTE: This medicine is only for you. Do not share  this medicine with others. What if I miss a dose? If you miss a dose, take it as soon as you can. If it is almost time for your next dose, take only that dose. Do not take double or extra doses. What may interact with this medicine? Do not take this medicine with any of the following medications:  cisapride  dronedarone  fluconazole  metoclopramide  pimozide  posaconazole  thioridazine This medicine may also interact with the following medications:  alcohol  antihistamines for allergy cough and cold  antiviral medicines for HIV or  AIDS  atropine  certain medicines for bladder problems like oxybutynin, tolterodine  certain medicines for blood pressure  certain medicines for depression, anxiety, or psychotic disturbances  certain medicines for diabetes  certain medicines for stomach problems like dicyclomine, hyoscyamine  certain medicines for travel sickness like scopolamine  certain medicines for Parkinson's disease  certain medicines for seizures like carbamazepine, phenobarbital, phenytoin  cimetidine  erythromycin  ipratropium  other medicines that prolong the QT interval (cause an abnormal heart rhythm) like dofetilide  rifampin  steroid medicines like prednisone or cortisone This list may not describe all possible interactions. Give your health care provider a list of all the medicines, herbs, non-prescription drugs, or dietary supplements you use. Also tell them if you smoke, drink alcohol, or use illegal drugs. Some items may interact with your medicine. What should I watch for while using this medicine? Visit your health care professional for regular checks on your progress. Tell your health care professional if symptoms do not start to get better or if they get worse. Do not stop taking except on your health care professional's advice. You may develop a severe reaction. Your health care professional will tell you how much medicine to take. You may need to have an eye exam before and during use of this medicine. This medicine may increase blood sugar. Ask your health care provider if changes in diet or medicines are needed if you have diabetes. Patients and their families should watch out for new or worsening depression or thoughts of suicide. Also watch out for sudden or severe changes in feelings such as feeling anxious, agitated, panicky, irritable, hostile, aggressive, impulsive, severely restless, overly excited and hyperactive, or not being able to sleep. If this happens, especially at the  beginning of antidepressant treatment or after a change in dose, call your health care professional. Bonita Quin may get dizzy or drowsy. Do not drive, use machinery, or do anything that needs mental alertness until you know how this medicine affects you. Do not stand or sit up quickly, especially if you are an older patient. This reduces the risk of dizzy or fainting spells. Alcohol may interfere with the effect of this medicine. Avoid alcoholic drinks. This drug can cause problems with controlling your body temperature. It can lower the response of your body to cold temperatures. If possible, stay indoors during cold weather. If you must go outdoors, wear warm clothes. It can also lower the response of your body to heat. Do not overheat. Do not over-exercise. Stay out of the sun when possible. If you must be in the sun, wear cool clothing. Drink plenty of water. If you have trouble controlling your body temperature, call your health care provider right away. What side effects may I notice from receiving this medicine? Side effects that you should report to your doctor or health care professional as soon as possible:  allergic reactions like skin rash, itching or hives, swelling  of the face, lips, or tongue  breathing problems  changes in vision  confusion  elevated mood, decreased need for sleep, racing thoughts, impulsive behavior  eye pain  fast, irregular heartbeat  fever or chills, sore throat  inability to keep still  males: prolonged or painful erection  problems with balance, talking, walking  redness, blistering, peeling, or loosening of the skin, including inside the mouth  seizures  signs and symptoms of high blood sugar such as being more thirsty or hungry or having to urinate more than normal. You may also feel very tired or have blurry vision  signs and symptoms of hypothyroidism like fatigue; increased sensitivity to cold; weight gain; hoarseness; thinning hair  signs and  symptoms of low blood pressure like dizziness; feeling faint or lightheaded; falls; unusually weak or tired  signs and symptoms of neuroleptic malignant syndrome like confusion; fast, irregular heartbeat; high fever; increased sweating; stiff muscles  sudden numbness or weakness of the face, arm, or leg  suicidal thoughts or other mood changes  trouble swallowing  uncontrollable movements of the arms, face, head, mouth, neck, or upper body Side effects that usually do not require medical attention (report to your doctor or health care professional if they continue or are bothersome):  change in sex drive or performance  constipation  drowsiness  dry mouth  upset stomach  weight gain This list may not describe all possible side effects. Call your doctor for medical advice about side effects. You may report side effects to FDA at 1-800-FDA-1088. Where should I keep my medicine? Keep out of the reach of children. Store at room temperature between 15 and 30 degrees C (59 and 86 degrees F). Throw away any unused medicine after the expiration date. NOTE: This sheet is a summary. It may not cover all possible information. If you have questions about this medicine, talk to your doctor, pharmacist, or health care provider.  2020 Elsevier/Gold Standard (2018-12-25 11:59:11)

## 2019-11-24 ENCOUNTER — Telehealth: Payer: Self-pay

## 2019-11-24 NOTE — Telephone Encounter (Signed)
VM left for patient to reschedule visit from 11/25/19.

## 2019-11-26 LAB — CBC WITH DIFFERENTIAL/PLATELET
Absolute Monocytes: 527 cells/uL (ref 200–950)
Basophils Absolute: 37 cells/uL (ref 0–200)
Basophils Relative: 0.6 %
Eosinophils Absolute: 50 cells/uL (ref 15–500)
Eosinophils Relative: 0.8 %
HCT: 42.1 % (ref 35.0–45.0)
Hemoglobin: 14.1 g/dL (ref 11.7–15.5)
Lymphs Abs: 3106 cells/uL (ref 850–3900)
MCH: 28.7 pg (ref 27.0–33.0)
MCHC: 33.5 g/dL (ref 32.0–36.0)
MCV: 85.7 fL (ref 80.0–100.0)
MPV: 10 fL (ref 7.5–12.5)
Monocytes Relative: 8.5 %
Neutro Abs: 2480 cells/uL (ref 1500–7800)
Neutrophils Relative %: 40 %
Platelets: 291 10*3/uL (ref 140–400)
RBC: 4.91 10*6/uL (ref 3.80–5.10)
RDW: 14.5 % (ref 11.0–15.0)
Total Lymphocyte: 50.1 %
WBC: 6.2 10*3/uL (ref 3.8–10.8)

## 2019-11-26 LAB — COMPLETE METABOLIC PANEL WITH GFR
AG Ratio: 1.2 (calc) (ref 1.0–2.5)
ALT: 12 U/L (ref 6–29)
AST: 18 U/L (ref 10–35)
Albumin: 4 g/dL (ref 3.6–5.1)
Alkaline phosphatase (APISO): 77 U/L (ref 37–153)
BUN/Creatinine Ratio: 25 (calc) — ABNORMAL HIGH (ref 6–22)
BUN: 28 mg/dL — ABNORMAL HIGH (ref 7–25)
CO2: 29 mmol/L (ref 20–32)
Calcium: 9.5 mg/dL (ref 8.6–10.4)
Chloride: 106 mmol/L (ref 98–110)
Creat: 1.12 mg/dL — ABNORMAL HIGH (ref 0.60–0.88)
GFR, Est African American: 53 mL/min/{1.73_m2} — ABNORMAL LOW (ref >=60)
GFR, Est Non African American: 46 mL/min/{1.73_m2} — ABNORMAL LOW (ref >=60)
Globulin: 3.4 g/dL (calc) (ref 1.9–3.7)
Glucose, Bld: 84 mg/dL (ref 65–99)
Potassium: 4.6 mmol/L (ref 3.5–5.3)
Sodium: 142 mmol/L (ref 135–146)
Total Bilirubin: 0.3 mg/dL (ref 0.2–1.2)
Total Protein: 7.4 g/dL (ref 6.1–8.1)

## 2019-11-26 LAB — URINALYSIS W MICROSCOPIC + REFLEX CULTURE
Bilirubin Urine: NEGATIVE
Glucose, UA: NEGATIVE
Hgb urine dipstick: NEGATIVE
Nitrites, Initial: NEGATIVE
Protein, ur: NEGATIVE
Specific Gravity, Urine: 1.025 (ref 1.001–1.03)
pH: 6 (ref 5.0–8.0)

## 2019-11-26 LAB — HEMOGLOBIN A1C
Hgb A1c MFr Bld: 5.3 % of total Hgb (ref <5.7)
Mean Plasma Glucose: 105 (calc)
eAG (mmol/L): 5.8 (calc)

## 2019-11-26 LAB — MAGNESIUM: Magnesium: 2.1 mg/dL (ref 1.5–2.5)

## 2019-11-26 LAB — URINE CULTURE
MICRO NUMBER:: 10926625
SPECIMEN QUALITY:: ADEQUATE

## 2019-11-26 LAB — CULTURE INDICATED

## 2019-11-26 LAB — VITAMIN B12: Vitamin B-12: 426 pg/mL (ref 200–1100)

## 2019-11-26 LAB — VITAMIN D 25 HYDROXY (VIT D DEFICIENCY, FRACTURES): Vit D, 25-Hydroxy: 72 ng/mL (ref 30–100)

## 2019-11-26 LAB — TSH: TSH: 3.59 mIU/L (ref 0.40–4.50)

## 2019-12-01 ENCOUNTER — Telehealth: Payer: Self-pay

## 2019-12-01 NOTE — Telephone Encounter (Signed)
Son, [place], would like to discuss having his mother changed back to Haldol. Patient has upcoming phone visit.

## 2019-12-01 NOTE — Telephone Encounter (Signed)
Left message on voice mail  to call back

## 2019-12-02 ENCOUNTER — Telehealth: Payer: Self-pay

## 2019-12-02 NOTE — Telephone Encounter (Signed)
Just as a follow up: patient's son was informed that the SEROQUEL could take up to 4wks to take effect & that he could use the HALDOL as needed. And please keep Kimberly Gregory's phone visit on Thursday with the provider. Patient's son agreed & voiced understanding of the instructions.

## 2019-12-03 NOTE — Telephone Encounter (Signed)
See telephone encounter from 12/02/19.

## 2019-12-03 NOTE — Progress Notes (Signed)
Virtual Visit via Telephone Note  I connected with Kimberly Gregory on 12/03/19 at  4:00 PM EDT by telephone and verified that I am speaking with the correct person using two identifiers.  Location: Patient: home Provider: [facility] office   I discussed the limitations, risks, security and privacy concerns of performing an evaluation and management service by telephone and the availability of in person appointments. I also discussed with the patient that there may be a patient responsible charge related to this service. The patient expressed understanding and agreed to proceed.  I discussed the assessment and treatment plan with the patient. The patient was provided an opportunity to ask questions and all were answered. The patient agreed with the plan and demonstrated an understanding of the instructions.   The patient was advised to call back or seek an in-person evaluation if the symptoms worsen or if the condition fails to improve as anticipated.  I provided 15 minutes of non-face-to-face time during this encounter.   Kimberly Maker, NP   Assessment and Plan:  Kimberly Gregory was seen today for follow-up.  Diagnoses and all orders for this visit:  Dementia with behavioral disturbance, unspecified dementia type (HCC) Visual hallucinations Patient with significantly increased agitation off of haldol questionable reduced reaction to internal stimuli with low dose Seroquel; does remain difficult to direct, won't change clothing or promps to increase oral fluid intake since stopping haldol Family feels she is much improved with haldol 1 mg in AM, seroquel 25 mg PM; they would like to continue this from quality of life standpoint Discussed appropriate for mid after noon 2nd dose of low dose haldol if needed Reviewed in elder increased agitation may be a sign of infection (pneumonia, UTI) in absence of typical sx; important to use med PRN and note any increased agitation from baseline, present for  evaluation if this is noted Goals of care reviewed; quality of life and remaining at home as long as possible is a priority We have discussed risks associated with each medication; family understands risks of falls and increased mortality and wish to continue with the above plan.  Will continue to evaluate response at each visit for benefit and opportunity taper off.   Further disposition pending results of labs. Discussed med's effects and SE's.   Over 20 minutes of exam, counseling, chart review, and critical decision making was performed.   Future Appointments  Date Time Provider Department Center  12/04/2019  4:00 PM Kimberly Gaudier, NP [facility] None  05/27/2020 10:00 AM Kimberly Gaudier, NP [facility] None  11/29/2020  2:00 PM Kimberly Gaudier, NP [facility] None    ------------------------------------------------------------------------------------------------------------------   HPI 84 y.o.female with mod/advanced dementia with hx of visual hallucinations and agitation presents for 2 week follow up. She had normal routine labs other than renal functions trending down on 11/19/2019 in office on. Today I am primarily spoke with Son, Kimberly Gregory who is caregiver with his sister due to advanced dementia.   She had recently been tapered off of haldol and initiated on seroquel 25 mg daily per palliative care team recommendation for visual hallucinations and agitation. Per son, since that time she has had notably increased agitation, very difficult to direct, will not change her clothing, resistant to drinking water. Unsure how hallucinations are doing due to agitation. While on haldol she would still speak into mirror, but with much less agitation, yelling. They called in to office a few days back, report spoke with Kimberly Kennedy, NP here and did restart haldol 1 mg  in AM and have noted she is much more receptive to guidance for taking meds, bathing, much less agitated and upset though still not to the extent when  taking TID.    Past Medical History:  Diagnosis Date   Acute encephalopathy 06/13/2016   AKI (acute kidney injury) (HCC) 06/13/2016   Arthritis    Chronic kidney disease    Dementia (HCC)    Depression 09/11/2018   Gastroesophageal reflux disease without esophagitis 09/11/2018   Headache    Hypercholesterolemia    Hypertension    Ischemic colitis (HCC) 10/24/2016   Laxative abuse 10/21/2016   SIRS (systemic inflammatory response syndrome) (HCC) 10/21/2016   Vertigo 10/14/2014     No Known Allergies  Current Outpatient Medications on File Prior to Visit  Medication Sig   Cholecalciferol (VITAMIN D3) 1.25 MG (50000 UT) CAPS Take 1 capsule 3  /week for Vitamin D Deficiency   QUEtiapine (SEROQUEL) 25 MG tablet Take 1 tab in the evening with dinner. Repeat prior to bedtime if needed for agitation.   No current facility-administered medications on file prior to visit.    ROS: all negative except above.   Physical Exam:  There were no vitals taken for this visit.  General : Well sounding patient in no apparent distress HEENT: no hoarseness, no cough for duration of visit Lungs: speaks in brief , no audible wheezing, no apparent distress Neurological: alert, oriented x 2  Psychiatric: pleasant, judgement poor   Kimberly Maker, NP 1:38 PM [facility]

## 2019-12-04 ENCOUNTER — Encounter: Payer: Self-pay | Admitting: Adult Health

## 2019-12-04 ENCOUNTER — Other Ambulatory Visit: Payer: Self-pay

## 2019-12-04 ENCOUNTER — Ambulatory Visit: Payer: Medicare Other | Admitting: Adult Health

## 2019-12-04 DIAGNOSIS — F0391 Unspecified dementia with behavioral disturbance: Secondary | ICD-10-CM

## 2019-12-04 DIAGNOSIS — R441 Visual hallucinations: Secondary | ICD-10-CM | POA: Diagnosis not present

## 2019-12-09 ENCOUNTER — Telehealth: Payer: Self-pay

## 2019-12-09 ENCOUNTER — Telehealth: Payer: Self-pay | Admitting: Nurse Practitioner

## 2019-12-09 ENCOUNTER — Other Ambulatory Visit: Payer: Medicare Other | Admitting: Nurse Practitioner

## 2019-12-09 ENCOUNTER — Other Ambulatory Visit: Payer: Self-pay

## 2019-12-09 ENCOUNTER — Other Ambulatory Visit: Payer: Self-pay | Admitting: Adult Health

## 2019-12-09 MED ORDER — QUETIAPINE FUMARATE 25 MG PO TABS: ORAL_TABLET | ORAL | 0 refills | Status: DC

## 2019-12-09 MED ORDER — HALOPERIDOL 1 MG PO TABS: ORAL_TABLET | ORAL | 2 refills | Status: DC

## 2019-12-09 NOTE — Telephone Encounter (Signed)
Palliative Care NP requesting a call back to discuss patient's medication. Please call her at [phone]

## 2019-12-09 NOTE — Telephone Encounter (Signed)
I called Mr. Pais, Ms. Coard son for confirmation of f/u PC visit today, Mr. Moffet endorses recent medication changes with haldol and seroquel. Mr. Engelstad endorses with attempting to remove haldol, Ms. Kight hallucinations have worsen. Mr. Nickle endorses they discussed with [name] NP and added Haldol back. Mr. Shorb and I talked at length about concern of haldol with side effects in elderly dementia people. We talked about seroquel dosing, and also another medication should seroquel not improving behaviors olazapine. Discussed with Mr. Lizardi will f/u with [name] NP for further discussion of behaviors, medications. Mr. Burston endorses with medicaiton changes and behaviors would be best to reschedule today pc f/u visit for 2 weeks until medications improve behaviors. Rescheduled per wishes. Contact information provided. I have contacted [name] NP, message left for further discussion medications options.

## 2019-12-09 NOTE — Telephone Encounter (Signed)
Spoke with Joylene Igo, son/POA for the patient. Discussed Palliative NP Christin Gusler's concern with haldol risks and her recommendation to increase seroquel to 25 mg BID and discontinue haldol. He is very concerned about patient agitation off of haldol, but is agreeable to trying this. Discussed possible outpatient psych evaluation/management in home opportunity. He is agreeable to trying this. He again emphasizes family preference is for this patient to not be admitted to a facility, goal is for quality of life and remaining in her home. I believe a home psych resource with expertise in management of dementia related agitation and psychosis would be ideal for best achieving this. Will coordinate for this at soonest opportunity pending communication from Ms. Gusler.

## 2019-12-09 NOTE — Telephone Encounter (Signed)
Spoke with Palliative Care NP Terrilee Files, regarding her concern with this patient being prescribed haldol for management of agitation; family was struggling to manage patient with agitation secondary to hallucinations, haldol was added short term while seroquel was initiated per previous palliative provider recommendation. Ms. Reginold Agent recommends increasing seroquel to 25 mg BID, if no improvement to consider olazapine 5 mg TID plan alternatively, haldol not recommended for outpatient management. I expressed that  discussed strong priority for patient family to keep patient out of a facility. Ideally Ms. Lineman should have psych specialist who is able to directly manage the patient as agitation has prevented family from being able to bring her into office on several occasions. Ms. Reginold Agent if familiar with outpatient psych resource that would be able to evaluate and manage her medications in home and will follow up with me on this. I appreciate her expertise and assistance in caring for our mutual patient. Will plan to increase seroquel dose as discussed and fully discontinue haldol after follow up with Ms. Goga son (POA).

## 2019-12-10 ENCOUNTER — Other Ambulatory Visit: Payer: Self-pay | Admitting: Adult Health

## 2019-12-10 DIAGNOSIS — F0391 Unspecified dementia with behavioral disturbance: Secondary | ICD-10-CM

## 2019-12-10 DIAGNOSIS — R441 Visual hallucinations: Secondary | ICD-10-CM

## 2019-12-10 DIAGNOSIS — F03911 Unspecified dementia, unspecified severity, with agitation: Secondary | ICD-10-CM

## 2019-12-19 ENCOUNTER — Telehealth: Payer: Self-pay | Admitting: Adult Health

## 2019-12-19 MED ORDER — QUETIAPINE FUMARATE 25 MG PO TABS: ORAL_TABLET | ORAL | 0 refills | Status: DC

## 2019-12-19 NOTE — Telephone Encounter (Signed)
Spoke with patient's son (primary caregiver) re: hallucinations/agitation and progress with seroquel. Discussed palliative NP recommendation to transition to olazapine 5 mg TID if remains agitated; we had discussed trial of seroquel 25 mg AM, 50 mg PM at previous visit and reports tried this last night, patient seems to be doing well today. Would like to continue this for a few days over the weekend to evaluate benefit prior to transition of medication. He states will follow up with office via phone after the weekend. Palliative has upcoming in home follow up for in person evaluation 12/30/2019. Encouraged close monitoring and to call office sooner if any concerns.

## 2019-12-25 ENCOUNTER — Telehealth: Payer: Self-pay

## 2019-12-25 NOTE — Telephone Encounter (Signed)
Left message on voice mail  to call back

## 2019-12-25 NOTE — Telephone Encounter (Signed)
-----   Message from [name], NP sent at 12/23/2019  5:40 PM EDT ----- Please follow up with son [name] about how patient is doing with increased seroquel over the weekend - if not significantly improved, per palliative will try switch to olazapine 5 mg TID which I discussed with him already on Friday. He was supposed to call me back on Tuesday to follow up but never did- Thanks!

## 2019-12-26 ENCOUNTER — Telehealth: Payer: Self-pay | Admitting: Adult Health

## 2019-12-26 NOTE — Telephone Encounter (Signed)
[name], patient's son and POA/primary caregiver was supposed to contact office on 12/23/2019 to follow up with progress with seroquel dose increase to evaluate benefit and whether transition to olazapine 5 mg TID as recommended by palliative care NP was indiacated. CMA has been able to reach him on multiple attempts this week. I attempted personally today around 1300 x 2 with no answer. Left brief voice mail for him to call back on Monday as office is down closed. Christin Gusler, NP with palliative care will be following up in home on 12/30/2019. She was sent communication to update on current status with med management.

## 2019-12-30 ENCOUNTER — Other Ambulatory Visit: Payer: Medicare Other | Admitting: Nurse Practitioner

## 2019-12-30 ENCOUNTER — Encounter: Payer: Self-pay | Admitting: Nurse Practitioner

## 2019-12-30 ENCOUNTER — Other Ambulatory Visit: Payer: Self-pay

## 2019-12-30 DIAGNOSIS — Z515 Encounter for palliative care: Secondary | ICD-10-CM

## 2019-12-30 DIAGNOSIS — F0391 Unspecified dementia with behavioral disturbance: Secondary | ICD-10-CM

## 2019-12-30 NOTE — Progress Notes (Signed)
Therapist, nutritional Palliative Care Consult Note Telephone: [phone]  Fax: [phone]  PATIENT NAME: Kimberly Gregory DOB: [birth year] MRN: [id number]  PRIMARY CARE PROVIDER:   Lucky Cowboy, MD  REFERRING PROVIDER:  Lucky Cowboy, MD [address] [place],  [place] [address]  RESPONSIBLE PARTY:   Delana Meyer [facility]  RECOMMENDATIONS and PLAN:  1.ACP; Full code, will need to revisit at next [facility] visit and further goc  2. Dementia with behaviors; continue with seroquel 50mg  qhs; outpatient psychiatry referral. May need to give a dose seroquel 25mg  prior to appointment to get her to leave her home. Continue to d/c haldol  3.Palliative care encounter; Palliative medicine team will continue to support patient, patient's family, and medical team. Visit consisted of counseling and education dealing with the complex and emotionally intense issues of symptom management and palliative care in the setting of serious and potentially life-threatening illness  4. F/u visit 6 weeks for ongoing monitoring hallucinations, behaviors, progression dementia  I spent 60 minutes providing this consultation,  from 3:00pm to 4:00pm. More than 50% of the time in this consultation was spent coordinating communication.   HISTORY OF PRESENT ILLNESS:  Kimberly Gregory is a 84 y.o. year old female with multiple medical problems including dementia with behaviors, GERD, HTN, HLD. 9 / 23 / 2021 office visit with 97 Nurse Practitioner for dementia with behavioral disturbances and visual hallucinations. Continue with Seroquel scheduled. In-person follow-up palliative care visit with Kimberly Gregory, [birth year]24 and daughter Kimberly Gregory. I visited Kimberly Gregory in her own home. Kimberly Gregory was cooperative endorsing that she is doing fine, did not need to visit. Prior to visit Kimberly Gregory and I talked on the phone and discussed update with Seroquel. Morning dose of Seroquel 25 was making her to [facility]  through the day so they did stop that. Kimberly. Gregory is getting Seroquel 50 mg at bedtime and asleep throughout the night. [facility] Gregory the Haldol did work the best although that has been discontinued. When visiting Kimberly [facility] she repetitively talked about her doing fine and not needing anything. We talked about symptoms of pain but she denies. Kimberly Gregory her appetite has been good as she has actually gained some weight. Kimberly. Hannan has been eating and taking supplements also. Kimberly Gregory endorses Kimberly. Lemler eats a lot of peanut butter and jelly sandwiches. They keep her cabinet and food stocked up and she does eat when she is by herself. Kimberly Gregory endorses Kimberly. Galgano comes in the morning gets her up in the morning routine, they eat breakfast take medicine and watch TV most of the day. [facility] comes in the evening and makes dinner stays until Kimberly. Mcpherson goes to bed and is asleep. Kimberly Gregory endorse is he and Kimberly Gregory both have come out middle of the night to check on her and she is always asleep. We talked about challenges with irritability and resistance which has seemed to level with Seroquel. We talked about concern for worsening hallucinations. We talked about an outpatient Psychiatry appointment in person. We talked about the difficulty getting her to an appointment. Discuss that she also needs to have a dental exam and visit. We talked about possible giving her 25 mg of Seroquel prior to her doctor's appointment to help get her through the visit. Kimberly Gregory that they are going to try to get her outside more with the weather cooling down. Family is getting ready to start to build a deck on the back of her  house so they will be more people there and she will probably be outside socializing. We talked about different options for Psychiatry appointment will check with insurance to which psychiatrist is covered. We talked about Haldol at length in concerns for side effects. We talked about Kimberly Gregory feeling that the Haldol did work the  best though the concern for dementia in an outpatient setting their Primary Care. Kimberly Gregory they understand the concern and it has been discontinued. Discussed to convey to the Psychiatrist that the Haldol did help and possibly could be another medication that would be just as beneficial in this situation with her hallucinations and paranoia, resistance to care. Kimberly Gregory endorses Kimberly. Bushey has not recently prevented them from coming in the house. Kimberly Gregory Gregory her personality overall prior to diagnosis of dementia she was a difficult person, unkind at times and some of these behaviors are routine for her. We talked about progression of dementia. We talked about medical goals of care. We talked about stressors for caregivers and praised Kimberly Gregory and Kimberly Gregory for the work they are doing with Kimberly Gregory. Kimberly. Kimberly. Amrein was cooperative with assessment and agreeable. Kimberly. Boyden did make eye contact and attempt to be interactive. Kimberly. Austad did repetitively say she was fine and did not need anything. We talked about role of palliative care and plan of care. Discuss will follow up in 6 weeks if needed or sooner should she declined. No one in agreement, appointment schedule. Therapeutic listening, emotional support provided. Contact information. Questions answered to satisfaction. Will contact Kimberly Gaudier NP Primary for update on visit and request a referral for Outpatient Psychiatric visit according to who her insurance will cover.  Palliative Care was asked to help to continue to address goals of care.   CODE STATUS: Full code  PPS: 50% HOSPICE ELIGIBILITY/DIAGNOSIS: TBD  PAST MEDICAL HISTORY:  Past Medical History:  Diagnosis Date  . Acute encephalopathy 06/13/2016  . AKI (acute kidney injury) (HCC) 06/13/2016  . Arthritis   . Chronic kidney disease   . Dementia (HCC)   . Depression 09/11/2018  . Gastroesophageal reflux disease without esophagitis 09/11/2018  . Headache   . Hypercholesterolemia   . Hypertension   .  Ischemic colitis (HCC) 10/24/2016  . Laxative abuse 10/21/2016  . SIRS (systemic inflammatory response syndrome) (HCC) 10/21/2016  . Vertigo 10/14/2014    SOCIAL HX:  Social History   Tobacco Use  . Smoking status: Former Smoker    Packs/day: 0.50    Years: 55.00    Pack years: 27.50    Types: Cigarettes    Quit date: 07/12/2019    Years since quitting: 0.4  . Smokeless tobacco: Never Used  . Tobacco comment: Stopped smoking May 2021 per family  Substance Use Topics  . Alcohol use: No    ALLERGIES: No Known Allergies   PERTINENT MEDICATIONS:  Outpatient Encounter Medications as of 12/30/2019  Medication Sig  . Cholecalciferol (VITAMIN D3) 1.25 MG (50000 UT) CAPS Take 1 capsule 3  /week for Vitamin D Deficiency  . QUEtiapine (SEROQUEL) 25 MG tablet Take 1 tab in AM, 2 tabs PM daily for hallucinations and agitation.   No facility-administered encounter medications on file as of 12/30/2019.    PHYSICAL EXAM:   General: NAD, frail appearing, thin, confused female Cardiovascular: regular rate and rhythm Pulmonary: clear ant fields Neurological: ambulatory  Ahmiyah Coil Prince Rome, NP

## 2019-12-31 ENCOUNTER — Other Ambulatory Visit: Payer: Self-pay | Admitting: Adult Health

## 2019-12-31 DIAGNOSIS — F32A Depression, unspecified: Secondary | ICD-10-CM

## 2019-12-31 DIAGNOSIS — F0391 Unspecified dementia with behavioral disturbance: Secondary | ICD-10-CM

## 2019-12-31 DIAGNOSIS — R441 Visual hallucinations: Secondary | ICD-10-CM

## 2019-12-31 MED ORDER — QUETIAPINE FUMARATE 25 MG PO TABS: ORAL_TABLET | ORAL | 5 refills | Status: DC

## 2019-12-31 NOTE — Progress Notes (Signed)
Received communication from palliative NP Christin Gusler advising patient is currently taking seroquel 50 mg PM, was not taking AM dose due to sedation but recommending allow PRN use prior to OVs/dental visits. Script updated. She also recommends referral for outpatient psych. Placed today.

## 2020-01-22 ENCOUNTER — Other Ambulatory Visit: Payer: Self-pay | Admitting: Adult Health

## 2020-01-30 ENCOUNTER — Telehealth: Payer: Self-pay | Admitting: Nurse Practitioner

## 2020-01-30 NOTE — Telephone Encounter (Signed)
Spoke with son to see if it was okay if we rescheduled the 02/17/20 Palliative f/u visit to 02/16/20 @ 12:30 PM and he was in agreement with this.

## 2020-02-16 ENCOUNTER — Other Ambulatory Visit: Payer: Medicare Other | Admitting: Nurse Practitioner

## 2020-02-16 ENCOUNTER — Other Ambulatory Visit: Payer: Self-pay

## 2020-02-16 ENCOUNTER — Encounter: Payer: Self-pay | Admitting: Nurse Practitioner

## 2020-02-16 DIAGNOSIS — Z515 Encounter for palliative care: Secondary | ICD-10-CM

## 2020-02-16 DIAGNOSIS — F0391 Unspecified dementia with behavioral disturbance: Secondary | ICD-10-CM

## 2020-02-16 NOTE — Progress Notes (Signed)
Therapist, nutritional Palliative Care Consult Note Telephone: [phone]  Fax: [phone]  PATIENT NAME: Kimberly Gregory DOB: 1935/09/17 MRN: [id number]  PRIMARY CARE PROVIDER:   Lucky Cowboy, MD  REFERRING PROVIDER:  Lucky Cowboy, MD [address] [place],  [place] [address]   RESPONSIBLE PARTY:   Delana Meyer [facility]  RECOMMENDATIONS and PLAN: 1.ACP; Full code, will need to revisit at next [facility] visit and further goc  2. Dementia with behaviors; continue with seroquel 50mg  qhs; outpatient psychiatry referral. May need to give a dose seroquel 25mg  prior to appointment to get her to leave her home. Continue to d/c haldol  3.Palliative care encounter; Palliative medicine team will continue to support patient, patient's family, and medical team. Visit consisted of counseling and education dealing with the complex and emotionally intense issues of symptom management and palliative care in the setting of serious and potentially life-threatening illness  I spent 60 minutes providing this consultation,  from 12:30pm to 1:30pm. More than 50% of the time in this consultation was spent coordinating communication.   HISTORY OF PRESENT ILLNESS:  Kimberly Gregory is a 84 y.o. year old female with multiple medical problems including dementia with behaviors, GERD, HTN, HLD. In person f/u visit with Kimberly Gregory, her son 89, daughter [name] for Palliative consult for ongoing monitoring progression of dementia, appetite, weight, behaviors of agitation. We talked about purpose of Palliative care visit, [name] in agreement. We talked about how Kimberly Gregory was doing. Kimberly Gregory was sitting on the couch in her living room. KimberlyGregory endorses she is just fine. Ms Gregory was cooperative with assessment. Kimberly Gregory endorses she has eaten too much, feels bloated. [name] endorses Kimberly Gregory remains ambulatory, no recent falls. Kimberly Gregory has refused to take a bath for a long time now but  agree to change her clothes. [name] endorses episodes of urinary incontinence. No known last bowel movement. Kimberly Gregory, daughter endorses she does know she goes as finds evidence in her clothes, bathroom. Kimberly Gregory and Kimberly Gregory change off staying with Kimberly Gregory with frequent checks. [name] endorses appetite has been okay, stopped giving her pepsi, continue with water and ensure. [name] endorses does not appear to have lost weight. [name] endorses they are giving seroquel 25mg  qam/qpm which has helped. [name] endorses when he was giving seroquel 50mg  at bedtime but it was making her too sleepy so he went back to 25mg  qhs. [name] endorses Kimberly Gregory is redirectable. We talked about weights, no current weights, no change in the size of clothes. We reviewed medical goals of care. We talked about planning for LTC possible locked memory care unit. We talked about process. in agreement to go look at the facilities, when decided will help to get FL2 completed. Kimberly Gregory in agreement, Reviewed PC in GOC, f/u appointment scheduled 4 week, therapeutic listening, questions answered, emotional support provided. Contact information.   Palliative Care was asked to help to continue to address goals of care.   CODE STATUS: Full code  PPS: 50% HOSPICE ELIGIBILITY/DIAGNOSIS: TBD  PAST MEDICAL HISTORY:  Past Medical History:  Diagnosis Date  . Acute encephalopathy 06/13/2016  . AKI (acute kidney injury) (HCC) 06/13/2016  . Arthritis   . Chronic kidney disease   . Dementia (HCC)   . Depression 09/11/2018  . Gastroesophageal reflux disease without esophagitis 09/11/2018  . Headache   . Hypercholesterolemia   . Hypertension   . Ischemic colitis (HCC) 10/24/2016  . Laxative abuse 10/21/2016  .  SIRS (systemic inflammatory response syndrome) (HCC) 10/21/2016  . Vertigo 10/14/2014    SOCIAL HX:  Social History   Tobacco Use  . Smoking status: Former Smoker    Packs/day: 0.50    Years: 55.00    Pack years: 27.50    Types: Cigarettes    Quit  date: 07/12/2019    Years since quitting: 0.6  . Smokeless tobacco: Never Used  . Tobacco comment: Stopped smoking May 2021 per family  Substance Use Topics  . Alcohol use: No    ALLERGIES: No Known Allergies   PERTINENT MEDICATIONS:  Outpatient Encounter Medications as of 02/16/2020  Medication Sig  . Cholecalciferol (VITAMIN D3) 1.25 MG (50000 UT) CAPS Take 1 capsule 3  /week for Vitamin D Deficiency  . QUEtiapine (SEROQUEL) 25 MG tablet TAKE 1 TABLET BY MOUTH EVERY MORNING AS NEEDED AND 2 TABS EVERY EVENING FOR HALLUCINATIONS/AGITATION   No facility-administered encounter medications on file as of 02/16/2020.    PHYSICAL EXAM:   General: NAD, confused, pleasant female Cardiovascular: regular rate and rhythm Pulmonary: clear ant fields Neurological: ambulatory  Kimberly Paules Prince Rome, NP

## 2020-02-17 ENCOUNTER — Other Ambulatory Visit: Payer: Medicare Other | Admitting: Nurse Practitioner

## 2020-03-02 ENCOUNTER — Telehealth: Payer: Self-pay

## 2020-03-02 NOTE — Telephone Encounter (Signed)
2:38PM: Palliative care SW outreached patients son, per NP request.  SW LVM. Awaiting return call.

## 2020-03-05 ENCOUNTER — Telehealth: Payer: Self-pay | Admitting: Nurse Practitioner

## 2020-03-05 NOTE — Telephone Encounter (Signed)
Called patient's son to see if we could reschedule the Palliative f/u visit on 03/23/20 to 04/01/20, no answer - left message requesting a return call to let me know if this would be okay.

## 2020-03-08 ENCOUNTER — Telehealth: Payer: Self-pay

## 2020-03-08 NOTE — Telephone Encounter (Signed)
12:20PM: Palliative care SW outreached patients, [name], per NP request.  Son shared that he and his family have been discussing long term care planning and possible placement for patient. Son shares that patients memory is declining and they are trying to take her quality of life into account. Son has applied for Medicaid and is aware of the processing time frame. SW made son aware that an  FL2 would need to be completed by PCP in order for appropriate placement to take place, No other questions/concerns at this time. Son has SW contact if needed.  Son stated that he received VM from NP and confirmed that it is okay for NP to visit 04/01/20. SW made NP aware.

## 2020-03-10 ENCOUNTER — Telehealth: Payer: Self-pay | Admitting: Nurse Practitioner

## 2020-03-10 NOTE — Telephone Encounter (Signed)
Called patient's daughter [name], to see if we could reschedule the Palliative f/u visit on 03/23/20, no answer - left message requesting a return call.  Left my name and call back number.

## 2020-03-10 NOTE — Telephone Encounter (Signed)
Spoke with patient's son Khaliyah Northrop, to see if we could reschedule the Palliative f/u visit on 03/23/20 to 04/01/20 @ 9 AM and he was in agreement with this.

## 2020-03-23 ENCOUNTER — Other Ambulatory Visit: Payer: Medicare Other | Admitting: Nurse Practitioner

## 2020-04-01 ENCOUNTER — Other Ambulatory Visit: Payer: Medicare Other | Admitting: Nurse Practitioner

## 2020-04-15 ENCOUNTER — Telehealth: Payer: Self-pay

## 2020-04-15 NOTE — Telephone Encounter (Signed)
Patient's son states that Halidol has ran out. Requesting a refill on this. Seroquel isn't strong enough. Please advise.

## 2020-04-16 ENCOUNTER — Other Ambulatory Visit: Payer: Self-pay | Admitting: Adult Health

## 2020-04-16 MED ORDER — OLANZAPINE 5 MG PO TABS: ORAL_TABLET | ORAL | 2 refills | Status: DC

## 2020-04-16 NOTE — Telephone Encounter (Signed)
Left message on voice mail  to call back

## 2020-04-16 NOTE — Telephone Encounter (Signed)
[name] states that his mother is going backwards in behavior. Becoming combative and destroying things in the house and is verbally abusive too. Agreeing to trying the new medication. Please send in script. Is planning on following up with Palliative Care as well.

## 2020-04-20 ENCOUNTER — Other Ambulatory Visit: Payer: Self-pay

## 2020-04-20 ENCOUNTER — Telehealth: Payer: Self-pay | Admitting: Nurse Practitioner

## 2020-04-20 ENCOUNTER — Other Ambulatory Visit: Payer: Medicare Other | Admitting: Nurse Practitioner
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
## 2020-04-21 ENCOUNTER — Encounter (HOSPITAL_COMMUNITY): Payer: Self-pay | Admitting: *Deleted

## 2020-04-21 ENCOUNTER — Emergency Department (HOSPITAL_COMMUNITY): Payer: Medicare Other

## 2020-04-21 ENCOUNTER — Other Ambulatory Visit: Payer: Self-pay

## 2020-04-21 ENCOUNTER — Emergency Department (HOSPITAL_COMMUNITY)
Admission: EM | Admit: 2020-04-21 | Discharge: 2020-04-28 | Disposition: A | Discharge status: Home / Self Care | Payer: Medicare Other | Attending: Emergency Medicine | Admitting: Emergency Medicine

## 2020-04-21 DIAGNOSIS — Z87891 Personal history of nicotine dependence: Secondary | ICD-10-CM | POA: Insufficient documentation

## 2020-04-21 DIAGNOSIS — Z046 Encounter for general psychiatric examination, requested by authority: Secondary | ICD-10-CM

## 2020-04-21 DIAGNOSIS — F0391 Unspecified dementia with behavioral disturbance: Secondary | ICD-10-CM

## 2020-04-21 DIAGNOSIS — Z20822 Contact with and (suspected) exposure to covid-19: Secondary | ICD-10-CM | POA: Insufficient documentation

## 2020-04-21 DIAGNOSIS — R259 Unspecified abnormal involuntary movements: Secondary | ICD-10-CM | POA: Diagnosis not present

## 2020-04-21 DIAGNOSIS — N189 Chronic kidney disease, unspecified: Secondary | ICD-10-CM | POA: Insufficient documentation

## 2020-04-21 DIAGNOSIS — R0902 Hypoxemia: Secondary | ICD-10-CM | POA: Diagnosis not present

## 2020-04-21 DIAGNOSIS — R4182 Altered mental status, unspecified: Secondary | ICD-10-CM | POA: Diagnosis not present

## 2020-04-21 DIAGNOSIS — Z743 Need for continuous supervision: Secondary | ICD-10-CM | POA: Diagnosis not present

## 2020-04-21 DIAGNOSIS — R6889 Other general symptoms and signs: Secondary | ICD-10-CM | POA: Diagnosis not present

## 2020-04-21 DIAGNOSIS — M1711 Unilateral primary osteoarthritis, right knee: Secondary | ICD-10-CM | POA: Diagnosis not present

## 2020-04-21 DIAGNOSIS — I129 Hypertensive chronic kidney disease with stage 1 through stage 4 chronic kidney disease, or unspecified chronic kidney disease: Secondary | ICD-10-CM | POA: Diagnosis not present

## 2020-04-21 DIAGNOSIS — R404 Transient alteration of awareness: Secondary | ICD-10-CM | POA: Diagnosis not present
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
## 2020-04-22 ENCOUNTER — Emergency Department (HOSPITAL_COMMUNITY): Payer: Medicare Other

## 2020-04-22 DIAGNOSIS — M1711 Unilateral primary osteoarthritis, right knee: Secondary | ICD-10-CM | POA: Diagnosis not present

## 2020-04-22 LAB — URINALYSIS, ROUTINE W REFLEX MICROSCOPIC
Bilirubin Urine: NEGATIVE
Glucose, UA: NEGATIVE mg/dL
Hgb urine dipstick: NEGATIVE
Ketones, ur: NEGATIVE mg/dL
Nitrite: NEGATIVE
Protein, ur: NEGATIVE mg/dL
Specific Gravity, Urine: 1.02 (ref 1.005–1.030)
pH: 6 (ref 5.0–8.0)

## 2020-04-22 LAB — RAPID URINE DRUG SCREEN, HOSP PERFORMED
Amphetamines: NOT DETECTED
Barbiturates: NOT DETECTED
Benzodiazepines: POSITIVE — AB
Cocaine: NOT DETECTED
Opiates: NOT DETECTED
Tetrahydrocannabinol: NOT DETECTED

## 2020-04-22 LAB — COMPREHENSIVE METABOLIC PANEL
ALT: 15 U/L (ref 0–44)
AST: 32 U/L (ref 15–41)
Albumin: 3.4 g/dL — ABNORMAL LOW (ref 3.5–5.0)
Alkaline Phosphatase: 67 U/L (ref 38–126)
Anion gap: 9 (ref 5–15)
BUN: 29 mg/dL — ABNORMAL HIGH (ref 8–23)
CO2: 26 mmol/L (ref 22–32)
Calcium: 9.3 mg/dL (ref 8.9–10.3)
Chloride: 106 mmol/L (ref 98–111)
Creatinine, Ser: 1.39 mg/dL — ABNORMAL HIGH (ref 0.44–1.00)
GFR, Estimated: 38 mL/min — ABNORMAL LOW (ref >60)
Glucose, Bld: 128 mg/dL — ABNORMAL HIGH (ref 70–99)
Potassium: 4.3 mmol/L (ref 3.5–5.1)
Sodium: 141 mmol/L (ref 135–145)
Total Bilirubin: 0.4 mg/dL (ref 0.3–1.2)
Total Protein: 6.7 g/dL (ref 6.5–8.1)

## 2020-04-22 LAB — CBC WITH DIFFERENTIAL/PLATELET
Abs Immature Granulocytes: 0.01 10*3/uL (ref 0.00–0.07)
Basophils Absolute: 0 10*3/uL (ref 0.0–0.1)
Basophils Relative: 0 %
Eosinophils Absolute: 0.1 10*3/uL (ref 0.0–0.5)
Eosinophils Relative: 1 %
HCT: 33.1 % — ABNORMAL LOW (ref 36.0–46.0)
Hemoglobin: 11.2 g/dL — ABNORMAL LOW (ref 12.0–15.0)
Immature Granulocytes: 0 %
Lymphocytes Relative: 28 %
Lymphs Abs: 2.1 10*3/uL (ref 0.7–4.0)
MCH: 27.2 pg (ref 26.0–34.0)
MCHC: 33.8 g/dL (ref 30.0–36.0)
MCV: 80.3 fL (ref 80.0–100.0)
Monocytes Absolute: 0.4 10*3/uL (ref 0.1–1.0)
Monocytes Relative: 5 %
Neutro Abs: 5.1 10*3/uL (ref 1.7–7.7)
Neutrophils Relative %: 66 %
Platelets: 303 10*3/uL (ref 150–400)
RBC: 4.12 MIL/uL (ref 3.87–5.11)
RDW: 15.6 % — ABNORMAL HIGH (ref 11.5–15.5)
WBC: 7.7 10*3/uL (ref 4.0–10.5)
nRBC: 0 % (ref 0.0–0.2)

## 2020-04-22 LAB — RESP PANEL BY RT-PCR (FLU A&B, COVID) ARPGX2
Influenza A by PCR: NEGATIVE
Influenza B by PCR: NEGATIVE
SARS Coronavirus 2 by RT PCR: NEGATIVE

## 2020-04-22 LAB — URINALYSIS, MICROSCOPIC (REFLEX)

## 2020-04-22 LAB — SALICYLATE LEVEL: Salicylate Lvl: <7 mg/dL — ABNORMAL LOW (ref 7.0–30.0)

## 2020-04-22 LAB — ETHANOL: Alcohol, Ethyl (B): <10 mg/dL (ref <10)

## 2020-04-22 LAB — ACETAMINOPHEN LEVEL: Acetaminophen (Tylenol), Serum: <10 ug/mL — ABNORMAL LOW (ref 10–30)

## 2020-04-22 MED ORDER — OLANZAPINE 5 MG PO TABS
10.0000 mg | ORAL_TABLET | Freq: Every day | ORAL | Status: DC
  Administered 2020-04-22 – 2020-04-27 (×7): 10 mg via ORAL
  Filled 2020-04-22 (×2): qty 1
  Filled 2020-04-22: qty 2
  Filled 2020-04-22: qty 1
  Filled 2020-04-22: qty 2
  Filled 2020-04-22 (×3): qty 1
  Filled 2020-04-22: qty 2

## 2020-04-22 MED ORDER — LORAZEPAM 2 MG/ML IJ SOLN
1.0000 mg | Freq: Once | INTRAMUSCULAR | Status: AC
  Administered 2020-04-22: 1 mg via INTRAMUSCULAR
  Filled 2020-04-22: qty 1

## 2020-04-22 MED ORDER — CEPHALEXIN 250 MG PO CAPS: 500.0000 mg | ORAL_CAPSULE | Freq: Once | ORAL | Status: DC

## 2020-04-22 MED ORDER — CEPHALEXIN 250 MG PO CAPS
500.0000 mg | ORAL_CAPSULE | Freq: Two times a day (BID) | ORAL | Status: AC
  Administered 2020-04-22 – 2020-04-26 (×10): 500 mg via ORAL
  Filled 2020-04-22 (×10): qty 2

## 2020-04-22 NOTE — ED Notes (Signed)
Updated pt's son on status

## 2020-04-22 NOTE — ED Notes (Signed)
Pt ambulated to the bathroom with assistance 

## 2020-04-22 NOTE — Progress Notes (Signed)
[facility] Room H013C Civil engineer, contracting [facility]) Hospital Liaison RN note:  This patient is currently followed by out patient based palliative care with [employer]. Teresita Maxey, TOC is aware. [facility] Liaison will follow for disposition. Plan is to discharge to geriatric psych hospital.  Thank you.  Cyndra Numbers, RN [facility] Liaison [phone]

## 2020-04-22 NOTE — Discharge Planning (Signed)
Disposition Licensed Clinical Social Worker is seeking post-discharge placement for this patient at the following level of care: gero-psych.
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
## 2020-04-22 NOTE — ED Notes (Signed)
IVC paperwork faxed 

## 2020-04-22 NOTE — Progress Notes (Signed)
Pt meets inpatient criteria per Nira Conn, FNP. Referral information has been sent to the following geriatric psychiatric hospitals for review:  [facility]  [facility]  [facility]  [facility]  [facility]  [facility]  [facility]  [facility]  [facility]     Disposition will continue to assist with inpatient psych placement needs.    Wells Guiles, MSW, LCSW, LCAS Clinical Social Worker II Disposition CSW [phone]

## 2020-04-22 NOTE — ED Notes (Signed)
Breakfast Ordered 

## 2020-04-23 LAB — URINE CULTURE

## 2020-04-23 MED ORDER — LORAZEPAM 1 MG PO TABS
1.0000 mg | ORAL_TABLET | Freq: Once | ORAL | Status: AC
  Administered 2020-04-23: 1 mg via ORAL
  Filled 2020-04-23: qty 1

## 2020-04-23 NOTE — ED Notes (Signed)
Dinner Tray Ordered @ 1710. 

## 2020-04-23 NOTE — ED Notes (Signed)
Spoke w/pt's son per pt okay.

## 2020-04-23 NOTE — ED Notes (Signed)
Lunch Tray Ordered @ 1030. 

## 2020-04-23 NOTE — ED Notes (Signed)
Pt to agitated to obtain vitals at this time. Will try again at a later time. Sitter remains at patient bedside.

## 2020-04-23 NOTE — ED Provider Notes (Signed)
  Physical Exam  BP 140/64 (BP Location: Left Arm)   Pulse 90   Temp 98.3 F (36.8 C) (Oral)   Resp 16   SpO2 99%   Physical Exam  ED Course/Procedures   Clinical Course as of 04/23/20 0909  Thu Apr 22, 2020  0138 Creatinine(!): 1.39 Slightly elevated [HM]  0138 Hemoglobin(!): 11.2 Mild anemia noted [HM]  0154 Leukocytes,Ua(!): TRACE Concern for possible UTI with increased AMS - keflex started. [HM]    Clinical Course User Index [HM] Muthersbaugh, Boyd Kerbs    Procedures  MDM  Patient pending [facility] psych placement. UTI. Has not slept much last night despite getting Zyprexa. Will give oral Ativan now      Benjiman Core, MD 04/23/20 [phone]

## 2020-04-23 NOTE — ED Notes (Signed)
Breakfast ordered 

## 2020-04-23 NOTE — Progress Notes (Signed)
[facility] ([facility]) Community Based Palliative Care       This patient is enrolled in our palliative care services in the community.  [facility] will continue to follow for any discharge planning needs and to coordinate continuation of palliative care.       Thank you for the opportunity to participate in this patient's care.     Chrislyn King, BSN, RN [facility] Hospital Liaison   [phone] (24 h on call)  
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
## 2020-04-24 MED ORDER — LORAZEPAM 2 MG/ML IJ SOLN
0.5000 mg | Freq: Once | INTRAMUSCULAR | Status: AC
  Administered 2020-04-24: 0.5 mg via INTRAVENOUS
  Filled 2020-04-24: qty 1

## 2020-04-24 MED ORDER — IBUPROFEN 400 MG PO TABS
600.0000 mg | ORAL_TABLET | Freq: Once | ORAL | Status: DC
  Filled 2020-04-24: qty 1

## 2020-04-24 MED ORDER — LORAZEPAM 1 MG PO TABS
1.0000 mg | ORAL_TABLET | Freq: Once | ORAL | Status: AC
  Administered 2020-04-25: 1 mg via ORAL
  Filled 2020-04-24 (×3): qty 1

## 2020-04-24 NOTE — Progress Notes (Signed)
Per request from [facility] admissions), CSW faxed (via epic) EKG and chest xray results for pt. IVC paperwork will be faxed if pt is accepted to this facility. Ashely did not receive--resent through Epic, as the two fax machines at [facility] are not functioning today.   Fredericka Bottcher S. Alan Ripper, MSW, LCSW Clinical Social Worker 04/24/2020 4:59 PM

## 2020-04-24 NOTE — Progress Notes (Signed)
Pt is in review at [facility]. Morrie Sheldon in admission requested that pt's most recent chest xray and EKG be sent for review. CSW faxed requested information through Epic. If pt is approved for admission, she will need another COVID test prior to arrival, in addition to receipt of IVC paperwork.  Disposition CSW continuing to assess.    Mikhai Bienvenue S. Alan Ripper, MSW, LCSW Clinical Social Worker 04/24/2020 12:41 PM
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
## 2020-04-24 NOTE — Care Management (Addendum)
ED RNCM reviewed record, patient still awaiting geri-psych placement.   Michel Bickers RN, BSN  ED Care Manager [phone]
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
## 2020-04-24 NOTE — ED Notes (Signed)
Dr. Freida Busman spoke with patient, patient agreed to take medication. Keflex given.

## 2020-04-25 ENCOUNTER — Encounter (HOSPITAL_COMMUNITY): Payer: Self-pay

## 2020-04-25 LAB — SARS CORONAVIRUS 2 (TAT 6-24 HRS): SARS Coronavirus 2: NEGATIVE

## 2020-04-25 MED ORDER — LORAZEPAM 2 MG/ML IJ SOLN
0.5000 mg | Freq: Once | INTRAMUSCULAR | Status: AC
  Administered 2020-04-25: 0.5 mg via INTRAMUSCULAR
  Filled 2020-04-25: qty 1

## 2020-04-25 NOTE — Progress Notes (Signed)
Writer notes per Ashley, [facility] is still reviewing Pt.  Per Ashley  Confirms  EKG and chest X-ray was received via fax. Ashley requested for IVC papers to be faxed. Nursing staff has been notified to fax IVC papers. Writer asked about bed availability, Ashley noted that she is unsure that beds will be available today, however she will check and call back.     

## 2020-04-25 NOTE — ED Notes (Signed)
Called staffing, no sitter available for 0700 at this time.

## 2020-04-25 NOTE — ED Notes (Signed)
Lorazepam (Ativan) injection 0.5mg  given by previous shift Alana, RN at [phone] and not charted. Verified medication administration via phone call.

## 2020-04-25 NOTE — ED Notes (Signed)
Breakfast Ordered 

## 2020-04-25 NOTE — ED Provider Notes (Signed)
Emergency Medicine Observation Re-evaluation Note  Kimberly Gregory is a 85 y.o. female, seen on rounds today.  Pt initially presented to the ED for complaints of Dementia and Psychiatric Evaluation Currently, the patient is awaiting geriatric psychiatric placement.  Physical Exam  BP (!) 143/123 Comment: pt was agitated and moving  Pulse 84   Temp 97.9 F (36.6 C) (Oral)   Resp 18   SpO2 95%  Physical Exam General: Elderly female sitting on gurney does not appear to be in acute distress Cardiac: Regular rate and rhythm Lungs: Clear to auscultation Psych: Resting comfortably  ED Course / MDM  EKG:EKG Interpretation  Date/Time:  Thursday April 22 2020 00:33:57 EST Ventricular Rate:  81 PR Interval:  140 QRS Duration: 90 QT Interval:  370 QTC Calculation: 429 R Axis:   -40 Text Interpretation: Sinus rhythm with occasional Premature ventricular complexes and Fusion complexes Left axis deviation Cannot rule out Anterior infarct , age undetermined Abnormal ECG When compared with ECG of 10/21/2016, Premature ventricular complexes are now present Confirmed by Dione Booze ([id number]) on 04/22/2020 5:17:26 AM  Clinical Course as of 04/25/20 1112  Thu Apr 22, 2020  0138 Creatinine(!): 1.39 Slightly elevated [HM]  0138 Hemoglobin(!): 11.2 Mild anemia noted [HM]  0154 Leukocytes,Ua(!): TRACE Concern for possible UTI with increased AMS - keflex started. [HM]    Clinical Course User Index [HM] Muthersbaugh, Boyd Kerbs   I have reviewed the labs performed to date as well as medications administered while in observation.  Recent changes in the last 24 hours include return of urine culture with mixed flora consistent with no infection.  Will DC antibiotic  Plan  Current plan is for geriatric psychiatric admission. Patient is under full IVC at this time.   Margarita Grizzle, MD 04/25/20 1116

## 2020-04-25 NOTE — ED Notes (Signed)
Pt came out of room and began pushing the sitter trying to leave. Distress button pushed to obtain extra help with redirecting pt

## 2020-04-25 NOTE — BH Assessment (Signed)
Brooke, NP, continue inpt tx. Morrie Sheldon, RN at [facility], states that patient tentatively accepted, pending IVC copiees are received. Patient's nurse Lenis Dickinson, RN) provided updates in regards to the IVC papers. The complete IVC paperwork (Front and Back) of the Custody Order/Affidavit is not in patient's chart., only [employer]. Therefore, Dagoberto Reef of Court will have to contacted in the am by patients nurse/nursing secretary to obtain the displaced documents and fax to [facility] for patient's acceptance. Disposition LCSW to follow up with patient's nurse in the am to inquire about IVC paper status. Also, contact [facility] to inquire about patient's acceptance.

## 2020-04-25 NOTE — ED Notes (Signed)
I updated pt's son on pt's status

## 2020-04-25 NOTE — ED Notes (Signed)
Pt sitting at bedside eating breakfast. I set pt's tray up for her and opened everything.

## 2020-04-25 NOTE — BHH Counselor (Signed)
Reassessment  04/25/2020: TTS reassess patient. Patient presented confused and unable to pass mental health status. Patient unable to answer TTS questions appropriately responded in word salad and tangential language. Patient repeated over and over she needs to go to her dad's house.   Disposition: Brooke, NP, continue inpt tx

## 2020-04-26 MED ORDER — DIPHENHYDRAMINE HCL 25 MG PO CAPS
25.0000 mg | ORAL_CAPSULE | Freq: Once | ORAL | Status: AC
  Administered 2020-04-26: 25 mg via ORAL
  Filled 2020-04-26: qty 1

## 2020-04-26 MED ORDER — OLANZAPINE 5 MG PO TBDP
5.0000 mg | ORAL_TABLET | Freq: Two times a day (BID) | ORAL | Status: DC | PRN
  Administered 2020-04-26 – 2020-04-28 (×2): 5 mg via ORAL
  Filled 2020-04-26 (×2): qty 1

## 2020-04-26 MED ORDER — TRAZODONE HCL 50 MG PO TABS
50.0000 mg | ORAL_TABLET | Freq: Every evening | ORAL | Status: DC | PRN
  Administered 2020-04-26 – 2020-04-27 (×2): 50 mg via ORAL
  Filled 2020-04-26 (×2): qty 1

## 2020-04-26 MED ORDER — DIVALPROEX SODIUM 250 MG PO DR TAB
250.0000 mg | DELAYED_RELEASE_TABLET | Freq: Two times a day (BID) | ORAL | Status: DC
  Administered 2020-04-26 – 2020-04-28 (×4): 250 mg via ORAL
  Filled 2020-04-26 (×4): qty 1

## 2020-04-26 MED ORDER — HYDROXYZINE HCL 25 MG PO TABS
25.0000 mg | ORAL_TABLET | Freq: Three times a day (TID) | ORAL | Status: DC | PRN
  Administered 2020-04-26 (×2): 25 mg via ORAL
  Filled 2020-04-26 (×3): qty 1

## 2020-04-26 NOTE — ED Notes (Signed)
Pt is becoming more agitated. Will not stay in her room, walking around and slamming her door. Asked pt to return to room. NA reorienting pt to surroundings.

## 2020-04-26 NOTE — ED Provider Notes (Signed)
Emergency Medicine Observation Re-evaluation Note  Kimberly Gregory is a 85 y.o. female, seen on rounds today.  Pt initially presented to the ED for complaints of Dementia and Psychiatric Evaluation Currently, the patient is resting comfortably.  Physical Exam  BP 105/78 (BP Location: Left Arm)   Pulse 88   Temp 97.9 F (36.6 C) (Oral)   Resp 17   SpO2 98%  Physical Exam General: No distress Cardiac: Normal heart rate Lungs: Normal respiratory rate Psych: Calm  ED Course / MDM  EKG:EKG Interpretation  Date/Time:  Thursday April 22 2020 00:33:57 EST Ventricular Rate:  81 PR Interval:  140 QRS Duration: 90 QT Interval:  370 QTC Calculation: 429 R Axis:   -40 Text Interpretation: Sinus rhythm with occasional Premature ventricular complexes and Fusion complexes Left axis deviation Cannot rule out Anterior infarct , age undetermined Abnormal ECG When compared with ECG of 10/21/2016, Premature ventricular complexes are now present Confirmed by Dione Booze ([id number]) on 04/22/2020 5:17:26 AM  Clinical Course as of 04/26/20 0830  Thu Apr 22, 2020  0138 Creatinine(!): 1.39 Slightly elevated [HM]  0138 Hemoglobin(!): 11.2 Mild anemia noted [HM]  0154 Leukocytes,Ua(!): TRACE Concern for possible UTI with increased AMS - keflex started. [HM]    Clinical Course User Index [HM] Muthersbaugh, Boyd Kerbs   I have reviewed the labs performed to date as well as medications administered while in observation.  Recent changes in the last 24 hours include none.  Plan  Current plan is for complete treatment for UTI.  Culture did not indicate infection.  Anticipate Geri psychiatric placement. Patient is under full IVC at this time.   Mancel Bale, MD 04/26/20 [phone]

## 2020-04-26 NOTE — Progress Notes (Signed)
CSW spoke with Kimberly Gregory [phone]) in admissions at [facility]. He stated that pt's assigned room is being converted from a provider's office to a pt room and to call back at 3pm today, when additional staff will be able to provide more information. Kimberly Gregory believes the room renovation should be completed today.    Disposition will continue to follow.    Wells Guiles, MSW, LCSW, LCAS Clinical Social Worker II Disposition CSW [phone]

## 2020-04-26 NOTE — ED Notes (Signed)
Dinner Tray Ordered @ 1749. 

## 2020-04-26 NOTE — ED Notes (Signed)
Vital signs not obtained at this time due to patient sleeping.

## 2020-04-26 NOTE — BH Assessment (Signed)
Patient was seen this date to assess current mental health status. Patient contnues to be disorganized and cannot participate in the assessment. Per notes patient contnues to be agitated  And attempting to assault staff at times. Patient contnues to meet inpatient criteria as placement is investigated.   CSW spoke with Kimberly Gregory [phone]) in admissions at [facility]. He stated that pt's assigned room is being converted from a provider's office to a pt room and to call back at 3pm today, when additional staff will be able to provide more information. Kimberly Gregory believes the room renovation should be completed today.

## 2020-04-26 NOTE — ED Notes (Signed)
Graham Crackers and Peanut butter placed at bedside. Patient was given a Coke.

## 2020-04-26 NOTE — Progress Notes (Addendum)
[facility] ([facility]) Community Based Palliative Care       This patient is enrolled in our palliative care services in the community.  [facility] will continue to follow for any discharge planning needs and to coordinate continuation of palliative care.       Thank you for the opportunity to participate in this patient's care.     Chrislyn King, BSN, RN [facility] Hospital Liaison   [phone] (24 h on call)  

## 2020-04-26 NOTE — ED Notes (Signed)
Pt continues to have outbursts of yelling at staff, yelling curse words, and attempting to leave.  She is uncooperative and not easily re-directed. Pt taking off clothes and saying they are not hers.  Pt encouraged to put clothes back on and stay in room. Sitter outside of room.  ED MD, SW and psych NP notified.

## 2020-04-26 NOTE — ED Notes (Signed)
Pt resting with eyes closed and in NAD. Resp even and unlabored. Sitter at bedside.

## 2020-04-27 LAB — POC SARS CORONAVIRUS 2 AG -  ED: SARS Coronavirus 2 Ag: NEGATIVE

## 2020-04-27 NOTE — ED Notes (Signed)
Pt resting quietly with eyes closed. Resp even and non-labored. Will continue to monitor.  

## 2020-04-27 NOTE — ED Notes (Signed)
Faxed paperwork requested to [facility].

## 2020-04-27 NOTE — ED Notes (Signed)
Pt continues to come out of her room and try to leave. Pt redirected to her room. Pt tried to pull away and go the other way. Eventually able to talk pt into going back to her room. Will continue to monitor.

## 2020-04-27 NOTE — ED Notes (Signed)
Pt out of room again. Will not stay in room. Pt continues to think this is her house and we do not belong here. NA reoriented pt and redirected pt to her room. Will continue to monitor pt. Safety maintained.

## 2020-04-27 NOTE — ED Notes (Signed)
Pt out to go to restroom. States that she is ready to go home. Asked pt to try to get a little sleep and that we would talk about that in a few hours when she woke up. Pt returned to her room without a problem.

## 2020-04-27 NOTE — Progress Notes (Addendum)
CSW spoke with Rondie in admissions at [facility]. She is unsure if pt's room has been completed, but will call back as soon as possible with more information. She also asks that pt's IVC (front and back of pages) be faxed again to [phone]   Wells Guiles, MSW, LCSW, LCAS Clinical Social Worker II Disposition CSW [phone]   UPDATE: Manon Hilding at [facility] states that acceptance information will be given after receiving the 2nd page of the Findings and Custody order and the 2nd page of the Affidavit (IVC). Pt will also need a new Covid test. Lanora Manis @ [facility] ED notified.   UPDATE: CSW left voice message with Lynden Ang at the [place] [employer] requesting a return phone call.

## 2020-04-27 NOTE — ED Notes (Signed)
Pt resting quietly with eyes closed. Resp even and non-labored. Will continue to monitor. Will check VS once pt wakes up.

## 2020-04-27 NOTE — ED Notes (Signed)
Pt out of room again. Back and forth to the restroom. Pt never uses the restroom. Trying to leave purple zone. Redirected pt to her room and suggested to pt that she try to get some sleep. Will continue to monitor.

## 2020-04-27 NOTE — Progress Notes (Addendum)
Covid results and 2 pages of Findings and custody and Affidavit faxed to [place] at [facility] [phone])   Wells Guiles, MSW, LCSW, LCAS Clinical Social Worker II Disposition CSW [phone]   UPDATE: Per Rondie at [facility], the forms sent by the [facility] of Courts is not what is needed. They have requested a new IVC be completed at this point. RN at [facility] notified. Completed IVC paperwork should be faxed to [facility] at [phone]

## 2020-04-27 NOTE — ED Notes (Signed)
Pt out of her room again. Walking around the unit. States that she needs to go home. Agitation increasing. Redirected pt.

## 2020-04-27 NOTE — ED Notes (Addendum)
IVC with back pages from magistrate and a new first exam done by EDP faxed to [facility] at [phone]. The IVC paperwork is valid until 1930 on 04/28/20. One copy placed in Med Rec and one in red folder.

## 2020-04-27 NOTE — ED Notes (Signed)
Pt out of room. Will not remain in room. Trying to leave unit. NA redirected pt back to room safely.

## 2020-04-27 NOTE — ED Notes (Signed)
Pt continues to raise voice and curse. Pt trying to leave. "I need to piss". "Leave me alone". Pt redirected to room. Spoke with pt for a little while about whatever she wanted to talk about. Pt content for a few minutes. Steady gait to and from restroom. While in restroom, pt does nothing but stand there. Staff have to ask pt to come out of restroom and return to her room.

## 2020-04-27 NOTE — ED Notes (Signed)
Patient resting at this time. Will assess patient vital signs upon waking. Night time nurses stated patient did not sleep last night. Allowing patient time to rest.

## 2020-04-27 NOTE — ED Notes (Signed)
Pt continues to pace the unit trying to leave, "find her purse", "take care of the babies", and go into other pts rooms. Reoriented pt to surroundings.  Pt states "My husband will shoot you all".

## 2020-04-28 NOTE — ED Provider Notes (Signed)
Patient request for EMTALA to be filed as patient has been placed as [facility] facility for further managements of her dementia state.  At this time patient is resting comfortably and is stable for transfer.  Accepting provider is Dr. Joseph Art.    Fayrene Helper, PA-C 04/28/20 1044    Pollyann Savoy, MD 04/28/20 1539

## 2020-04-28 NOTE — ED Notes (Addendum)
Help patient with her breakfast tray got patient a new top on patient is sitting up bedside the bed eating breakfast with sitter at bedside

## 2020-04-28 NOTE — ED Notes (Signed)
PT took pills without difficulty.

## 2020-04-28 NOTE — ED Notes (Signed)
IVC/Violent/Flight Risk Breakfast order placed

## 2020-04-28 NOTE — Progress Notes (Signed)
Pt accepted to [facility]    Dr Joseph Art is the accepting/attending provider   Call report to [phone]  Brenda @ [facility] ED notified.     Pt is involuntary and will be transported by law enforcement.   Pt may arrive to [facility] as soon as transportation is arranged.    Wells Guiles, MSW, LCSW, LCAS Clinical Social Worker II Disposition CSW [phone]

## 2020-04-28 NOTE — ED Notes (Signed)
Called Sheriff's office.  They won't be available to pick pt up until after noon.

## 2020-04-28 NOTE — ED Notes (Signed)
IVC/Violent/Flight Risk

## 2020-04-28 NOTE — ED Notes (Signed)
Attemtped to call report to [facility].  They will not take report until 1 hour before transport.

## 2020-04-29 DIAGNOSIS — I10 Essential (primary) hypertension: Secondary | ICD-10-CM | POA: Diagnosis not present

## 2020-04-29 DIAGNOSIS — S31809A Unspecified open wound of unspecified buttock, initial encounter: Secondary | ICD-10-CM | POA: Diagnosis not present

## 2020-04-29 DIAGNOSIS — N1832 Chronic kidney disease, stage 3b: Secondary | ICD-10-CM | POA: Diagnosis not present

## 2020-05-01 DIAGNOSIS — N1832 Chronic kidney disease, stage 3b: Secondary | ICD-10-CM | POA: Diagnosis not present

## 2020-05-01 DIAGNOSIS — I10 Essential (primary) hypertension: Secondary | ICD-10-CM | POA: Diagnosis not present

## 2020-05-01 DIAGNOSIS — S31809A Unspecified open wound of unspecified buttock, initial encounter: Secondary | ICD-10-CM | POA: Diagnosis not present

## 2020-05-03 DIAGNOSIS — I10 Essential (primary) hypertension: Secondary | ICD-10-CM | POA: Diagnosis not present

## 2020-05-03 DIAGNOSIS — S31809D Unspecified open wound of unspecified buttock, subsequent encounter: Secondary | ICD-10-CM | POA: Diagnosis not present

## 2020-05-03 DIAGNOSIS — N1832 Chronic kidney disease, stage 3b: Secondary | ICD-10-CM | POA: Diagnosis not present

## 2020-05-04 DIAGNOSIS — S31809A Unspecified open wound of unspecified buttock, initial encounter: Secondary | ICD-10-CM | POA: Diagnosis not present

## 2020-05-05 DIAGNOSIS — N1832 Chronic kidney disease, stage 3b: Secondary | ICD-10-CM | POA: Diagnosis not present

## 2020-05-05 DIAGNOSIS — I498 Other specified cardiac arrhythmias: Secondary | ICD-10-CM | POA: Diagnosis not present

## 2020-05-05 DIAGNOSIS — R001 Bradycardia, unspecified: Secondary | ICD-10-CM | POA: Diagnosis not present

## 2020-05-05 DIAGNOSIS — I493 Ventricular premature depolarization: Secondary | ICD-10-CM | POA: Diagnosis not present

## 2020-05-05 DIAGNOSIS — I10 Essential (primary) hypertension: Secondary | ICD-10-CM | POA: Diagnosis not present

## 2020-05-05 DIAGNOSIS — S31809D Unspecified open wound of unspecified buttock, subsequent encounter: Secondary | ICD-10-CM | POA: Diagnosis not present

## 2020-05-05 DIAGNOSIS — S31809A Unspecified open wound of unspecified buttock, initial encounter: Secondary | ICD-10-CM | POA: Diagnosis not present

## 2020-05-06 DIAGNOSIS — Z7189 Other specified counseling: Secondary | ICD-10-CM | POA: Diagnosis not present

## 2020-05-06 DIAGNOSIS — Z515 Encounter for palliative care: Secondary | ICD-10-CM | POA: Diagnosis not present

## 2020-05-07 DIAGNOSIS — I10 Essential (primary) hypertension: Secondary | ICD-10-CM | POA: Diagnosis not present

## 2020-05-07 DIAGNOSIS — N1832 Chronic kidney disease, stage 3b: Secondary | ICD-10-CM | POA: Diagnosis not present

## 2020-05-07 DIAGNOSIS — S31809D Unspecified open wound of unspecified buttock, subsequent encounter: Secondary | ICD-10-CM | POA: Diagnosis not present

## 2020-05-07 DIAGNOSIS — R001 Bradycardia, unspecified: Secondary | ICD-10-CM | POA: Diagnosis not present

## 2020-05-08 DIAGNOSIS — S31809A Unspecified open wound of unspecified buttock, initial encounter: Secondary | ICD-10-CM | POA: Diagnosis not present

## 2020-05-09 DIAGNOSIS — N1832 Chronic kidney disease, stage 3b: Secondary | ICD-10-CM | POA: Diagnosis not present

## 2020-05-09 DIAGNOSIS — I10 Essential (primary) hypertension: Secondary | ICD-10-CM | POA: Diagnosis not present

## 2020-05-09 DIAGNOSIS — S31809A Unspecified open wound of unspecified buttock, initial encounter: Secondary | ICD-10-CM | POA: Diagnosis not present

## 2020-05-10 DIAGNOSIS — Z515 Encounter for palliative care: Secondary | ICD-10-CM | POA: Diagnosis not present

## 2020-05-11 DIAGNOSIS — N1832 Chronic kidney disease, stage 3b: Secondary | ICD-10-CM | POA: Diagnosis not present

## 2020-05-11 DIAGNOSIS — S31809A Unspecified open wound of unspecified buttock, initial encounter: Secondary | ICD-10-CM | POA: Diagnosis not present

## 2020-05-11 DIAGNOSIS — I10 Essential (primary) hypertension: Secondary | ICD-10-CM | POA: Diagnosis not present

## 2020-05-12 DIAGNOSIS — M25761 Osteophyte, right knee: Secondary | ICD-10-CM | POA: Diagnosis not present

## 2020-05-12 DIAGNOSIS — M1711 Unilateral primary osteoarthritis, right knee: Secondary | ICD-10-CM | POA: Diagnosis not present

## 2020-05-13 DIAGNOSIS — S31809A Unspecified open wound of unspecified buttock, initial encounter: Secondary | ICD-10-CM | POA: Diagnosis not present

## 2020-05-13 DIAGNOSIS — N1832 Chronic kidney disease, stage 3b: Secondary | ICD-10-CM | POA: Diagnosis not present

## 2020-05-13 DIAGNOSIS — I10 Essential (primary) hypertension: Secondary | ICD-10-CM | POA: Diagnosis not present

## 2020-05-17 DIAGNOSIS — S31809D Unspecified open wound of unspecified buttock, subsequent encounter: Secondary | ICD-10-CM | POA: Diagnosis not present

## 2020-05-17 DIAGNOSIS — I10 Essential (primary) hypertension: Secondary | ICD-10-CM | POA: Diagnosis not present

## 2020-05-17 DIAGNOSIS — R001 Bradycardia, unspecified: Secondary | ICD-10-CM | POA: Diagnosis not present

## 2020-05-17 DIAGNOSIS — N1832 Chronic kidney disease, stage 3b: Secondary | ICD-10-CM | POA: Diagnosis not present

## 2020-05-19 DIAGNOSIS — G4700 Insomnia, unspecified: Secondary | ICD-10-CM | POA: Diagnosis not present

## 2020-05-19 DIAGNOSIS — K5900 Constipation, unspecified: Secondary | ICD-10-CM | POA: Diagnosis not present

## 2020-05-19 DIAGNOSIS — Z515 Encounter for palliative care: Secondary | ICD-10-CM | POA: Diagnosis not present

## 2020-05-19 DIAGNOSIS — I10 Essential (primary) hypertension: Secondary | ICD-10-CM | POA: Diagnosis not present

## 2020-05-19 DIAGNOSIS — N1832 Chronic kidney disease, stage 3b: Secondary | ICD-10-CM | POA: Diagnosis not present

## 2020-05-19 DIAGNOSIS — S31809D Unspecified open wound of unspecified buttock, subsequent encounter: Secondary | ICD-10-CM | POA: Diagnosis not present

## 2020-05-19 DIAGNOSIS — R001 Bradycardia, unspecified: Secondary | ICD-10-CM | POA: Diagnosis not present

## 2020-05-19 DIAGNOSIS — D509 Iron deficiency anemia, unspecified: Secondary | ICD-10-CM | POA: Diagnosis not present

## 2020-05-21 DIAGNOSIS — R001 Bradycardia, unspecified: Secondary | ICD-10-CM | POA: Diagnosis not present

## 2020-05-21 DIAGNOSIS — I10 Essential (primary) hypertension: Secondary | ICD-10-CM | POA: Diagnosis not present

## 2020-05-21 DIAGNOSIS — N1832 Chronic kidney disease, stage 3b: Secondary | ICD-10-CM | POA: Diagnosis not present

## 2020-05-21 DIAGNOSIS — D509 Iron deficiency anemia, unspecified: Secondary | ICD-10-CM | POA: Diagnosis not present

## 2020-05-23 DIAGNOSIS — S31809D Unspecified open wound of unspecified buttock, subsequent encounter: Secondary | ICD-10-CM | POA: Diagnosis not present

## 2020-05-23 DIAGNOSIS — R001 Bradycardia, unspecified: Secondary | ICD-10-CM | POA: Diagnosis not present

## 2020-05-25 DIAGNOSIS — Z23 Encounter for immunization: Secondary | ICD-10-CM | POA: Diagnosis not present

## 2020-05-26 DIAGNOSIS — R2243 Localized swelling, mass and lump, lower limb, bilateral: Secondary | ICD-10-CM | POA: Diagnosis not present

## 2020-05-26 DIAGNOSIS — I10 Essential (primary) hypertension: Secondary | ICD-10-CM | POA: Diagnosis not present

## 2020-05-26 DIAGNOSIS — N189 Chronic kidney disease, unspecified: Secondary | ICD-10-CM | POA: Diagnosis not present

## 2020-05-26 DIAGNOSIS — I499 Cardiac arrhythmia, unspecified: Secondary | ICD-10-CM | POA: Diagnosis not present

## 2020-05-27 ENCOUNTER — Encounter: Payer: Medicare Other | Admitting: Adult Health

## 2020-07-20 DIAGNOSIS — N189 Chronic kidney disease, unspecified: Secondary | ICD-10-CM | POA: Diagnosis not present

## 2020-07-20 DIAGNOSIS — I7091 Generalized atherosclerosis: Secondary | ICD-10-CM | POA: Diagnosis not present

## 2020-07-20 DIAGNOSIS — L603 Nail dystrophy: Secondary | ICD-10-CM | POA: Diagnosis not present

## 2020-07-20 DIAGNOSIS — M2041 Other hammer toe(s) (acquired), right foot: Secondary | ICD-10-CM | POA: Diagnosis not present

## 2020-07-20 DIAGNOSIS — B351 Tinea unguium: Secondary | ICD-10-CM | POA: Diagnosis not present

## 2020-07-20 DIAGNOSIS — M2042 Other hammer toe(s) (acquired), left foot: Secondary | ICD-10-CM | POA: Diagnosis not present

## 2020-08-26 DIAGNOSIS — I10 Essential (primary) hypertension: Secondary | ICD-10-CM | POA: Diagnosis not present

## 2020-08-26 DIAGNOSIS — N189 Chronic kidney disease, unspecified: Secondary | ICD-10-CM | POA: Diagnosis not present

## 2020-08-26 DIAGNOSIS — E119 Type 2 diabetes mellitus without complications: Secondary | ICD-10-CM | POA: Diagnosis not present

## 2020-08-26 DIAGNOSIS — E559 Vitamin D deficiency, unspecified: Secondary | ICD-10-CM | POA: Diagnosis not present

## 2020-09-08 ENCOUNTER — Inpatient Hospital Stay (HOSPITAL_BASED_OUTPATIENT_CLINIC_OR_DEPARTMENT_OTHER)
Admission: EM | Admit: 2020-09-08 | Discharge: 2020-09-12 | DRG: 872 | Disposition: A | Discharge status: Skilled Nursing Facility | Payer: Medicare Other | Source: Skilled Nursing Facility | Attending: Internal Medicine | Admitting: Internal Medicine

## 2020-09-08 ENCOUNTER — Encounter (HOSPITAL_BASED_OUTPATIENT_CLINIC_OR_DEPARTMENT_OTHER): Payer: Self-pay | Admitting: Obstetrics and Gynecology

## 2020-09-08 ENCOUNTER — Other Ambulatory Visit: Payer: Self-pay

## 2020-09-08 ENCOUNTER — Emergency Department (HOSPITAL_BASED_OUTPATIENT_CLINIC_OR_DEPARTMENT_OTHER): Payer: Medicare Other

## 2020-09-08 DIAGNOSIS — N3001 Acute cystitis with hematuria: Secondary | ICD-10-CM | POA: Diagnosis not present

## 2020-09-08 DIAGNOSIS — R319 Hematuria, unspecified: Secondary | ICD-10-CM | POA: Diagnosis not present

## 2020-09-08 DIAGNOSIS — R6520 Severe sepsis without septic shock: Secondary | ICD-10-CM | POA: Diagnosis present

## 2020-09-08 DIAGNOSIS — F0391 Unspecified dementia with behavioral disturbance: Secondary | ICD-10-CM | POA: Diagnosis present

## 2020-09-08 DIAGNOSIS — D631 Anemia in chronic kidney disease: Secondary | ICD-10-CM | POA: Diagnosis not present

## 2020-09-08 DIAGNOSIS — I129 Hypertensive chronic kidney disease with stage 1 through stage 4 chronic kidney disease, or unspecified chronic kidney disease: Secondary | ICD-10-CM | POA: Diagnosis present

## 2020-09-08 DIAGNOSIS — I444 Left anterior fascicular block: Secondary | ICD-10-CM | POA: Diagnosis present

## 2020-09-08 DIAGNOSIS — R1110 Vomiting, unspecified: Secondary | ICD-10-CM | POA: Diagnosis not present

## 2020-09-08 DIAGNOSIS — N1831 Chronic kidney disease, stage 3a: Secondary | ICD-10-CM | POA: Diagnosis not present

## 2020-09-08 DIAGNOSIS — Z79899 Other long term (current) drug therapy: Secondary | ICD-10-CM

## 2020-09-08 DIAGNOSIS — I959 Hypotension, unspecified: Secondary | ICD-10-CM

## 2020-09-08 DIAGNOSIS — A419 Sepsis, unspecified organism: Principal | ICD-10-CM | POA: Diagnosis present

## 2020-09-08 DIAGNOSIS — D649 Anemia, unspecified: Secondary | ICD-10-CM | POA: Diagnosis not present

## 2020-09-08 DIAGNOSIS — F03918 Unspecified dementia, unspecified severity, with other behavioral disturbance: Secondary | ICD-10-CM | POA: Diagnosis present

## 2020-09-08 DIAGNOSIS — Z90710 Acquired absence of both cervix and uterus: Secondary | ICD-10-CM

## 2020-09-08 DIAGNOSIS — I951 Orthostatic hypotension: Secondary | ICD-10-CM | POA: Diagnosis not present

## 2020-09-08 DIAGNOSIS — K219 Gastro-esophageal reflux disease without esophagitis: Secondary | ICD-10-CM | POA: Diagnosis present

## 2020-09-08 DIAGNOSIS — R112 Nausea with vomiting, unspecified: Secondary | ICD-10-CM | POA: Diagnosis not present

## 2020-09-08 DIAGNOSIS — R6889 Other general symptoms and signs: Secondary | ICD-10-CM | POA: Diagnosis not present

## 2020-09-08 DIAGNOSIS — Z20822 Contact with and (suspected) exposure to covid-19: Secondary | ICD-10-CM | POA: Diagnosis not present

## 2020-09-08 DIAGNOSIS — Z9049 Acquired absence of other specified parts of digestive tract: Secondary | ICD-10-CM | POA: Diagnosis not present

## 2020-09-08 DIAGNOSIS — M1990 Unspecified osteoarthritis, unspecified site: Secondary | ICD-10-CM | POA: Diagnosis present

## 2020-09-08 DIAGNOSIS — J9811 Atelectasis: Secondary | ICD-10-CM | POA: Diagnosis not present

## 2020-09-08 DIAGNOSIS — Z87891 Personal history of nicotine dependence: Secondary | ICD-10-CM | POA: Diagnosis not present

## 2020-09-08 DIAGNOSIS — D508 Other iron deficiency anemias: Secondary | ICD-10-CM | POA: Diagnosis not present

## 2020-09-08 DIAGNOSIS — E7800 Pure hypercholesterolemia, unspecified: Secondary | ICD-10-CM | POA: Diagnosis present

## 2020-09-08 DIAGNOSIS — A4151 Sepsis due to Escherichia coli [E. coli]: Secondary | ICD-10-CM | POA: Diagnosis not present

## 2020-09-08 DIAGNOSIS — I499 Cardiac arrhythmia, unspecified: Secondary | ICD-10-CM | POA: Diagnosis not present

## 2020-09-08 DIAGNOSIS — N390 Urinary tract infection, site not specified: Secondary | ICD-10-CM

## 2020-09-08 DIAGNOSIS — Z743 Need for continuous supervision: Secondary | ICD-10-CM | POA: Diagnosis not present

## 2020-09-08 DIAGNOSIS — R110 Nausea: Secondary | ICD-10-CM | POA: Diagnosis not present

## 2020-09-08 DIAGNOSIS — B9620 Unspecified Escherichia coli [E. coli] as the cause of diseases classified elsewhere: Secondary | ICD-10-CM | POA: Diagnosis present

## 2020-09-08 DIAGNOSIS — N183 Chronic kidney disease, stage 3 unspecified: Secondary | ICD-10-CM

## 2020-09-08 DIAGNOSIS — R9431 Abnormal electrocardiogram [ECG] [EKG]: Secondary | ICD-10-CM | POA: Diagnosis not present

## 2020-09-08 LAB — URINALYSIS, ROUTINE W REFLEX MICROSCOPIC
Bilirubin Urine: NEGATIVE
Glucose, UA: NEGATIVE mg/dL
Ketones, ur: NEGATIVE mg/dL
Nitrite: POSITIVE — AB
Protein, ur: 30 mg/dL — AB
Specific Gravity, Urine: 1.018 (ref 1.005–1.030)
Trans Epithel, UA: 2
WBC, UA: >50 WBC/hpf — ABNORMAL HIGH (ref 0–5)
pH: 6.5 (ref 5.0–8.0)

## 2020-09-08 LAB — COMPREHENSIVE METABOLIC PANEL
ALT: 18 U/L (ref 0–44)
AST: 21 U/L (ref 15–41)
Albumin: 3.5 g/dL (ref 3.5–5.0)
Alkaline Phosphatase: 56 U/L (ref 38–126)
Anion gap: 7 (ref 5–15)
BUN: 29 mg/dL — ABNORMAL HIGH (ref 8–23)
CO2: 27 mmol/L (ref 22–32)
Calcium: 9.2 mg/dL (ref 8.9–10.3)
Chloride: 106 mmol/L (ref 98–111)
Creatinine, Ser: 1.14 mg/dL — ABNORMAL HIGH (ref 0.44–1.00)
GFR, Estimated: 48 mL/min — ABNORMAL LOW (ref >60)
Glucose, Bld: 111 mg/dL — ABNORMAL HIGH (ref 70–99)
Potassium: 3.8 mmol/L (ref 3.5–5.1)
Sodium: 140 mmol/L (ref 135–145)
Total Bilirubin: 0.4 mg/dL (ref 0.3–1.2)
Total Protein: 7.1 g/dL (ref 6.5–8.1)

## 2020-09-08 LAB — RESP PANEL BY RT-PCR (FLU A&B, COVID) ARPGX2
Influenza A by PCR: NEGATIVE
Influenza B by PCR: NEGATIVE
SARS Coronavirus 2 by RT PCR: NEGATIVE

## 2020-09-08 LAB — CBC
HCT: 28.2 % — ABNORMAL LOW (ref 36.0–46.0)
Hemoglobin: 9.6 g/dL — ABNORMAL LOW (ref 12.0–15.0)
MCH: 25.6 pg — ABNORMAL LOW (ref 26.0–34.0)
MCHC: 34 g/dL (ref 30.0–36.0)
MCV: 75.2 fL — ABNORMAL LOW (ref 80.0–100.0)
Platelets: 253 10*3/uL (ref 150–400)
RBC: 3.75 MIL/uL — ABNORMAL LOW (ref 3.87–5.11)
RDW: 18.3 % — ABNORMAL HIGH (ref 11.5–15.5)
WBC: 10.5 10*3/uL (ref 4.0–10.5)
nRBC: 0 % (ref 0.0–0.2)

## 2020-09-08 LAB — LIPASE, BLOOD: Lipase: 33 U/L (ref 11–51)

## 2020-09-08 MED ORDER — CEPHALEXIN 500 MG PO CAPS: 500.0000 mg | ORAL_CAPSULE | Freq: Four times a day (QID) | ORAL | 0 refills | Status: DC

## 2020-09-08 MED ORDER — SODIUM CHLORIDE 0.9 % IV SOLN
1.0000 g | Freq: Once | INTRAVENOUS | Status: AC
  Administered 2020-09-08: 1 g via INTRAVENOUS
  Filled 2020-09-08: qty 10

## 2020-09-08 MED ORDER — SODIUM CHLORIDE 0.9 % IV BOLUS
500.0000 mL | Freq: Once | INTRAVENOUS | Status: AC
  Administered 2020-09-08: 500 mL via INTRAVENOUS

## 2020-09-08 MED ORDER — ONDANSETRON HCL 4 MG/2ML IJ SOLN
4.0000 mg | Freq: Once | INTRAMUSCULAR | Status: AC
  Administered 2020-09-08: 4 mg via INTRAVENOUS
  Filled 2020-09-08: qty 2

## 2020-09-08 MED ORDER — ONDANSETRON HCL 4 MG PO TABS: 4.0000 mg | ORAL_TABLET | Freq: Three times a day (TID) | ORAL | 0 refills | Status: DC | PRN

## 2020-09-08 NOTE — ED Notes (Signed)
Please call son Sadee Osland @ [phone]--for a status update

## 2020-09-08 NOTE — ED Triage Notes (Signed)
Patient reports to the ER for Tremors, nausea,and emesis. Patient BIB EMS from [facility]. Patient is from the memory care unit and reportedly also has foul smelling urine.

## 2020-09-08 NOTE — ED Notes (Signed)
Rn called and gave patient's son Danelle Earthly an update on plan of care. Patient's son would like to know if patient is discharged or admitted.  Son reports he has noticed patient has had a tremor over the last month but assumed it was progression of her dementia.

## 2020-09-08 NOTE — ED Notes (Signed)
Patient report called to Rashidah at [facility]

## 2020-09-08 NOTE — Discharge Instructions (Addendum)
You were seen in the emergency department for nausea and vomiting, concerns for urinary tract infection.  You had a CAT scan of your head along with chest x-ray, blood work, COVID testing and a urinalysis.  You are slightly more anemic than your baseline and this will need to be followed up with your primary care doctor.  Your CAT scan did not show any obvious stroke.  Your COVID and flu testing were negative.  Your urine looked infected and you received an IV dose of antibiotics.  We are sending you home with a prescription to continue on antibiotics.

## 2020-09-08 NOTE — ED Provider Notes (Signed)
[facility] EMERGENCY DEPT Provider Note   CSN: [id number] Arrival date & time: 09/08/20  2003     History Chief Complaint  Patient presents with   Nausea   Tremors    Kimberly Gregory is a 85 y.o. female.  Level 5 caveat secondary to dementia.  Patient is from the memory care unit.  Staff said she was having shaking nausea and vomiting.  They are concerned she may have a UTI.  Patient is unable to give any history.  The history is provided by the EMS personnel.  Emesis Severity:  Moderate Duration:  1 day Timing:  Sporadic Quality:  Unable to specify Progression:  Unable to specify Chronicity:  New Relieved by:  None tried Worsened by:  Nothing Ineffective treatments:  None tried     Past Medical History:  Diagnosis Date   Acute encephalopathy 06/13/2016   AKI (acute kidney injury) (HCC) 06/13/2016   Arthritis    Chronic kidney disease    Dementia (HCC)    Depression 09/11/2018   Gastroesophageal reflux disease without esophagitis 09/11/2018   Headache    Hypercholesterolemia    Hypertension    Ischemic colitis (HCC) 10/24/2016   Laxative abuse 10/21/2016   SIRS (systemic inflammatory response syndrome) (HCC) 10/21/2016   Vertigo 10/14/2014    Patient Active Problem List   Diagnosis Date Noted   Visual hallucinations 08/18/2019   Gastroesophageal reflux disease without esophagitis 09/11/2018   Former smoker 09/11/2018   Vitamin D deficiency 09/11/2018   Depressive disorder in remission (HCC) 09/11/2018   Dementia with behavioral disturbance (HCC) 10/21/2016   Benign essential HTN 10/14/2014   Hyperlipidemia 10/14/2014    Past Surgical History:  Procedure Laterality Date   ABDOMINAL HYSTERECTOMY     BLADDER SURGERY     CATARACT EXTRACTION     CHOLECYSTECTOMY     COLONOSCOPY  2005   EYE SURGERY     KNEE SURGERY Right    arthroscopic     OB History   No obstetric history on file.     No family history on file.  Social History   Tobacco  Use   Smoking status: Former    Packs/day: 0.50    Years: 55.00    Pack years: 27.50    Types: Cigarettes    Quit date: 07/12/2019    Years since quitting: 1.1   Smokeless tobacco: Never   Tobacco comments:    Stopped smoking May 2021 per family  Vaping Use   Vaping Use: Never used  Substance Use Topics   Alcohol use: No   Drug use: No    Home Medications Prior to Admission medications   Medication Sig Start Date End Date Taking? Authorizing Provider  Cholecalciferol (VITAMIN D3) 1.25 MG (50000 UT) CAPS Take 1 capsule 3  /week for Vitamin D Deficiency Patient taking differently: Take 50,000 Units by mouth 3 (three) times a week. Take 1 capsule 3  /week for Vitamin D Deficiency 01/25/19   Lucky Cowboy, MD  Ensure (ENSURE) Take 237 mLs by mouth 2 (two) times daily between meals.    [provider]  OLANZapine (ZYPREXA) 10 MG tablet Take 1 tab at night for mood/agitation. Patient taking differently: Take 10 mg by mouth daily in the afternoon. 04/16/20   [name], NP    Allergies    Patient has no known allergies.  Review of Systems   Review of Systems  Unable to perform ROS: Dementia  Gastrointestinal:  Positive for vomiting.  Physical Exam Updated Vital Signs BP 102/74 (BP Location: Left Arm)   Pulse 78   Temp 97.8 F (36.6 C) (Oral)   Resp 17   SpO2 98%   Physical Exam Vitals and nursing note reviewed.  Constitutional:      General: She is not in acute distress.    Appearance: Normal appearance. She is well-developed.  HENT:     Head: Normocephalic and atraumatic.  Eyes:     Conjunctiva/sclera: Conjunctivae normal.  Cardiovascular:     Rate and Rhythm: Normal rate and regular rhythm.     Heart sounds: No murmur heard. Pulmonary:     Effort: Pulmonary effort is normal. No respiratory distress.     Breath sounds: Normal breath sounds.  Abdominal:     Palpations: Abdomen is soft.     Tenderness: There is no abdominal tenderness. There is no  guarding or rebound.  Musculoskeletal:        General: No deformity or signs of injury. Normal range of motion.     Cervical back: Neck supple.  Skin:    General: Skin is warm and dry.  Neurological:     General: No focal deficit present.     Mental Status: She is alert. She is disoriented.     Sensory: No sensory deficit.     Motor: No weakness.    ED Results / Procedures / Treatments   Labs (all labs ordered are listed, but only abnormal results are displayed) Labs Reviewed  COMPREHENSIVE METABOLIC PANEL - Abnormal; Notable for the following components:      Result Value   Glucose, Bld 111 (*)    BUN 29 (*)    Creatinine, Ser 1.14 (*)    GFR, Estimated 48 (*)    All other components within normal limits  CBC - Abnormal; Notable for the following components:   RBC 3.75 (*)    Hemoglobin 9.6 (*)    HCT 28.2 (*)    MCV 75.2 (*)    MCH 25.6 (*)    RDW 18.3 (*)    All other components within normal limits  URINALYSIS, ROUTINE W REFLEX MICROSCOPIC - Abnormal; Notable for the following components:   APPearance HAZY (*)    Hgb urine dipstick LARGE (*)    Protein, ur 30 (*)    Nitrite POSITIVE (*)    Leukocytes,Ua LARGE (*)    WBC, UA >50 (*)    Bacteria, UA RARE (*)    All other components within normal limits  LACTIC ACID, PLASMA - Abnormal; Notable for the following components:   Lactic Acid, Venous 2.0 (*)    All other components within normal limits  CBC - Abnormal; Notable for the following components:   WBC 12.4 (*)    RBC 3.21 (*)    Hemoglobin 8.4 (*)    HCT 25.0 (*)    MCV 77.9 (*)    RDW 18.7 (*)    All other components within normal limits  BASIC METABOLIC PANEL - Abnormal; Notable for the following components:   Glucose, Bld 109 (*)    Calcium 8.1 (*)    Anion gap 4 (*)    All other components within normal limits  CBC - Abnormal; Notable for the following components:   WBC 12.2 (*)    RBC 3.21 (*)    Hemoglobin 8.3 (*)    HCT 24.9 (*)    MCV 77.6  (*)    MCH 25.9 (*)    RDW 18.7 (*)  All other components within normal limits  RESP PANEL BY RT-PCR (FLU A&B, COVID) ARPGX2  URINE CULTURE  CULTURE, BLOOD (ROUTINE X 2)  CULTURE, BLOOD (ROUTINE X 2)  LIPASE, BLOOD  LACTIC ACID, PLASMA  CREATININE, SERUM  PROCALCITONIN    EKG EKG Interpretation  Date/Time:  Wednesday September 08 2020 21:08:41 EDT Ventricular Rate:  85 PR Interval:  144 QRS Duration: 96 QT Interval:  389 QTC Calculation: 463 R Axis:   -54 Text Interpretation: Sinus rhythm Left anterior fascicular block No significant change since No significant change since last tracing Confirmed by Meridee Score [phone]) on 09/08/2020 9:18:38 PM  Radiology CT Head Wo Contrast  Result Date: 09/08/2020 CLINICAL DATA:  Altered mental status. EXAM: CT HEAD WITHOUT CONTRAST TECHNIQUE: Contiguous axial images were obtained from the base of the skull through the vertex without intravenous contrast. COMPARISON:  October 21, 2016 FINDINGS: Brain: There is mild to moderate severity cerebral atrophy with widening of the extra-axial spaces and ventricular dilatation. There are areas of decreased attenuation within the white matter tracts of the supratentorial brain, consistent with microvascular disease changes. Vascular: No hyperdense vessel or unexpected calcification. Skull: Normal. Negative for fracture or focal lesion. Sinuses/Orbits: No acute finding. Other: None. IMPRESSION: 1. Generalized cerebral atrophy. 2. No acute intracranial abnormality. Electronically Signed   By: Aram Candela M.D.   On: 09/08/2020 20:49   DG Chest Port 1 View  Result Date: 09/08/2020 CLINICAL DATA:  Tremors with nausea and vomiting. EXAM: PORTABLE CHEST 1 VIEW COMPARISON:  April 21, 2020 FINDINGS: Mild, diffuse, chronic appearing increased interstitial lung markings are seen. Mild, stable right apical atelectasis is noted. There is no evidence of a pleural effusion or pneumothorax. The heart size and  mediastinal contours are within normal limits. There is mild calcification of the aortic arch. The visualized skeletal structures are unremarkable. IMPRESSION: No active cardiopulmonary disease. Electronically Signed   By: Aram Candela M.D.   On: 09/08/2020 20:54    Procedures Procedures   Medications Ordered in ED Medications  lactated ringers infusion ( Intravenous New Bag/Given 09/09/20 0152)  enoxaparin (LOVENOX) injection 40 mg (has no administration in time range)  lactated ringers infusion ( Intravenous Rate/Dose Change 09/09/20 0612)  cefTRIAXone (ROCEPHIN) 1 g in sodium chloride 0.9 % 100 mL IVPB (has no administration in time range)  acetaminophen (TYLENOL) tablet 650 mg (has no administration in time range)    Or  acetaminophen (TYLENOL) suppository 650 mg (has no administration in time range)  senna-docusate (Senokot-S) tablet 1 tablet (has no administration in time range)  ondansetron (ZOFRAN) tablet 4 mg (has no administration in time range)    Or  ondansetron (ZOFRAN) injection 4 mg (has no administration in time range)  sodium chloride 0.9 % bolus 500 mL (0 mLs Intravenous Stopped 09/09/20 0042)  ondansetron (ZOFRAN) injection 4 mg (4 mg Intravenous Given 09/08/20 2109)  cefTRIAXone (ROCEPHIN) 1 g in sodium chloride 0.9 % 100 mL IVPB (0 g Intravenous Stopped 09/09/20 0042)  acetaminophen (TYLENOL) 160 MG/5ML solution 640 mg (640 mg Oral Given 09/09/20 0035)  lactated ringers bolus 1,000 mL (0 mLs Intravenous Stopped 09/09/20 0225)    And  lactated ringers bolus 1,000 mL (1,000 mLs Intravenous New Bag/Given 09/09/20 0225)    And  lactated ringers bolus 250 mL (0 mLs Intravenous Stopped 09/09/20 0158)    ED Course  I have reviewed the triage vital signs and the nursing notes.  Pertinent labs & imaging results that were available during my care  of the patient were reviewed by me and considered in my medical decision making (see chart for details).    MDM  Rules/Calculators/A&P                         This patient complains of nausea and vomiting, staff concern for possible UTI; this involves an extensive number of treatment Options and is a complaint that carries with it a high risk of complications and Morbidity. The differential includes obstruction, UTI, pyelonephritis, metabolic derangement, stroke, bleed, pneumonia, COVID  I ordered, reviewed and interpreted labs, which included CBC with normal white count, hemoglobin slightly lower than baseline, chemistries fairly normal other than mildly elevated creatinine better than priors FTs, urinalysis concerning for infection nitrite positive greater than 50 whites, COVID and flu testing negative I ordered medication IV antibiotics and nausea medication, IV fluids I ordered imaging studies which included CT head and chest x-ray and I independently    visualized and interpreted imaging which showed no acute findings Additional history obtained from EMS Previous records obtained and reviewed in epic, no recent admissions  After the interventions stated above, I reevaluated the patient and found patient to be hemodynamically stable.  Borderline tachycardia.  She is awaiting transport by ambulance back to her facility.  Her care is signed out to oncoming provider Dr. Preston FleetingGlick with plan for her to return to her facility.   Final Clinical Impression(s) / ED Diagnoses Final diagnoses:  Non-intractable vomiting with nausea, unspecified vomiting type  Anemia, unspecified type  Urinary tract infection with hematuria, site unspecified  Sepsis due to undetermined organism [facility](HCC)    Rx / DC Orders ED Discharge Orders     None        Terrilee FilesButler, Davarious Tumbleson C, MD 09/09/20 [phone]

## 2020-09-09 ENCOUNTER — Encounter (HOSPITAL_COMMUNITY): Payer: Self-pay | Admitting: Internal Medicine

## 2020-09-09 DIAGNOSIS — Z9049 Acquired absence of other specified parts of digestive tract: Secondary | ICD-10-CM | POA: Diagnosis not present

## 2020-09-09 DIAGNOSIS — I444 Left anterior fascicular block: Secondary | ICD-10-CM | POA: Diagnosis present

## 2020-09-09 DIAGNOSIS — A419 Sepsis, unspecified organism: Secondary | ICD-10-CM | POA: Diagnosis not present

## 2020-09-09 DIAGNOSIS — K219 Gastro-esophageal reflux disease without esophagitis: Secondary | ICD-10-CM | POA: Diagnosis present

## 2020-09-09 DIAGNOSIS — N1831 Chronic kidney disease, stage 3a: Secondary | ICD-10-CM | POA: Diagnosis not present

## 2020-09-09 DIAGNOSIS — E7800 Pure hypercholesterolemia, unspecified: Secondary | ICD-10-CM | POA: Diagnosis present

## 2020-09-09 DIAGNOSIS — N3001 Acute cystitis with hematuria: Secondary | ICD-10-CM | POA: Diagnosis not present

## 2020-09-09 DIAGNOSIS — D631 Anemia in chronic kidney disease: Secondary | ICD-10-CM | POA: Diagnosis present

## 2020-09-09 DIAGNOSIS — R112 Nausea with vomiting, unspecified: Secondary | ICD-10-CM

## 2020-09-09 DIAGNOSIS — Z90710 Acquired absence of both cervix and uterus: Secondary | ICD-10-CM | POA: Diagnosis not present

## 2020-09-09 DIAGNOSIS — M1990 Unspecified osteoarthritis, unspecified site: Secondary | ICD-10-CM | POA: Diagnosis present

## 2020-09-09 DIAGNOSIS — D649 Anemia, unspecified: Secondary | ICD-10-CM

## 2020-09-09 DIAGNOSIS — I959 Hypotension, unspecified: Secondary | ICD-10-CM

## 2020-09-09 DIAGNOSIS — R6520 Severe sepsis without septic shock: Secondary | ICD-10-CM | POA: Diagnosis not present

## 2020-09-09 DIAGNOSIS — N183 Chronic kidney disease, stage 3 unspecified: Secondary | ICD-10-CM

## 2020-09-09 DIAGNOSIS — Z87891 Personal history of nicotine dependence: Secondary | ICD-10-CM | POA: Diagnosis not present

## 2020-09-09 DIAGNOSIS — I129 Hypertensive chronic kidney disease with stage 1 through stage 4 chronic kidney disease, or unspecified chronic kidney disease: Secondary | ICD-10-CM | POA: Diagnosis present

## 2020-09-09 DIAGNOSIS — D508 Other iron deficiency anemias: Secondary | ICD-10-CM | POA: Diagnosis not present

## 2020-09-09 DIAGNOSIS — Z20822 Contact with and (suspected) exposure to covid-19: Secondary | ICD-10-CM | POA: Diagnosis present

## 2020-09-09 DIAGNOSIS — R319 Hematuria, unspecified: Secondary | ICD-10-CM | POA: Diagnosis present

## 2020-09-09 DIAGNOSIS — B9620 Unspecified Escherichia coli [E. coli] as the cause of diseases classified elsewhere: Secondary | ICD-10-CM | POA: Diagnosis present

## 2020-09-09 DIAGNOSIS — I951 Orthostatic hypotension: Secondary | ICD-10-CM | POA: Diagnosis not present

## 2020-09-09 DIAGNOSIS — Z79899 Other long term (current) drug therapy: Secondary | ICD-10-CM | POA: Diagnosis not present

## 2020-09-09 DIAGNOSIS — A4151 Sepsis due to Escherichia coli [E. coli]: Secondary | ICD-10-CM | POA: Diagnosis not present

## 2020-09-09 DIAGNOSIS — F0391 Unspecified dementia with behavioral disturbance: Secondary | ICD-10-CM | POA: Diagnosis present

## 2020-09-09 DIAGNOSIS — N390 Urinary tract infection, site not specified: Secondary | ICD-10-CM | POA: Diagnosis present

## 2020-09-09 HISTORY — DX: Anemia, unspecified: D64.9

## 2020-09-09 LAB — CBC
HCT: 24.9 % — ABNORMAL LOW (ref 36.0–46.0)
HCT: 25 % — ABNORMAL LOW (ref 36.0–46.0)
Hemoglobin: 8.3 g/dL — ABNORMAL LOW (ref 12.0–15.0)
Hemoglobin: 8.4 g/dL — ABNORMAL LOW (ref 12.0–15.0)
MCH: 25.9 pg — ABNORMAL LOW (ref 26.0–34.0)
MCH: 26.2 pg (ref 26.0–34.0)
MCHC: 33.3 g/dL (ref 30.0–36.0)
MCHC: 33.6 g/dL (ref 30.0–36.0)
MCV: 77.6 fL — ABNORMAL LOW (ref 80.0–100.0)
MCV: 77.9 fL — ABNORMAL LOW (ref 80.0–100.0)
Platelets: 204 10*3/uL (ref 150–400)
Platelets: 208 10*3/uL (ref 150–400)
RBC: 3.21 MIL/uL — ABNORMAL LOW (ref 3.87–5.11)
RBC: 3.21 MIL/uL — ABNORMAL LOW (ref 3.87–5.11)
RDW: 18.7 % — ABNORMAL HIGH (ref 11.5–15.5)
RDW: 18.7 % — ABNORMAL HIGH (ref 11.5–15.5)
WBC: 12.2 10*3/uL — ABNORMAL HIGH (ref 4.0–10.5)
WBC: 12.4 10*3/uL — ABNORMAL HIGH (ref 4.0–10.5)
nRBC: 0 % (ref 0.0–0.2)
nRBC: 0 % (ref 0.0–0.2)

## 2020-09-09 LAB — BASIC METABOLIC PANEL
Anion gap: 4 — ABNORMAL LOW (ref 5–15)
BUN: 22 mg/dL (ref 8–23)
CO2: 26 mmol/L (ref 22–32)
Calcium: 8.1 mg/dL — ABNORMAL LOW (ref 8.9–10.3)
Chloride: 111 mmol/L (ref 98–111)
Creatinine, Ser: 0.9 mg/dL (ref 0.44–1.00)
GFR, Estimated: >60 mL/min (ref >60)
Glucose, Bld: 109 mg/dL — ABNORMAL HIGH (ref 70–99)
Potassium: 3.7 mmol/L (ref 3.5–5.1)
Sodium: 141 mmol/L (ref 135–145)

## 2020-09-09 LAB — LACTIC ACID, PLASMA
Lactic Acid, Venous: 2 mmol/L (ref 0.5–1.9)
Lactic Acid, Venous: 1.2 mmol/L (ref 0.5–1.9)

## 2020-09-09 LAB — PROCALCITONIN: Procalcitonin: 0.66 ng/mL

## 2020-09-09 LAB — CREATININE, SERUM
Creatinine, Ser: 0.94 mg/dL (ref 0.44–1.00)
GFR, Estimated: >60 mL/min (ref >60)

## 2020-09-09 MED ORDER — ONDANSETRON HCL 4 MG PO TABS: 4.0000 mg | ORAL_TABLET | Freq: Four times a day (QID) | ORAL | Status: DC | PRN

## 2020-09-09 MED ORDER — ACETAMINOPHEN 325 MG PO TABS: 650.0000 mg | ORAL_TABLET | Freq: Once | ORAL | Status: DC

## 2020-09-09 MED ORDER — SENNOSIDES-DOCUSATE SODIUM 8.6-50 MG PO TABS: 1.0000 | ORAL_TABLET | Freq: Every evening | ORAL | Status: DC | PRN

## 2020-09-09 MED ORDER — ENOXAPARIN SODIUM 40 MG/0.4ML IJ SOSY
40.0000 mg | PREFILLED_SYRINGE | INTRAMUSCULAR | Status: DC
  Administered 2020-09-09 – 2020-09-11 (×3): 40 mg via SUBCUTANEOUS
  Filled 2020-09-09 (×4): qty 0.4

## 2020-09-09 MED ORDER — ACETAMINOPHEN 160 MG/5ML PO SOLN
640.0000 mg | Freq: Once | ORAL | Status: AC
  Administered 2020-09-09: 640 mg via ORAL
  Filled 2020-09-09: qty 20.3

## 2020-09-09 MED ORDER — LACTATED RINGERS IV BOLUS (SEPSIS)
1000.0000 mL | Freq: Once | INTRAVENOUS | Status: AC
  Administered 2020-09-09: 1000 mL via INTRAVENOUS

## 2020-09-09 MED ORDER — ACETAMINOPHEN 325 MG PO TABS
650.0000 mg | ORAL_TABLET | Freq: Four times a day (QID) | ORAL | Status: DC | PRN
  Administered 2020-09-09 – 2020-09-10 (×3): 650 mg via ORAL
  Filled 2020-09-09 (×4): qty 2

## 2020-09-09 MED ORDER — LACTATED RINGERS IV BOLUS (SEPSIS)
250.0000 mL | Freq: Once | INTRAVENOUS | Status: AC
  Administered 2020-09-09: 250 mL via INTRAVENOUS

## 2020-09-09 MED ORDER — LACTATED RINGERS IV SOLN: INTRAVENOUS | Status: DC

## 2020-09-09 MED ORDER — ACETAMINOPHEN 650 MG RE SUPP
650.0000 mg | Freq: Four times a day (QID) | RECTAL | Status: DC | PRN
  Administered 2020-09-10: 650 mg via RECTAL
  Filled 2020-09-09: qty 1

## 2020-09-09 MED ORDER — ONDANSETRON HCL 4 MG/2ML IJ SOLN: 4.0000 mg | Freq: Four times a day (QID) | INTRAMUSCULAR | Status: DC | PRN

## 2020-09-09 MED ORDER — SODIUM CHLORIDE 0.9 % IV SOLN
1.0000 g | INTRAVENOUS | Status: DC
  Administered 2020-09-09 – 2020-09-10 (×2): 1 g via INTRAVENOUS
  Filled 2020-09-09: qty 10
  Filled 2020-09-09: qty 1

## 2020-09-09 NOTE — Sepsis Progress Note (Signed)
[facility] following for Code Sepsis.  Abx given at 2310 on 6/29 but Code Sepsis was not called until 0108 on 6/30

## 2020-09-09 NOTE — ED Notes (Signed)
EDP Preston Fleeting made aware of patients most recent VS. No New Orders at this time; staff will continue to monitor patient for further changes.

## 2020-09-09 NOTE — H&P (Addendum)
History and Physical    Kimberly Gregory:[id number] DOB: March 07, 1936 DOA: 09/08/2020  PCP: Unk Pinto, MD   Patient coming from: Assisted living facility  Chief Complaint: nausea, vomiting  HPI: Kimberly Gregory is a 85 y.o. female with medical history significant for Dementia, HTN, HLD, GERD, OA who presents to [facility] from assisted living facility with report of nausea, vomiting and shaking.  The staff had reported to the ER that patient has had these type of symptoms before with a UTI.  She had any dysuria or urinary frequency.  She did have multiple episodes of vomiting at the facility.  In the emergency room she was given Zofran and vomiting was controlled.  Initially she was given to be discharged back to the assisted living facility when she developed a fever and her blood pressure dropped.  It was then felt that patient probably had sepsis and was kept in the ER for continued work-up.  Lactic acid was mildly elevated at 2.0.  Blood pressure improved with IV fluid hydration.  ER physician discussed with covering hospitalist, Dr. Nevada Crane, who accept the patient in transfer for admission for sepsis and UTI.  ED Course: [name] had low blood pressure down to 94/48 in the emergency room.  Blood pressure improved after IV fluid hydration.  She also had a T-max of 103.2 degrees.  She was given Tylenol.  She was given IV fluid bolus and continued on LR IV fluid infusion.  Urinalysis came back positive for UTI.  Patient was started on Rocephin.  X-ray was negative for infiltrate or consolidation.  Labs revealed a sodium 140, potassium 3.8, chloride 106, bicarb 27, creatinine 1.14, BUN 29, normal LFTs, glucose 111.  WBC was 10,500, hemoglobin 9.6, hematocrit 20.2, platelets are 53,000.  Lactic acid was 2.0.  Hospitalist service was asked to admit for further management.  Patient was accepted to progressive care bed at [facility] hospital  Review of Systems:  Unable to obtain accurate review  of systems secondary to dementia  Past Medical History:  Diagnosis Date   Acute encephalopathy 06/13/2016   AKI (acute kidney injury) ([place]) 06/13/2016   Anemia 09/09/2020   Arthritis    Chronic kidney disease    Dementia ([place])    Depression 09/11/2018   Gastroesophageal reflux disease without esophagitis 09/11/2018   Headache    Hypercholesterolemia    Hypertension    Ischemic colitis ([place]) 10/24/2016   Laxative abuse 10/21/2016   SIRS (systemic inflammatory response syndrome) ([place]) 10/21/2016   Vertigo 10/14/2014    Past Surgical History:  Procedure Laterality Date   ABDOMINAL HYSTERECTOMY     BLADDER SURGERY     CATARACT EXTRACTION     CHOLECYSTECTOMY     COLONOSCOPY  2005   EYE SURGERY     KNEE SURGERY Right    arthroscopic    Social History  reports that she quit smoking about 13 months ago. Her smoking use included cigarettes. She has a 27.50 pack-year smoking history. She has never used smokeless tobacco. She reports that she does not drink alcohol and does not use drugs.  No Known Allergies  History reviewed. No pertinent family history.   Prior to Admission medications   Medication Sig Start Date End Date Taking? Authorizing Provider  cephALEXin (KEFLEX) 500 MG capsule Take 1 capsule (500 mg total) by mouth 4 (four) times daily. 09/08/20  Yes Hayden Rasmussen, MD  ondansetron (ZOFRAN) 4 MG tablet Take 1 tablet (4 mg total) by mouth  every 8 (eight) hours as needed for nausea or vomiting. 09/08/20  Yes Hayden Rasmussen, MD  Cholecalciferol (VITAMIN D3) 1.25 MG (50000 UT) CAPS Take 1 capsule 3  /week for Vitamin D Deficiency Patient taking differently: Take 50,000 Units by mouth 3 (three) times a week. Take 1 capsule 3  /week for Vitamin D Deficiency 01/25/19   Unk Pinto, MD  Ensure (ENSURE) Take 237 mLs by mouth 2 (two) times daily between meals.    [provider]  OLANZapine (ZYPREXA) 10 MG tablet Take 1 tab at night for mood/agitation. Patient taking  differently: Take 10 mg by mouth daily in the afternoon. 04/16/20   Liane Comber, NP    Physical Exam: Vitals:   09/09/20 0129 09/09/20 0130 09/09/20 0200 09/09/20 0409  BP:  (!) 107/46 (!) 118/51 (!) 99/51  Pulse:   92 82  Resp:  (!) $Re'25 20 18  'IAx$ Temp: 99.2 F (37.3 C)   98.7 F (37.1 C)  TempSrc: Oral   Oral  SpO2:   93% 99%    Constitutional: NAD, calm, comfortable Vitals:   09/09/20 0129 09/09/20 0130 09/09/20 0200 09/09/20 0409  BP:  (!) 107/46 (!) 118/51 (!) 99/51  Pulse:   92 82  Resp:  (!) $Re'25 20 18  'aQn$ Temp: 99.2 F (37.3 C)   98.7 F (37.1 C)  TempSrc: Oral   Oral  SpO2:   93% 99%   General: WDWN, Alert and oriented to self Eyes: EOMI, PERRL, conjunctivae normal.  Sclera nonicteric HENT:  [place]/AT, external ears normal.  Nares patent without epistasis.  Mucous membranes are dry Neck: Soft, normal range of motion, supple, no masses, no thyromegaly.  Trachea midline Respiratory: clear to auscultation bilaterally, no wheezing, no crackles. Normal respiratory effort. No accessory muscle use.  Cardiovascular: Regular rate and rhythm, no murmurs / rubs / gallops. No extremity edema.  Abdomen: Soft, has suprapubic tenderness, nondistended, no rebound or guarding.  No masses palpated. Bowel sounds normoactive Musculoskeletal: FROM. no cyanosis. No joint deformity upper and lower extremities. Normal muscle tone.  Skin: Warm, dry, intact no rashes, lesions, ulcers. No induration Neurologic: CN 2-12 grossly intact. Normal speech. Sensation intact to touch. Strength 5/5 in all extremities.   Psychiatric: Normal mood, easily agitated and does not want to be woken up or disturbed.   Labs on Admission: I have personally reviewed following labs and imaging studies  CBC: Recent Labs  Lab 09/08/20 2007  WBC 10.5  HGB 9.6*  HCT 28.2*  MCV 75.2*  PLT 122    Basic Metabolic Panel: Recent Labs  Lab 09/08/20 2007  NA 140  K 3.8  CL 106  CO2 27  GLUCOSE 111*  BUN 29*   CREATININE 1.14*  CALCIUM 9.2    GFR: CrCl cannot be calculated (Unknown ideal weight.).  Liver Function Tests: Recent Labs  Lab 09/08/20 2007  AST 21  ALT 18  ALKPHOS 56  BILITOT 0.4  PROT 7.1  ALBUMIN 3.5    Urine analysis:    Component Value Date/Time   COLORURINE YELLOW 09/08/2020 2235   APPEARANCEUR HAZY (A) 09/08/2020 2235   LABSPEC 1.018 09/08/2020 2235   PHURINE 6.5 09/08/2020 2235   GLUCOSEU NEGATIVE 09/08/2020 2235   HGBUR LARGE (A) 09/08/2020 2235   BILIRUBINUR NEGATIVE 09/08/2020 [place] 09/08/2020 2235   PROTEINUR 30 (A) 09/08/2020 2235   UROBILINOGEN 1.0 10/14/2014 0745   NITRITE POSITIVE (A) 09/08/2020 2235   LEUKOCYTESUR LARGE (A) 09/08/2020 2235  Radiological Exams on Admission: CT Head Wo Contrast  Result Date: 09/08/2020 CLINICAL DATA:  Altered mental status. EXAM: CT HEAD WITHOUT CONTRAST TECHNIQUE: Contiguous axial images were obtained from the base of the skull through the vertex without intravenous contrast. COMPARISON:  October 21, 2016 FINDINGS: Brain: There is mild to moderate severity cerebral atrophy with widening of the extra-axial spaces and ventricular dilatation. There are areas of decreased attenuation within the white matter tracts of the supratentorial brain, consistent with microvascular disease changes. Vascular: No hyperdense vessel or unexpected calcification. Skull: Normal. Negative for fracture or focal lesion. Sinuses/Orbits: No acute finding. Other: None. IMPRESSION: 1. Generalized cerebral atrophy. 2. No acute intracranial abnormality. Electronically Signed   By: Virgina Norfolk M.D.   On: 09/08/2020 20:49   DG Chest Port 1 View  Result Date: 09/08/2020 CLINICAL DATA:  Tremors with nausea and vomiting. EXAM: PORTABLE CHEST 1 VIEW COMPARISON:  April 21, 2020 FINDINGS: Mild, diffuse, chronic appearing increased interstitial lung markings are seen. Mild, stable right apical atelectasis is noted. There is no  evidence of a pleural effusion or pneumothorax. The heart size and mediastinal contours are within normal limits. There is mild calcification of the aortic arch. The visualized skeletal structures are unremarkable. IMPRESSION: No active cardiopulmonary disease. Electronically Signed   By: Virgina Norfolk M.D.   On: 09/08/2020 20:54    EKG: Independently reviewed.  EKG shows normal sinus rhythm with left anterior fascicular block.  No acute ST elevation or depression.  QTc 463  Assessment/Plan Principal Problem:   Sepsis  Kimberly Gregory is admitted to Progressive care bed from [facility] ER with sepsis.  She has been started on antibiotic therapy with Rocephin. She was given IV fluid bolus and infusion with LR at the ER.  Patient met sepsis criteria with fever, tachycardia, low blood pressure.  Low blood pressure with improved with IV fluid administration.  Source of infection is urine. Continue LR at 125 mL/hr.  Lactic acid level was 2.0.  We will recheck lactic acid serially over the next few hours. Check procalcitonin level  Active Problems:   Urinary tract infection with hematuria Treat with Rocephin and IV fluid hydration.    Hypotension Improved with IV fluids and monitor blood pressure      CKD 3A Chronic. Continue IVF hydration. Monitor electrolytes and renal function with labs in am.    Anemia Patient with chronic mild anemia.  Check CBC in morning    Nausea and vomiting Zofran provided as needed for nausea and vomiting.    Dementia with behavioral disturbance  Chronic     DVT prophylaxis: Lovenox for DVT prophylaxis  Code Status:   Full code Family Communication:  No family at bedside.  Patient has dementia.  Further recommendations to follow as clinically indicated Disposition Plan:   Patient is from:  Assisted living facility  Anticipated DC to:  Assisted living facility  Anticipated DC date:  Anticipate 2 midnight or more stay  Anticipated DC barriers: No barriers  to discharge identified at this time  Admission status:  Inpatient   Yevonne Aline Celeste Tavenner MD [facility]  How to contact the [facility] Attending or Consulting provider [place] or covering provider during after hours [place], for this patient?   Check the care team in [facility] and look for a) attending/consulting [facility] provider listed and b) the [facility] team listed Log into [email or web address] and use [place]'s universal password to access. If you do not have the password, please contact the  hospital operator. Locate the [facility] provider you are looking for under [facility] and page to a number that you can be directly reached. If you still have difficulty reaching the provider, please page the [facility] (Director on Call) for the Hospitalists listed on amion for assistance.  09/09/2020, 4:56 AM

## 2020-09-09 NOTE — ED Notes (Signed)
Called and cancelled PTAR--patient being admitted

## 2020-09-09 NOTE — ED Notes (Signed)
MD Preston Fleeting made aware of patients Lactic Acid Level 2.2; no new orders at this time.

## 2020-09-09 NOTE — ED Notes (Signed)
[facility] called to transport patient to [facility] [id number]

## 2020-09-09 NOTE — Progress Notes (Signed)
85 year old female with history of severe dementia, hypertension and multiple other problems was brought into the ED from assisted living facility with nausea and vomiting and was eventually diagnosed with sepsis secondary to UTI.  In fact she meets criteria for severe sepsis based on tachycardia, fever tachypnea and low blood pressure, her lowest blood pressure was 94/48.  She also had lactic acid of 2 which has resolved after she received IV fluids.  She was diagnosed with UTI and was started on antibiotics.  Has elevated procalcitonin, leukocytosis is stable.  Slightly elevated procalcitonin.  We will continue current antibiotics and follow urine and blood culture and tailor antibiotics accordingly.  We will consult PT OT.  Patient is alert but pleasantly confused, this is her baseline as per the daughter who I spoke to over the phone.  Anticipate discharge back to assisted living facility tomorrow if everything continues to improve.  On exam, her lungs are clear to auscultation, abdomen soft and nontender

## 2020-09-09 NOTE — ED Notes (Signed)
Physician made aware of patient having a temp of 103.2, Tachy 112, hypotensive, and RR 30.

## 2020-09-09 NOTE — ED Notes (Signed)
This RN called Copy for [employer]

## 2020-09-09 NOTE — ED Notes (Signed)
This nurse called and updated Kimberly Gregory at [facility] that patient status had changed and patient would be admitted.

## 2020-09-09 NOTE — ED Provider Notes (Signed)
Patient had been set up for discharge with diagnosis of vomiting, anemia, urinary tract infection.  She had received IV ceftriaxone.  While waiting for discharge, patient spiked fever to 103.1.  Blood pressure has dropped below 100 and heart rate has increased to 118.  This is concerning for patient developing sepsis.  Blood cultures are obtained, although they are likely to be negative given the fact that she has antibiotics in her system.  She is also given early, goal-directed fluids.  Lactic acid level was drawn.  She will need to be admitted.  Case is discussed with Dr. Margo Aye of [facility], who agrees to admit the patient in transfer to [facility].  Of note, blood pressure and heart rate have responded well to IV fluids.  CRITICAL CARE Performed by: Dione Booze Total critical care time: 35 minutes Critical care time was exclusive of separately billable procedures and treating other patients. Critical care was necessary to treat or prevent imminent or life-threatening deterioration. Critical care was time spent personally by me on the following activities: development of treatment plan with patient and/or surrogate as well as nursing, discussions with consultants, evaluation of patient's response to treatment, examination of patient, obtaining history from patient or surrogate, ordering and performing treatments and interventions, ordering and review of laboratory studies, ordering and review of radiographic studies, pulse oximetry and re-evaluation of patient's condition.   Dione Booze, MD 09/09/20 [phone]

## 2020-09-09 NOTE — ED Notes (Signed)
This nurse spoke to a Mrs. Marshall at [facility]. Mrs. Gaynell Face reports that patient is a DNR. This nurse informed Mrs. Gaynell Face that unless this facility had a DNR on site for patient, the medical staff would have to treat patient as a full code. Mrs. Gaynell Face states someone from the facility will bring the DNR for patient record.

## 2020-09-10 LAB — BASIC METABOLIC PANEL
Anion gap: 6 (ref 5–15)
BUN: 20 mg/dL (ref 8–23)
CO2: 25 mmol/L (ref 22–32)
Calcium: 8 mg/dL — ABNORMAL LOW (ref 8.9–10.3)
Chloride: 108 mmol/L (ref 98–111)
Creatinine, Ser: 0.96 mg/dL (ref 0.44–1.00)
GFR, Estimated: 59 mL/min — ABNORMAL LOW (ref >60)
Glucose, Bld: 81 mg/dL (ref 70–99)
Potassium: 4 mmol/L (ref 3.5–5.1)
Sodium: 139 mmol/L (ref 135–145)

## 2020-09-10 LAB — CBC WITH DIFFERENTIAL/PLATELET
Abs Immature Granulocytes: 0.02 10*3/uL (ref 0.00–0.07)
Basophils Absolute: 0 10*3/uL (ref 0.0–0.1)
Basophils Relative: 0 %
Eosinophils Absolute: 0 10*3/uL (ref 0.0–0.5)
Eosinophils Relative: 0 %
HCT: 25.8 % — ABNORMAL LOW (ref 36.0–46.0)
Hemoglobin: 8.8 g/dL — ABNORMAL LOW (ref 12.0–15.0)
Immature Granulocytes: 0 %
Lymphocytes Relative: 16 %
Lymphs Abs: 1.3 10*3/uL (ref 0.7–4.0)
MCH: 25.7 pg — ABNORMAL LOW (ref 26.0–34.0)
MCHC: 34.1 g/dL (ref 30.0–36.0)
MCV: 75.4 fL — ABNORMAL LOW (ref 80.0–100.0)
Monocytes Absolute: 0.9 10*3/uL (ref 0.1–1.0)
Monocytes Relative: 11 %
Neutro Abs: 5.9 10*3/uL (ref 1.7–7.7)
Neutrophils Relative %: 73 %
Platelets: 219 10*3/uL (ref 150–400)
RBC: 3.42 MIL/uL — ABNORMAL LOW (ref 3.87–5.11)
RDW: 18.8 % — ABNORMAL HIGH (ref 11.5–15.5)
WBC: 8.1 10*3/uL (ref 4.0–10.5)
nRBC: 0 % (ref 0.0–0.2)

## 2020-09-10 LAB — MAGNESIUM: Magnesium: 1.5 mg/dL — ABNORMAL LOW (ref 1.7–2.4)

## 2020-09-10 LAB — MRSA PCR SCREENING: MRSA by PCR: POSITIVE — AB

## 2020-09-10 MED ORDER — ADULT MULTIVITAMIN W/MINERALS CH
1.0000 | ORAL_TABLET | Freq: Every day | ORAL | Status: DC
  Administered 2020-09-10 – 2020-09-12 (×2): 1 via ORAL
  Filled 2020-09-10 (×3): qty 1

## 2020-09-10 MED ORDER — MAGNESIUM SULFATE 2 GM/50ML IV SOLN
2.0000 g | Freq: Once | INTRAVENOUS | Status: AC
  Administered 2020-09-10: 2 g via INTRAVENOUS
  Filled 2020-09-10: qty 50

## 2020-09-10 MED ORDER — ENSURE ENLIVE PO LIQD
237.0000 mL | ORAL | Status: DC
  Administered 2020-09-10 – 2020-09-11 (×2): 237 mL via ORAL

## 2020-09-10 MED ORDER — MUPIROCIN 2 % EX OINT
1.0000 "application " | TOPICAL_OINTMENT | Freq: Two times a day (BID) | CUTANEOUS | Status: DC
  Administered 2020-09-10 – 2020-09-12 (×6): 1 via NASAL
  Filled 2020-09-10: qty 22

## 2020-09-10 MED ORDER — SERTRALINE HCL 25 MG PO TABS
25.0000 mg | ORAL_TABLET | Freq: Every day | ORAL | Status: DC
  Administered 2020-09-10 – 2020-09-12 (×3): 25 mg via ORAL
  Filled 2020-09-10 (×3): qty 1

## 2020-09-10 MED ORDER — BOOST / RESOURCE BREEZE PO LIQD CUSTOM: 1.0000 | ORAL | Status: DC

## 2020-09-10 MED ORDER — ARIPIPRAZOLE 2 MG PO TABS
2.0000 mg | ORAL_TABLET | Freq: Every day | ORAL | Status: DC
  Administered 2020-09-10 – 2020-09-12 (×3): 2 mg via ORAL
  Filled 2020-09-10 (×3): qty 1

## 2020-09-10 MED ORDER — CHLORHEXIDINE GLUCONATE CLOTH 2 % EX PADS
6.0000 | MEDICATED_PAD | Freq: Every day | CUTANEOUS | Status: DC
  Administered 2020-09-10 – 2020-09-12 (×3): 6 via TOPICAL

## 2020-09-10 MED ORDER — MELATONIN 3 MG PO TABS
3.0000 mg | ORAL_TABLET | Freq: Every day | ORAL | Status: DC
  Administered 2020-09-10 – 2020-09-11 (×2): 3 mg via ORAL
  Filled 2020-09-10 (×2): qty 1

## 2020-09-10 MED ORDER — DIVALPROEX SODIUM 125 MG PO CSDR
250.0000 mg | DELAYED_RELEASE_CAPSULE | Freq: Three times a day (TID) | ORAL | Status: DC
  Administered 2020-09-10 – 2020-09-12 (×5): 250 mg via ORAL
  Filled 2020-09-10 (×7): qty 2

## 2020-09-10 NOTE — Progress Notes (Addendum)
PROGRESS NOTE    Kimberly Gregory  EUM:[id number] DOB: 1936-02-23 DOA: 09/08/2020 PCP: Unk Pinto, MD   Brief Narrative:  85 year old female with history of severe dementia, hypertension and multiple other problems was brought into the ED from assisted living facility with nausea and vomiting and was eventually diagnosed with sepsis secondary to UTI.  In fact she meets criteria for severe sepsis based on tachycardia, fever tachypnea and low blood pressure, her lowest blood pressure was 94/48.  She also had lactic acid of 2 which has resolved after she received IV fluids.  She was diagnosed with UTI and was started on antibiotics.  Has elevated procalcitonin, leukocytosis is stable.  Slightly elevated procalcitonin.    Assessment & Plan:   Principal Problem:   Sepsis ([place]) Active Problems:   Dementia with behavioral disturbance (HCC)   Urinary tract infection with hematuria   Anemia   Nausea and vomiting   Hypotension   CKD (chronic kidney disease) stage 3, GFR 30-59 ml/min (HCC)   Severe sepsis secondary to UTI: She met criteria for severe sepsis based on fever, tachycardia, tachypnea and hypotension with initial blood pressure of 94/48.  Cultures negative.  She had fever of 103 this morning again.  No leukocytosis.  Continue Rocephin and follow culture.  Observe until fever free for at least 24 hours.  Hypomagnesemia: We will replace.  CKD stage IIIa: At baseline.  Monitor.  Hypotension: Resolved.  Monitor closely.  CKD stage IIIa: Stable at baseline.  Advanced dementia: No behavioral disturbances.  Continue current management.  Chronic anemia: Stable.  DVT prophylaxis: enoxaparin (LOVENOX) injection 40 mg Start: 09/09/20 1000   Code Status: Full Code  Family Communication: None present at bedside.    Status is: Inpatient  Remains inpatient appropriate because:Inpatient level of care appropriate due to severity of illness  Dispo: The patient is from: ALF               Anticipated d/c is to: ALF              Patient currently is not medically stable to d/c.   Difficult to place patient No        Estimated body mass index is 27.25 kg/m as calculated from the following:   Height as of 02/18/19: 5' 1.75" (1.568 m).   Weight as of 11/19/19: 67 kg.      Nutritional status:               Consultants:  None  Procedures:  None  Antimicrobials:  Anti-infectives (From admission, onward)    Start     Dose/Rate Route Frequency Ordered Stop   09/09/20 2200  cefTRIAXone (ROCEPHIN) 1 g in sodium chloride 0.9 % 100 mL IVPB        1 g 200 mL/hr over 30 Minutes Intravenous Every 24 hours 09/09/20 0455     09/08/20 2315  cefTRIAXone (ROCEPHIN) 1 g in sodium chloride 0.9 % 100 mL IVPB        1 g 200 mL/hr over 30 Minutes Intravenous  Once 09/08/20 2302 09/09/20 0042   09/08/20 0000  cephALEXin (KEFLEX) 500 MG capsule        500 mg Oral 4 times daily 09/08/20 2303            Subjective: Seen and examined.  Patient alert and oriented to self only and has no symptoms.  Looks comfortable.  Objective: Vitals:   09/09/20 1706 09/09/20 1712 09/09/20 2211 09/10/20 0715  BP: Marland Kitchen)  142/126  (!) 106/47 115/61  Pulse: 95  80 90  Resp: _0 Temp:  99.4 F (37.4 C) 100 F (37.8 C) (!) 103 F (39.4 C)  TempSrc:  Oral Oral Oral  SpO2: 91%  93% 93%    Intake/Output Summary (Last 24 hours) at 09/10/2020 0752 Last data filed at 09/10/2020 0700 Gross per 24 hour  Intake 1984.29 ml  Output 1100 ml  Net 884.29 ml   There were no vitals filed for this visit.  Examination:  General exam: Appears calm and comfortable  Respiratory system: Clear to auscultation. Respiratory effort normal. Cardiovascular system: S1 & S2 heard, RRR. No JVD, murmurs, rubs, gallops or clicks. No pedal edema. Gastrointestinal system: Abdomen is nondistended, soft and nontender. No organomegaly or masses felt. Normal bowel sounds heard. Central nervous system: Alert  and oriented x1. No focal neurological deficits. Extremities: Symmetric 5 x 5 power. Skin: No rashes, lesions or ulcers.  Psychiatry: Judgement and insight appear poor   Data Reviewed: I have personally reviewed following labs and imaging studies  CBC: Recent Labs  Lab 09/08/20 2007 09/09/20 0612 09/10/20 0419  WBC 10.5 12.2*  12.4* 8.1  NEUTROABS  --   --  5.9  HGB 9.6* 8.3*  8.4* 8.8*  HCT 28.2* 24.9*  25.0* 25.8*  MCV 75.2* 77.6*  77.9* 75.4*  PLT 253 204  208 498   Basic Metabolic Panel: Recent Labs  Lab 09/08/20 2007 09/09/20 0612 09/10/20 0419  NA 140 141 139  K 3.8 3.7 4.0  CL 106 111 108  CO2 _1 GLUCOSE 111* 109* 81  BUN 29* 22 20  CREATININE 1.14* 0.90  0.94 0.96  CALCIUM 9.2 8.1* 8.0*  MG  --   --  1.5*   GFR: CrCl cannot be calculated (Unknown ideal weight.). Liver Function Tests: Recent Labs  Lab 09/08/20 2007  AST 21  ALT 18  ALKPHOS 56  BILITOT 0.4  PROT 7.1  ALBUMIN 3.5   Recent Labs  Lab 09/08/20 2007  LIPASE 33   No results for input(s): AMMONIA in the last 168 hours. Coagulation Profile: No results for input(s): INR, PROTIME in the last 168 hours. Cardiac Enzymes: No results for input(s): CKTOTAL, CKMB, CKMBINDEX, TROPONINI in the last 168 hours. BNP (last 3 results) No results for input(s): PROBNP in the last 8760 hours. HbA1C: No results for input(s): HGBA1C in the last 72 hours. CBG: No results for input(s): GLUCAP in the last 168 hours. Lipid Profile: No results for input(s): CHOL, HDL, LDLCALC, TRIG, CHOLHDL, LDLDIRECT in the last 72 hours. Thyroid Function Tests: No results for input(s): TSH, T4TOTAL, FREET4, T3FREE, THYROIDAB in the last 72 hours. Anemia Panel: No results for input(s): VITAMINB12, FOLATE, FERRITIN, TIBC, IRON, RETICCTPCT in the last 72 hours. Sepsis Labs: Recent Labs  Lab 09/09/20 0120 09/09/20 0446 09/09/20 0612  PROCALCITON  --   --  0.66  LATICACIDVEN 2.0* 1.2  --     Recent  Results (from the past 240 hour(s))  Resp Panel by RT-PCR (Flu A&B, Covid) Nasopharyngeal Swab     Status: None   Collection Time: 09/08/20  8:59 PM   Specimen: Nasopharyngeal Swab; Nasopharyngeal(NP) swabs in vial transport medium  Result Value Ref Range Status   SARS Coronavirus 2 by RT PCR NEGATIVE NEGATIVE Final    Comment: (NOTE) SARS-CoV-2 target nucleic acids are NOT DETECTED.  The SARS-CoV-2 RNA is generally detectable in upper respiratory specimens during the acute phase of  infection. The lowest concentration of SARS-CoV-2 viral copies this assay can detect is 138 copies/mL. A negative result does not preclude SARS-Cov-2 infection and should not be used as the sole basis for treatment or other patient management decisions. A negative result may occur with  improper specimen collection/handling, submission of specimen other than nasopharyngeal swab, presence of viral mutation(s) within the areas targeted by this assay, and inadequate number of viral copies(<138 copies/mL). A negative result must be combined with clinical observations, patient history, and epidemiological information. The expected result is Negative.  Fact Sheet for Patients:  EntrepreneurPulse.com.au  Fact Sheet for Healthcare Providers:  IncredibleEmployment.be  This test is no t yet approved or cleared by the [place] FDA and  has been authorized for detection and/or diagnosis of SARS-CoV-2 by FDA under an Emergency Use Authorization (EUA). This EUA will remain  in effect (meaning this test can be used) for the duration of the COVID-19 declaration under Section 564(b)(1) of the Act, 21 U.S.C.section 360bbb-3(b)(1), unless the authorization is terminated  or revoked sooner.       Influenza A by PCR NEGATIVE NEGATIVE Final   Influenza B by PCR NEGATIVE NEGATIVE Final    Comment: (NOTE) The Xpert Xpress SARS-CoV-2/FLU/RSV plus assay is intended as an aid in the  diagnosis of influenza from Nasopharyngeal swab specimens and should not be used as a sole basis for treatment. Nasal washings and aspirates are unacceptable for Xpert Xpress SARS-CoV-2/FLU/RSV testing.  Fact Sheet for Patients: EntrepreneurPulse.com.au  Fact Sheet for Healthcare Providers: IncredibleEmployment.be  This test is not yet approved or cleared by the [place] FDA and has been authorized for detection and/or diagnosis of SARS-CoV-2 by FDA under an Emergency Use Authorization (EUA). This EUA will remain in effect (meaning this test can be used) for the duration of the COVID-19 declaration under Section 564(b)(1) of the Act, 21 U.S.C. section 360bbb-3(b)(1), unless the authorization is terminated or revoked.  Performed at [facility], [address], [place], [place] [address]   Culture, blood (routine x 2)     Status: None (Preliminary result)   Collection Time: 09/09/20  1:22 AM   Specimen: BLOOD  Result Value Ref Range Status   Specimen Description BLOOD BLOOD LEFT HAND  Final   Special Requests   Final    BOTTLES DRAWN AEROBIC AND ANAEROBIC Blood Culture adequate volume   Culture   Final    NO GROWTH < 24 HOURS Performed at [facility], [place] [address], [place], [place] [address]    Report Status PENDING  Incomplete  Culture, blood (routine x 2)     Status: None (Preliminary result)   Collection Time: 09/09/20  1:35 AM   Specimen: BLOOD  Result Value Ref Range Status   Specimen Description BLOOD BLOOD RIGHT HAND  Final   Special Requests   Final    BOTTLES DRAWN AEROBIC AND ANAEROBIC Blood Culture adequate volume   Culture   Final    NO GROWTH < 24 HOURS Performed at [facility], [place] [address] [place], [place] [address]    Report Status PENDING  Incomplete  MRSA PCR Screening     Status: Abnormal   Collection Time: 09/09/20  7:18 PM  Result Value Ref Range Status   MRSA by PCR  POSITIVE (A) NEGATIVE Final    Comment:        The GeneXpert MRSA Assay (FDA approved for NASAL specimens only), is one component of a comprehensive MRSA colonization  surveillance program. It is not intended to diagnose MRSA infection nor to guide or monitor treatment for MRSA infections. RESULT CALLED TO, READ BACK BY AND VERIFIED WITH: HILARY, RN @ 0040 ON 09/10/20 Sandy Salaam Performed at [facility], [place] [address], [place], [place] [address]       Radiology Studies: CT Head Wo Contrast  Result Date: 09/08/2020 CLINICAL DATA:  Altered mental status. EXAM: CT HEAD WITHOUT CONTRAST TECHNIQUE: Contiguous axial images were obtained from the base of the skull through the vertex without intravenous contrast. COMPARISON:  October 21, 2016 FINDINGS: Brain: There is mild to moderate severity cerebral atrophy with widening of the extra-axial spaces and ventricular dilatation. There are areas of decreased attenuation within the white matter tracts of the supratentorial brain, consistent with microvascular disease changes. Vascular: No hyperdense vessel or unexpected calcification. Skull: Normal. Negative for fracture or focal lesion. Sinuses/Orbits: No acute finding. Other: None. IMPRESSION: 1. Generalized cerebral atrophy. 2. No acute intracranial abnormality. Electronically Signed   By: Virgina Norfolk M.D.   On: 09/08/2020 20:49   DG Chest Port 1 View  Result Date: 09/08/2020 CLINICAL DATA:  Tremors with nausea and vomiting. EXAM: PORTABLE CHEST 1 VIEW COMPARISON:  April 21, 2020 FINDINGS: Mild, diffuse, chronic appearing increased interstitial lung markings are seen. Mild, stable right apical atelectasis is noted. There is no evidence of a pleural effusion or pneumothorax. The heart size and mediastinal contours are within normal limits. There is mild calcification of the aortic arch. The visualized skeletal structures are unremarkable. IMPRESSION: No active cardiopulmonary  disease. Electronically Signed   By: Virgina Norfolk M.D.   On: 09/08/2020 20:54    Scheduled Meds:  Chlorhexidine Gluconate Cloth  6 each Topical Q0600   enoxaparin (LOVENOX) injection  40 mg Subcutaneous Q24H   mupirocin ointment  1 application Nasal BID   Continuous Infusions:  cefTRIAXone (ROCEPHIN)  IV 1 g (09/09/20 2216)   lactated ringers 75 mL/hr at 09/09/20 2316   magnesium sulfate bolus IVPB       LOS: 1 day   Time spent: 29 minutes   Darliss Cheney, MD [facility]  09/10/2020, 7:52 AM   How to contact the [facility] Attending or Consulting provider [place] or covering provider during after hours [place], for this patient?  Check the care team in [facility] and look for a) attending/consulting [facility] provider listed and b) the [facility] team listed. Page or secure chat [phone]. Log into [email or web address] and use [place]'s universal password to access. If you do not have the password, please contact the hospital operator. Locate the [facility] provider you are looking for under [facility] and page to a number that you can be directly reached. If you still have difficulty reaching the provider, please page the [facility] (Director on Call) for the Hospitalists listed on amion for assistance.

## 2020-09-10 NOTE — Evaluation (Signed)
Physical Therapy Evaluation Patient Details Name: Kimberly Gregory MRN: [id number] DOB: 1935-06-16 Today's Date: 09/10/2020   History of Present Illness  Pt admitted from memory care with UTI/sepsis and with hx of dementia and vertigo  Clinical Impression  Kimberly Gregory is an 85 year old woman admitted to hospital with sepsis and UTI. ON evaluation patient exhibits functional strength but impaired balance, decreased activity tolerance and complaints of pain in back and legs. Unknown prior level of function other than that she comes from a Memory Care unit. On evaluation she required mod assist for bed transfers, min assist with RW and +2 for safety to ambulate in room. Patient needing increased assist more so due to agitation/behaviors and complains of pain than physical deficits. Patient able to ambulate to bathroom, reports needing to use the bathroom then refusing to perform as she was too cold. Overall expect patient will improve. Recommend return to Memory Care unit as a familiar environment is best for that population    Follow Up Recommendations SNF (Return to prior memory care)    Equipment Recommendations  None recommended by PT    Recommendations for Other Services       Precautions / Restrictions Precautions Precautions: Fall Restrictions Weight Bearing Restrictions: No      Mobility  Bed Mobility Overal bed mobility: Needs Assistance Bed Mobility: Supine to Sit;Sit to Supine     Supine to sit: Mod assist;HOB elevated;+2 for safety/equipment Sit to supine: Mod assist;+2 for safety/equipment   General bed mobility comments: MOd assist for tactile cues to transfer to side of bed - more to elicit participation. Mod assist for LEs to transfer back to bed.    Transfers Overall transfer level: Needs assistance Equipment used: Rolling walker (2 wheeled) Transfers: Sit to/from Stand Sit to Stand: Min assist;+2 safety/equipment         General transfer comment: MIn  assist with RW and +2 for safety due to patient's dementia and behaviors. MIn assist for steadying and walker management. ABle to ambulate into bathroom but refused toilet transfer. Returned to bed. Left walker behind and used +2 hand holds. Mildly unsteady with one hand hold.  Ambulation/Gait Ambulation/Gait assistance: Min assist;+2 safety/equipment Gait Distance (Feet): 30 Feet Assistive device: Rolling walker (2 wheeled);1 person hand held assist Gait Pattern/deviations: Step-through pattern;Decreased step length - right;Decreased step length - left;Shuffle;Trunk flexed Gait velocity: decr   General Gait Details: general instability but no LOB  Stairs            Wheelchair Mobility    Modified Rankin (Stroke Patients Only)       Balance Overall balance assessment: Needs assistance Sitting-balance support: No upper extremity supported;Feet supported Sitting balance-Leahy Scale: Fair     Standing balance support: Bilateral upper extremity supported Standing balance-Leahy Scale: Fair                               Pertinent Vitals/Pain Pain Assessment: Faces Faces Pain Scale: Hurts little more Pain Location: back and legs with mobility Pain Descriptors / Indicators: Sore Pain Intervention(s): Limited activity within patient's tolerance;Monitored during session    Home Living Family/patient expects to be discharged to:: Skilled nursing facility                 Additional Comments: RW, wc from previous stay    Prior Function           Comments: Pt not reliable historian  Hand Dominance   Dominant Hand: Right    Extremity/Trunk Assessment   Upper Extremity Assessment Upper Extremity Assessment: Overall WFL for tasks assessed    Lower Extremity Assessment Lower Extremity Assessment: Overall WFL for tasks assessed    Cervical / Trunk Assessment Cervical / Trunk Assessment: Normal  Communication   Communication: No difficulties   Cognition Arousal/Alertness: Awake/alert Behavior During Therapy: Flat affect;Anxious Overall Cognitive Status: History of cognitive impairments - at baseline                                 General Comments: ALert to self only.      General Comments      Exercises     Assessment/Plan    PT Assessment Patient needs continued PT services  PT Problem List Decreased activity tolerance;Decreased balance;Decreased mobility;Decreased knowledge of use of DME;Pain       PT Treatment Interventions DME instruction;Gait training;Functional mobility training;Therapeutic activities;Cognitive remediation;Balance training    PT Goals (Current goals can be found in the Care Plan section)  Acute Rehab PT Goals Patient Stated Goal: Back to bed PT Goal Formulation: Patient unable to participate in goal setting Time For Goal Achievement: 09/24/20 Potential to Achieve Goals: Good    Frequency Min 2X/week   Barriers to discharge        Co-evaluation PT/OT/SLP Co-Evaluation/Treatment: Yes Reason for Co-Treatment: To address functional/ADL transfers PT goals addressed during session: Mobility/safety with mobility OT goals addressed during session: ADL's and self-care       AM-PAC PT "6 Clicks" Mobility  Outcome Measure Help needed turning from your back to your side while in a flat bed without using bedrails?: A Little Help needed moving from lying on your back to sitting on the side of a flat bed without using bedrails?: A Lot Help needed moving to and from a bed to a chair (including a wheelchair)?: A Lot Help needed standing up from a chair using your arms (e.g., wheelchair or bedside chair)?: A Little Help needed to walk in hospital room?: A Little Help needed climbing 3-5 steps with a railing? : A Lot 6 Click Score: 15    End of Session Equipment Utilized During Treatment: Gait belt Activity Tolerance: Patient limited by pain Patient left: in bed;with call  bell/phone within reach;with bed alarm set;with restraints reapplied Nurse Communication: Mobility status PT Visit Diagnosis: Unsteadiness on feet (R26.81);Difficulty in walking, not elsewhere classified (R26.2);Pain    Time: 2620-3559 PT Time Calculation (min) (ACUTE ONLY): 24 min   Charges:   PT Evaluation $PT Eval Low Complexity: 1 Low          Mauro Kaufmann PT Acute Rehabilitation Services Pager [phone] Office [phone]   Latish Toutant 09/10/2020, 12:20 PM

## 2020-09-10 NOTE — Evaluation (Signed)
Occupational Therapy Evaluation Patient Details Name: Kimberly Gregory MRN: [id number] DOB: 1935-08-02 Today's Date: 09/10/2020    History of Present Illness Pt admitted from memory care with UTI/sepsis and with hx of dementia and vertigo   Clinical Impression   [name] is an 85 year old woman admitted to hospital with sepsis and UTI. ON evaluation patient exhibits functional strength but impaired balance, decreased activity tolerance and complaints of pain in back and legs. Unknown prior level of function other than that she comes from a Memory Care unit. On evaluation she required mod assist for bed transfers, min assist with RW and +2 for safety to ambulate in room. Patient needing mod-max assist for most ADLs more so due to agitation/behaviors and complains of pain than physical deficits. Patient able to ambulate to bathroom, reports needing to use the bathroom then refusing to perform as she was too cold. Overall expect patient will improve. Recommend return to Memory Care unit as a familiar environment is best for that population.     Follow Up Recommendations  Supervision/Assistance - 24 hour;Home health OT;Other (comment) (Return to Memory Care Unit)    Equipment Recommendations  None recommended by OT    Recommendations for Other Services       Precautions / Restrictions Precautions Precautions: Fall Restrictions Weight Bearing Restrictions: No      Mobility Bed Mobility Overal bed mobility: Needs Assistance Bed Mobility: Supine to Sit;Sit to Supine     Supine to sit: Mod assist;HOB elevated;+2 for safety/equipment Sit to supine: Mod assist;+2 for safety/equipment   General bed mobility comments: MOd assist for tactile cues to transfer to side of bed - more to elicit participation. Mod assist for LEs to transfer back to bed.    Transfers Overall transfer level: Needs assistance Equipment used: Rolling walker (2 wheeled) Transfers: Sit to/from Stand Sit to  Stand: Min assist;+2 safety/equipment         General transfer comment: MIn assist with RW and +2 for safety due to patient's dementia and behaviors. MIn assist for steadying and walker management. ABle to ambulate into bathroom but refused toilet transfer. Returned to bed. Left walker behind and used +2 hand holds. Mildly unsteady with one hand hold.    Balance Overall balance assessment: Needs assistance Sitting-balance support: No upper extremity supported;Feet supported Sitting balance-Leahy Scale: Fair     Standing balance support: Bilateral upper extremity supported Standing balance-Leahy Scale: Poor                             ADL either performed or assessed with clinical judgement   ADL Overall ADL's : Needs assistance/impaired Eating/Feeding: Set up;Cueing for sequencing;Sitting   Grooming: Set up;Cueing for sequencing;Sitting   Upper Body Bathing: Cueing for sequencing;Minimal assistance;Set up;Sitting   Lower Body Bathing: Maximal assistance;Cueing for sequencing;+2 for safety/equipment;+2 for physical assistance;Sit to/from stand   Upper Body Dressing : Moderate assistance;Sitting   Lower Body Dressing: Maximal assistance;+2 for physical assistance;+2 for safety/equipment;Sit to/from stand   Toilet Transfer: Minimal assistance;+2 for safety/equipment;Ambulation;Regular Toilet;RW Toilet Transfer Details (indicate cue type and reason): ambulated into bathroom - did not actually perform but exhibited the physical ability too. Limited by agitation/dementia. Toileting- Clothing Manipulation and Hygiene: Maximal assistance;Sit to/from stand;+2 for safety/equipment       Functional mobility during ADLs: +2 for safety/equipment;Minimal assistance;Rolling walker       Vision   Vision Assessment?: No apparent visual deficits  Perception     Praxis      Pertinent Vitals/Pain Pain Assessment: Faces Faces Pain Scale: Hurts little more Pain Location:  back and legs with mobility Pain Descriptors / Indicators: Sore Pain Intervention(s): Limited activity within patient's tolerance;Monitored during session     Hand Dominance Right   Extremity/Trunk Assessment Upper Extremity Assessment Upper Extremity Assessment: Overall WFL for tasks assessed   Lower Extremity Assessment Lower Extremity Assessment: Overall WFL for tasks assessed   Cervical / Trunk Assessment Cervical / Trunk Assessment: Normal   Communication Communication Communication: No difficulties   Cognition Arousal/Alertness: Awake/alert Behavior During Therapy: Flat affect;Anxious Overall Cognitive Status: History of cognitive impairments - at baseline                                 General Comments: ALert to self only.   General Comments       Exercises     Shoulder Instructions      Home Living Family/patient expects to be discharged to:: Skilled nursing facility                                 Additional Comments: RW, wc from previous stay      Prior Functioning/Environment          Comments: Pt not reliable historian        OT Problem List: Decreased activity tolerance;Impaired balance (sitting and/or standing);Decreased cognition;Decreased safety awareness;Decreased knowledge of use of DME or AE      OT Treatment/Interventions: Self-care/ADL training;DME and/or AE instruction;Therapeutic activities;Patient/family education;Balance training    OT Goals(Current goals can be found in the care plan section) Acute Rehab OT Goals OT Goal Formulation: Patient unable to participate in goal setting Time For Goal Achievement: 09/24/20 Potential to Achieve Goals: Fair  OT Frequency: Min 2X/week   Barriers to D/C:            Co-evaluation PT/OT/SLP Co-Evaluation/Treatment: Yes Reason for Co-Treatment: To address functional/ADL transfers PT goals addressed during session: Mobility/safety with mobility OT goals  addressed during session: ADL's and self-care      AM-PAC OT "6 Clicks" Daily Activity     Outcome Measure Help from another person eating meals?: A Little Help from another person taking care of personal grooming?: A Little Help from another person toileting, which includes using toliet, bedpan, or urinal?: A Lot Help from another person bathing (including washing, rinsing, drying)?: A Lot Help from another person to put on and taking off regular upper body clothing?: A Lot Help from another person to put on and taking off regular lower body clothing?: A Lot 6 Click Score: 14   End of Session Equipment Utilized During Treatment: Rolling walker  Activity Tolerance: Patient limited by fatigue;Patient limited by pain;Treatment limited secondary to agitation Patient left: in bed;with call bell/phone within reach;with bed alarm set;Other (comment) (safety mittens reapplied)  OT Visit Diagnosis: Unsteadiness on feet (R26.81);Pain                Time: 6606-3016 OT Time Calculation (min): 17 min Charges:  OT General Charges $OT Visit: 1 Visit OT Evaluation $OT Eval Low Complexity: 1 Low  Braidan Ricciardi, OTR/L Acute Care Rehab Services  Office [phone] Pager: [phone]   Kelli Churn 09/10/2020, 11:13 AM

## 2020-09-10 NOTE — Plan of Care (Addendum)
  Problem: Elimination: Goal: Will not experience complications related to urinary retention 09/10/2020 0801 by Amalia Hailey, RN Outcome: Progressing 09/10/2020 0342 by Amalia Hailey, RN Outcome: Progressing   Problem: Pain Managment: Goal: General experience of comfort will improve 09/10/2020 0801 by Amalia Hailey, RN Outcome: Progressing 09/10/2020 0342 by Amalia Hailey, RN Outcome: Progressing   Problem: Safety: Goal: Ability to remain free from injury will improve 09/10/2020 0801 by Amalia Hailey, RN Outcome: Progressing 09/10/2020 0342 by Amalia Hailey, RN Outcome: Progressing   Problem: Skin Integrity: Goal: Risk for impaired skin integrity will decrease 09/10/2020 0801 by Amalia Hailey, RN Outcome: Progressing 09/10/2020 0342 by Amalia Hailey, RN Outcome: Progressing   Problem: Clinical Measurements: Goal: Diagnostic test results will improve Outcome: Progressing   Problem: Respiratory: Goal: Ability to maintain adequate ventilation will improve Outcome: Progressing

## 2020-09-10 NOTE — Progress Notes (Signed)
   09/10/20 0715  Assess: MEWS Score  Temp (!) 103 F (39.4 C)  BP 115/61  Pulse Rate 90  Resp 20  Level of Consciousness Alert  SpO2 93 %  O2 Device Room Air  Assess: MEWS Score  MEWS Temp 2  MEWS Systolic 0  MEWS Pulse 0  MEWS RR 0  MEWS LOC 0  MEWS Score 2  MEWS Score Color Yellow  Assess: if the MEWS score is Yellow or Red  Were vital signs taken at a resting state? Yes  Focused Assessment No change from prior assessment  Does the patient meet 2 or more of the SIRS criteria? No  MEWS guidelines implemented *See Row Information* Yes  Treat  MEWS Interventions Administered prn meds/treatments  Complains of Fever  Interventions Medication (see MAR)  Take Vital Signs  Increase Vital Sign Frequency  Yellow: Q 2hr X 2 then Q 4hr X 2, if remains yellow, continue Q 4hrs  Notify: Charge Nurse/RN  Name of Charge Nurse/RN Notified Festus Barren, RN  Date Charge Nurse/RN Notified 09/10/20  Time Charge Nurse/RN Notified 0720  Notify: Provider  Provider Name/Title R. Pahwani (Attending)  Date Provider Notified 09/10/20  Time Provider Notified [phone]  Notification Type Page  Notification Reason Other (Comment) (Pt has a fever)  Assess: SIRS CRITERIA  SIRS Temperature  1  SIRS Pulse 0  SIRS Respirations  0  SIRS WBC 0  SIRS Score Sum  1

## 2020-09-10 NOTE — Progress Notes (Signed)
Initial Nutrition Assessment  DOCUMENTATION CODES:   Not applicable  INTERVENTION:  - will order Boost Breeze once/day, each supplement provides 250 kcal and 9 grams of protein. - will order Ensure Enlive once/day, each supplement provides 350 kcal and 20 grams of protein. - will order Magic Cup lunch with meals, each supplement provides 290 kcal and 9 grams of protein. - will order 1 tablet multivitamin with minerals/day.   NUTRITION DIAGNOSIS:   Increased nutrient needs related to acute illness as evidenced by estimated needs.  GOAL:   Patient will meet greater than or equal to 90% of their needs  MONITOR:   PO intake, Supplement acceptance, Labs, Weight trends  REASON FOR ASSESSMENT:   Malnutrition Screening Tool  ASSESSMENT:   85 year old female with history of severe dementia, HTN, hypercholesterolemia, CKD, arthritis, ischemic colitis, laxative abuse, and headaches. She presented to the ED from ALF d/t N/V. She was dx with sepsis 2/2 UTI.  She ate 50% of lunch yesterday and 10% of breakfast this AM. Daughter at bedside reports that patient does not like what she received for lunch so family is going to re-order her lunch.   Patient laying in bed and noted to be disoriented x4.  Daughter reports that patient is from a Memory Care unit. She is unsure of if patient has had any recent changes in appetite. Reports that she has dentures (not brought to the hospital) that fit very well.  Weight yesterday was 148 lb and weight appears to be mainly stable for the past 2 years.   Per notes: - severe sepsis 2/2 UTI - hx of stage 3 CKD - hx of advanced dementia   Labs reviewed; Ca: 8 mg/dl, Mg: 1.5 mg/dl, GFR: 59 ml/min. Medications reviewed; 2 g IV Mg sulfate/day, 3 mg melatonin/night. IVF; LR @ 75 ml/hr.    NUTRITION - FOCUSED PHYSICAL EXAM:  Completed; no muscle or fat wasting, mild edema to BLE.   Diet Order:   Diet Order             DIET SOFT Room service  appropriate? Yes; Fluid consistency: Thin  Diet effective now                   EDUCATION NEEDS:   No education needs have been identified at this time  Skin:  Skin Assessment: Reviewed RN Assessment  Last BM:  PTA/unknown  Height:   Ht Readings from Last 1 Encounters:  09/09/20 5\' 1"  (1.549 m)    Weight:   Wt Readings from Last 1 Encounters:  09/09/20 67 kg      Estimated Nutritional Needs:  Kcal:  1650-1850 kcal Protein:  75-90 grams Fluid:  >/= 1.8 L/day       09/11/20, MS, RD, LDN, CNSC Inpatient Clinical Dietitian RD pager # available in AMION  After hours/weekend pager # available in [facility]

## 2020-09-10 NOTE — Plan of Care (Signed)

## 2020-09-11 DIAGNOSIS — N1831 Chronic kidney disease, stage 3a: Secondary | ICD-10-CM

## 2020-09-11 DIAGNOSIS — I951 Orthostatic hypotension: Secondary | ICD-10-CM

## 2020-09-11 DIAGNOSIS — R112 Nausea with vomiting, unspecified: Secondary | ICD-10-CM

## 2020-09-11 DIAGNOSIS — F0391 Unspecified dementia with behavioral disturbance: Secondary | ICD-10-CM

## 2020-09-11 DIAGNOSIS — N3001 Acute cystitis with hematuria: Secondary | ICD-10-CM

## 2020-09-11 LAB — URINE CULTURE: Culture: >=100000 — AB

## 2020-09-11 LAB — BASIC METABOLIC PANEL
Anion gap: 8 (ref 5–15)
BUN: 16 mg/dL (ref 8–23)
CO2: 26 mmol/L (ref 22–32)
Calcium: 7.8 mg/dL — ABNORMAL LOW (ref 8.9–10.3)
Chloride: 103 mmol/L (ref 98–111)
Creatinine, Ser: 0.85 mg/dL (ref 0.44–1.00)
GFR, Estimated: >60 mL/min (ref >60)
Glucose, Bld: 94 mg/dL (ref 70–99)
Potassium: 3.8 mmol/L (ref 3.5–5.1)
Sodium: 137 mmol/L (ref 135–145)

## 2020-09-11 LAB — CBC WITH DIFFERENTIAL/PLATELET
Abs Immature Granulocytes: 0.03 10*3/uL (ref 0.00–0.07)
Basophils Absolute: 0 10*3/uL (ref 0.0–0.1)
Basophils Relative: 0 %
Eosinophils Absolute: 0 10*3/uL (ref 0.0–0.5)
Eosinophils Relative: 0 %
HCT: 26 % — ABNORMAL LOW (ref 36.0–46.0)
Hemoglobin: 8.8 g/dL — ABNORMAL LOW (ref 12.0–15.0)
Immature Granulocytes: 1 %
Lymphocytes Relative: 24 %
Lymphs Abs: 1.5 10*3/uL (ref 0.7–4.0)
MCH: 25.7 pg — ABNORMAL LOW (ref 26.0–34.0)
MCHC: 33.8 g/dL (ref 30.0–36.0)
MCV: 76 fL — ABNORMAL LOW (ref 80.0–100.0)
Monocytes Absolute: 0.9 10*3/uL (ref 0.1–1.0)
Monocytes Relative: 14 %
Neutro Abs: 3.8 10*3/uL (ref 1.7–7.7)
Neutrophils Relative %: 61 %
Platelets: 226 10*3/uL (ref 150–400)
RBC: 3.42 MIL/uL — ABNORMAL LOW (ref 3.87–5.11)
RDW: 18.9 % — ABNORMAL HIGH (ref 11.5–15.5)
WBC: 6.2 10*3/uL (ref 4.0–10.5)
nRBC: 0 % (ref 0.0–0.2)

## 2020-09-11 LAB — MAGNESIUM: Magnesium: 1.9 mg/dL (ref 1.7–2.4)

## 2020-09-11 MED ORDER — CEFAZOLIN SODIUM-DEXTROSE 1-4 GM/50ML-% IV SOLN
1.0000 g | Freq: Three times a day (TID) | INTRAVENOUS | Status: DC
  Administered 2020-09-11 – 2020-09-12 (×2): 1 g via INTRAVENOUS
  Filled 2020-09-11 (×3): qty 50

## 2020-09-11 NOTE — Progress Notes (Signed)
PROGRESS NOTE  Kimberly Gregory FFM:[id number] DOB: 02/24/1936 DOA: 09/08/2020 PCP: Unk Pinto, MD  Brief History   85 year old female with history of severe dementia, hypertension and multiple other problems was brought into the ED from assisted living facility with nausea and vomiting and was eventually diagnosed with sepsis secondary to UTI.  In fact she meets criteria for severe sepsis based on tachycardia, fever tachypnea and low blood pressure, her lowest blood pressure was 94/48.  She also had lactic acid of 2 which has resolved after she received IV fluids.  She was diagnosed with UTI and was started on antibiotics.  Has elevated procalcitonin, leukocytosis is stable.  Slightly elevated procalcitonin.    Consultants  None  Procedures  None  Antibiotics          Anti-infectives (From admission, onward)    Start     Dose/Rate Route Frequency Ordered Stop   09/11/20 2200  ceFAZolin (ANCEF) IVPB 1 g/50 mL premix        1 g 100 mL/hr over 30 Minutes Intravenous Every 8 hours 09/11/20 1358     09/09/20 2200  cefTRIAXone (ROCEPHIN) 1 g in sodium chloride 0.9 % 100 mL IVPB  Status:  Discontinued        1 g 200 mL/hr over 30 Minutes Intravenous Every 24 hours 09/09/20 0455 09/11/20 1357   09/08/20 2315  cefTRIAXone (ROCEPHIN) 1 g in sodium chloride 0.9 % 100 mL IVPB        1 g 200 mL/hr over 30 Minutes Intravenous  Once 09/08/20 2302 09/09/20 0042   09/08/20 0000  cephALEXin (KEFLEX) 500 MG capsule        500 mg Oral 4 times daily 09/08/20 2303        Subjective  Patient is resting quietly with her family at bedside. She had a fever of 101.6 on the evening of 09/10/2020. No new complaints.  Objective   Vitals:  Vitals:   09/11/20 0542 09/11/20 1423  BP:  (!) 117/96  Pulse:  72  Resp:  18  Temp: 98 F (36.7 C) 98.5 F (36.9 C)  SpO2:      Exam:  Constitutional:  Appears calm and comfortable. Pleasantly confused. Respiratory:  CTA bilaterally, no w/r/r.   Respiratory effort normal. No retractions or accessory muscle use Cardiovascular:  RRR, no m/r/g No LE extremity edema   Normal pedal pulses Abdomen:  Abdomen appears normal; no tenderness or masses No hernias No HSM Musculoskeletal:  Digits/nails BUE: no clubbing, cyanosis, petechiae, infection exam of joints, bones, muscles of at least one of following: head/neck, RUE, LUE, RLE, LLE   strength and tone normal, no atrophy, no abnormal movements No tenderness, masses Normal ROM, no contractures  gait and station Skin:  No rashes, lesions, ulcers palpation of skin: no induration or nodules Neurologic:  CN 2-12 intact Sensation all 4 extremities intact Psychiatric:  Mental status: Mood and affect are congruent. Patient is oriented to person.   I have personally reviewed the following:   Today's Data   Vitals:   09/11/20 0542 09/11/20 1423  BP:  (!) 117/96  Pulse:  72  Resp:  18  Temp: 98 F (36.7 C) 98.5 F (36.9 C)  SpO2:     Lab Data  CBC    Component Value Date/Time   WBC 6.2 09/11/2020 0757   RBC 3.42 (L) 09/11/2020 0757   HGB 8.8 (L) 09/11/2020 0757   HCT 26.0 (L) 09/11/2020 0757   PLT 226 09/11/2020 0757  MCV 76.0 (L) 09/11/2020 0757   MCH 25.7 (L) 09/11/2020 0757   MCHC 33.8 09/11/2020 0757   RDW 18.9 (H) 09/11/2020 0757   LYMPHSABS 1.5 09/11/2020 0757   MONOABS 0.9 09/11/2020 0757   EOSABS 0.0 09/11/2020 0757   BASOSABS 0.0 09/11/2020 0757   BMP Latest Ref Rng & Units 09/11/2020 09/10/2020 09/09/2020  Glucose 70 - 99 mg/dL 94 81 109(H)  BUN 8 - 23 mg/dL _0 Creatinine 0.44 - 1.00 mg/dL 0.85 0.96 0.90  BUN/Creat Ratio 6 - 22 (calc) - - -  Sodium 135 - 145 mmol/L 137 139 141  Potassium 3.5 - 5.1 mmol/L 3.8 4.0 3.7  Chloride 98 - 111 mmol/L 103 108 111  CO2 22 - 32 mmol/L _1 Calcium 8.9 - 10.3 mg/dL 7.8(L) 8.0(L) 8.1(L)   Micro Data  Urine culture has grown out pan sensitive E.coli.  Blood culture x 2 has had no growth.  Imaging   CT head: Generalized cerebral atrophy. No acute intracranial abnormalities  Cardiology Data  Unchanged from previous  Scheduled Meds:  ARIPiprazole  2 mg Oral Daily   Chlorhexidine Gluconate Cloth  6 each Topical Q0600   divalproex  250 mg Oral TID   enoxaparin (LOVENOX) injection  40 mg Subcutaneous Q24H   feeding supplement  1 Container Oral Q24H   feeding supplement  237 mL Oral Q24H   melatonin  3 mg Oral QHS   multivitamin with minerals  1 tablet Oral Daily   mupirocin ointment  1 application Nasal BID   sertraline  25 mg Oral Daily   Continuous Infusions:   ceFAZolin (ANCEF) IV     lactated ringers 75 mL/hr at 09/10/20 9622    Principal Problem:   Sepsis ([place]) Active Problems:   Dementia with behavioral disturbance (HCC)   Urinary tract infection with hematuria   Anemia   Nausea and vomiting   Hypotension   CKD (chronic kidney disease) stage 3, GFR 30-59 ml/min (HCC)   LOS: 2 days    A & P    Severe sepsis secondary to UTI: Now resolved. She met criteria for severe sepsis based on fever, tachycardia, tachypnea and hypotension with initial blood pressure of 94/48.  Cultures negative.  She had fever of 103 this morning again.  No leukocytosis.  Continue Rocephin and follow culture.  Observe until fever free for at least 24 hours. Last fever was 101.7 at 1800 on 09/10/2020.   Hypomagnesemia: Replete   CKD stage IIIa: At baseline.  Monitor.   Hypotension: Resolved.  Monitor closely.   CKD stage IIIa: Stable at baseline.   Advanced dementia: No behavioral disturbances.  Continue current management.   Chronic anemia: Stable.   DVT prophylaxis: enoxaparin (LOVENOX) injection 40 mg Start: 09/09/20 1000   Code Status: Full Code  Family Communication: None present at bedside.     Status is: Inpatient   Remains inpatient appropriate because:Inpatient level of care appropriate due to severity of illness   Dispo: The patient is from: ALF              Anticipated  d/c is to: ALF              Patient currently is not medically stable to d/c.              Difficult to place patient No   Cailean Heacock, DO [facility] Direct contact: see [email or web address]  7PM-7AM contact night coverage as above 09/11/2020, 3:25 PM  LOS: 2 days

## 2020-09-12 DIAGNOSIS — R6520 Severe sepsis without septic shock: Secondary | ICD-10-CM

## 2020-09-12 DIAGNOSIS — A4151 Sepsis due to Escherichia coli [E. coli]: Secondary | ICD-10-CM

## 2020-09-12 DIAGNOSIS — D508 Other iron deficiency anemias: Secondary | ICD-10-CM

## 2020-09-12 MED ORDER — ONDANSETRON HCL 4 MG PO TABS: 4.0000 mg | ORAL_TABLET | Freq: Three times a day (TID) | ORAL | 0 refills | Status: AC | PRN

## 2020-09-12 MED ORDER — ADULT MULTIVITAMIN W/MINERALS CH: 1.0000 | ORAL_TABLET | Freq: Every day | ORAL | 0 refills | Status: AC

## 2020-09-12 MED ORDER — ENSURE ENLIVE PO LIQD: 237.0000 mL | ORAL | 12 refills | Status: AC

## 2020-09-12 MED ORDER — CEPHALEXIN 500 MG PO CAPS: 500.0000 mg | ORAL_CAPSULE | Freq: Four times a day (QID) | ORAL | 0 refills | Status: AC

## 2020-09-12 NOTE — Progress Notes (Signed)
Discharge report called to [place] at [facility]. AVS and prescriptions given to dtr. Dtr to transport pt via private car. Condition stable.Melton Alar, RN

## 2020-09-12 NOTE — TOC Transition Note (Signed)
CSW notified of patient's readiness for discharge. [facility] agreeable to accept patient back to facility today. CSW faxed d/c summary and FL2. Nurse to call report. Family to transport patient. TOC signing off.

## 2020-09-12 NOTE — Discharge Summary (Signed)
Physician Discharge Summary  Kimberly Gregory RNH:[id number] DOB: [birth year] DOA: 09/08/2020  PCP: Lucky Cowboy, MD  Admit date: 09/08/2020 Discharge date: 09/12/2020  Recommendations for Outpatient Follow-up:  Follow up with PCP in 7-10 days.   Follow-up Information     Schedule an appointment as soon as possible for a visit  with Lucky Cowboy, MD.   Specialty: Internal Medicine Why: For recheck of your symptoms Contact information: [address] [place] [address] [phone]                  Discharge Diagnoses: Principal diagnosis is #1 Sepsis secondary to UTI Hypomagnesemia CKDIIIa Hypotension Advanced dementia Chronic anemia  Discharge Condition: Fair Disposition: Return to memory care facility  Diet recommendation: Regular  Filed Weights   09/09/20 2220  Weight: 67 kg    History of present illness:   Kimberly Gregory is a 85 y.o. female with medical history significant for Dementia, HTN, HLD, GERD, OA who presents to [facility] from assisted living facility with report of nausea, vomiting and shaking.  The staff had reported to the ER that patient has had these type of symptoms before with a UTI.  She had any dysuria or urinary frequency.  She did have multiple episodes of vomiting at the facility.  In the emergency room she was given Zofran and vomiting was controlled.  Initially she was given to be discharged back to the assisted living facility when she developed a fever and her blood pressure dropped.  It was then felt that patient probably had sepsis and was kept in the ER for continued work-up.  Lactic acid was mildly elevated at 2.0.  Blood pressure improved with IV fluid hydration.  ER physician discussed with covering hospitalist, Dr. Margo Aye, who accept the patient in transfer for admission for sepsis and UTI.   ED Course: Kimberly Gregory had low blood pressure down to 94/48 in the emergency room.  Blood pressure improved after IV  fluid hydration.  She also had a T-max of 103.2 degrees.  She was given Tylenol.  She was given IV fluid bolus and continued on LR IV fluid infusion.  Urinalysis came back positive for UTI.  Patient was started on Rocephin.  X-ray was negative for infiltrate or consolidation.  Labs revealed a sodium 140, potassium 3.8, chloride 106, bicarb 27, creatinine 1.14, BUN 29, normal LFTs, glucose 111.  WBC was 10,500, hemoglobin 9.6, hematocrit 20.2, platelets are 53,000.  Lactic acid was 2.0.  Hospitalist service was asked to admit for further management.  Patient was accepted to progressive care bed at [facility] Course:  The patient was admitted. She was given IV ceftriaxone and magnesium supplementation. Her urine culture has grown out pan sensitive E.coli. She was changed to ancef on 09/11/2020. Today the patient will be discharged to home on keflex.   Today's assessment: S: The patient is awake and interactive. No new complaints. O: Vitals:  Vitals:   09/11/20 2055 09/12/20 0521  BP: (!) 122/55 124/63  Pulse: 76 64  Resp: 18 20  Temp: 99 F (37.2 C) 98.6 F (37 C)  SpO2: 100% 99%    Exam:  Constitutional:  The patient is awake and alert. Not oriented. No acute distress. Respiratory:  No increased work of breathing. No wheezes, rales, or rhonchi No tactile fremitus Cardiovascular:  Regular rate and rhythm No murmurs, ectopy, or gallups. No lateral PMI. No thrills. Abdomen:  Abdomen is soft, non-tender, non-distended  No hernias, masses, or organomegaly Normoactive bowel sounds.  Musculoskeletal:  No cyanosis, clubbing, or edema Skin:  No rashes, lesions, ulcers palpation of skin: no induration or nodules Neurologic:  CN 2-12 intact Sensation all 4 extremities intact  Discharge Instructions  Discharge Instructions     Activity as tolerated - No restrictions   Complete by: As directed    Call MD for:  difficulty breathing, headache or visual  disturbances   Complete by: As directed    Call MD for:  persistant nausea and vomiting   Complete by: As directed    Call MD for:  severe uncontrolled pain   Complete by: As directed    Call MD for:  temperature >100.4   Complete by: As directed    Diet - low sodium heart healthy   Complete by: As directed    Diet general   Complete by: As directed    Discharge instructions   Complete by: As directed    Encourage pt to drink water. Avoid caffeinated and sugared drinks. Follow up with PCP in 7-10 days.   Increase activity slowly   Complete by: As directed       Allergies as of 09/12/2020   No Known Allergies      Medication List     STOP taking these medications    OLANZapine 10 MG tablet Commonly known as: ZYPREXA       TAKE these medications    ARIPiprazole 2 MG tablet Commonly known as: ABILIFY Take 2 mg by mouth daily.   cephALEXin 500 MG capsule Commonly known as: KEFLEX Take 1 capsule (500 mg total) by mouth 4 (four) times daily for 2 days.   divalproex 125 MG capsule Commonly known as: DEPAKOTE SPRINKLE Take 250 mg by mouth 3 (three) times daily.   feeding supplement Liqd Take 237 mLs by mouth daily.   melatonin 3 MG Tabs tablet Take 3 mg by mouth at bedtime.   multivitamin with minerals Tabs tablet Take 1 tablet by mouth daily.   ondansetron 4 MG tablet Commonly known as: ZOFRAN Take 1 tablet (4 mg total) by mouth every 8 (eight) hours as needed for nausea or vomiting.   sertraline 25 MG tablet Commonly known as: ZOLOFT Take 25 mg by mouth daily.   Vitamin D3 1.25 MG (50000 UT) Caps Take 1 capsule 3  /week for Vitamin D Deficiency        No Known Allergies  The results of significant diagnostics from this hospitalization (including imaging, microbiology, ancillary and laboratory) are listed below for reference.    Significant Diagnostic Studies: CT Head Wo Contrast  Result Date: 09/08/2020 CLINICAL DATA:  Altered mental status.  EXAM: CT HEAD WITHOUT CONTRAST TECHNIQUE: Contiguous axial images were obtained from the base of the skull through the vertex without intravenous contrast. COMPARISON:  October 21, 2016 FINDINGS: Brain: There is mild to moderate severity cerebral atrophy with widening of the extra-axial spaces and ventricular dilatation. There are areas of decreased attenuation within the white matter tracts of the supratentorial brain, consistent with microvascular disease changes. Vascular: No hyperdense vessel or unexpected calcification. Skull: Normal. Negative for fracture or focal lesion. Sinuses/Orbits: No acute finding. Other: None. IMPRESSION: 1. Generalized cerebral atrophy. 2. No acute intracranial abnormality. Electronically Signed   By: Aram Candela M.D.   On: 09/08/2020 20:49   DG Chest Port 1 View  Result Date: 09/08/2020 CLINICAL DATA:  Tremors with nausea and vomiting. EXAM: PORTABLE CHEST 1 VIEW COMPARISON:  April 21, 2020 FINDINGS: Mild, diffuse, chronic appearing increased interstitial lung markings are seen. Mild, stable right apical atelectasis is noted. There is no evidence of a pleural effusion or pneumothorax. The heart size and mediastinal contours are within normal limits. There is mild calcification of the aortic arch. The visualized skeletal structures are unremarkable. IMPRESSION: No active cardiopulmonary disease. Electronically Signed   By: Aram Candela M.D.   On: 09/08/2020 20:54    Microbiology: Recent Results (from the past 240 hour(s))  Resp Panel by RT-PCR (Flu A&B, Covid) Nasopharyngeal Swab     Status: None   Collection Time: 09/08/20  8:59 PM   Specimen: Nasopharyngeal Swab; Nasopharyngeal(NP) swabs in vial transport medium  Result Value Ref Range Status   SARS Coronavirus 2 by RT PCR NEGATIVE NEGATIVE Final    Comment: (NOTE) SARS-CoV-2 target nucleic acids are NOT DETECTED.  The SARS-CoV-2 RNA is generally detectable in upper respiratory specimens during the acute  phase of infection. The lowest concentration of SARS-CoV-2 viral copies this assay can detect is 138 copies/mL. A negative result does not preclude SARS-Cov-2 infection and should not be used as the sole basis for treatment or other patient management decisions. A negative result may occur with  improper specimen collection/handling, submission of specimen other than nasopharyngeal swab, presence of viral mutation(s) within the areas targeted by this assay, and inadequate number of viral copies(<138 copies/mL). A negative result must be combined with clinical observations, patient history, and epidemiological information. The expected result is Negative.  Fact Sheet for Patients:  BloggerCourse.com  Fact Sheet for Healthcare Providers:  SeriousBroker.it  This test is no t yet approved or cleared by the [place] FDA and  has been authorized for detection and/or diagnosis of SARS-CoV-2 by FDA under an Emergency Use Authorization (EUA). This EUA will remain  in effect (meaning this test can be used) for the duration of the COVID-19 declaration under Section 564(b)(1) of the Act, 21 U.S.C.section 360bbb-3(b)(1), unless the authorization is terminated  or revoked sooner.       Influenza A by PCR NEGATIVE NEGATIVE Final   Influenza B by PCR NEGATIVE NEGATIVE Final    Comment: (NOTE) The Xpert Xpress SARS-CoV-2/FLU/RSV plus assay is intended as an aid in the diagnosis of influenza from Nasopharyngeal swab specimens and should not be used as a sole basis for treatment. Nasal washings and aspirates are unacceptable for Xpert Xpress SARS-CoV-2/FLU/RSV testing.  Fact Sheet for Patients: BloggerCourse.com  Fact Sheet for Healthcare Providers: SeriousBroker.it  This test is not yet approved or cleared by the [place] FDA and has been authorized for detection and/or diagnosis of  SARS-CoV-2 by FDA under an Emergency Use Authorization (EUA). This EUA will remain in effect (meaning this test can be used) for the duration of the COVID-19 declaration under Section 564(b)(1) of the Act, 21 U.S.C. section 360bbb-3(b)(1), unless the authorization is terminated or revoked.  Performed at [facility], [address], [place], [place] [address]   Urine culture     Status: Abnormal   Collection Time: 09/08/20 10:35 PM   Specimen: Urine, Catheterized  Result Value Ref Range Status   Specimen Description   Final    URINE, CATHETERIZED Performed at [facility], [address] [place], [place] [address]    Special Requests   Final    NONE Performed at [facility], [address] [place], [place] [address]    Culture >=100,000 COLONIES/mL ESCHERICHIA COLI (A)  Final   Report Status  09/11/2020 FINAL  Final   Organism ID, Bacteria ESCHERICHIA COLI (A)  Final      Susceptibility   Escherichia coli - MIC*    AMPICILLIN <=2 SENSITIVE Sensitive     CEFAZOLIN <=4 SENSITIVE Sensitive     CEFEPIME <=0.12 SENSITIVE Sensitive     CEFTRIAXONE <=0.25 SENSITIVE Sensitive     CIPROFLOXACIN <=0.25 SENSITIVE Sensitive     GENTAMICIN <=1 SENSITIVE Sensitive     IMIPENEM <=0.25 SENSITIVE Sensitive     NITROFURANTOIN <=16 SENSITIVE Sensitive     TRIMETH/SULFA <=20 SENSITIVE Sensitive     AMPICILLIN/SULBACTAM <=2 SENSITIVE Sensitive     PIP/TAZO <=4 SENSITIVE Sensitive     * >=100,000 COLONIES/mL ESCHERICHIA COLI  Culture, blood (routine x 2)     Status: None (Preliminary result)   Collection Time: 09/09/20  1:22 AM   Specimen: BLOOD  Result Value Ref Range Status   Specimen Description BLOOD BLOOD LEFT HAND  Final   Special Requests   Final    BOTTLES DRAWN AEROBIC AND ANAEROBIC Blood Culture adequate volume   Culture   Final    NO GROWTH 3 DAYS Performed at [facility], [address] [place], [place]  [address]    Report Status PENDING  Incomplete  Culture, blood (routine x 2)     Status: None (Preliminary result)   Collection Time: 09/09/20  1:35 AM   Specimen: BLOOD  Result Value Ref Range Status   Specimen Description BLOOD BLOOD RIGHT HAND  Final   Special Requests   Final    BOTTLES DRAWN AEROBIC AND ANAEROBIC Blood Culture adequate volume   Culture   Final    NO GROWTH 3 DAYS Performed at [facility], [address] [place], [place] [address]    Report Status PENDING  Incomplete  MRSA PCR Screening     Status: Abnormal   Collection Time: 09/09/20  7:18 PM  Result Value Ref Range Status   MRSA by PCR POSITIVE (A) NEGATIVE Final    Comment:        The GeneXpert MRSA Assay (FDA approved for NASAL specimens only), is one component of a comprehensive MRSA colonization surveillance program. It is not intended to diagnose MRSA infection nor to guide or monitor treatment for MRSA infections. RESULT CALLED TO, READ BACK BY AND VERIFIED WITH: HILARY, RN @ 0040 ON 09/10/20 Riesa Pope VARNER Performed at [facility], [address] [place], [place] [address]      Labs: Basic Metabolic Panel: Recent Labs  Lab 09/08/20 2007 09/09/20 0612 09/10/20 0419 09/11/20 0757  NA 140 141 139 137  K 3.8 3.7 4.0 3.8  CL 106 111 108 103  CO2 27 26 25 26   GLUCOSE 111* 109* 81 94  BUN 29* 22 20 16   CREATININE 1.14* 0.90  0.94 0.96 0.85  CALCIUM 9.2 8.1* 8.0* 7.8*  MG  --   --  1.5* 1.9   Liver Function Tests: Recent Labs  Lab 09/08/20 2007  AST 21  ALT 18  ALKPHOS 56  BILITOT 0.4  PROT 7.1  ALBUMIN 3.5   Recent Labs  Lab 09/08/20 2007  LIPASE 33   No results for input(s): AMMONIA in the last 168 hours. CBC: Recent Labs  Lab 09/08/20 2007 09/09/20 0612 09/10/20 0419 09/11/20 0757  WBC 10.5 12.2*  12.4* 8.1 6.2  NEUTROABS  --   --  5.9 3.8  HGB 9.6* 8.3*  8.4* 8.8* 8.8*  HCT 28.2* 24.9*  25.0* 25.8* 26.0*  MCV 75.2* 77.6*  77.9* 75.4* 76.0*   PLT 253 204  208 219 226   Cardiac Enzymes: No results for input(s): CKTOTAL, CKMB, CKMBINDEX, TROPONINI in the last 168 hours. BNP: BNP (last 3 results) No results for input(s): BNP in the last 8760 hours.  ProBNP (last 3 results) No results for input(s): PROBNP in the last 8760 hours.  CBG: No results for input(s): GLUCAP in the last 168 hours.  Principal Problem:   Sepsis (HCC) Active Problems:   Dementia with behavioral disturbance (HCC)   Urinary tract infection with hematuria   Anemia   Nausea and vomiting   Hypotension   CKD (chronic kidney disease) stage 3, GFR 30-59 ml/min (HCC)   Time coordinating discharge: 38 minutes.  Signed:        Stefon Ramthun, DO [facility]  09/12/2020, 10:44 AM

## 2020-09-12 NOTE — [place] FL2 (Signed)
[place] MEDICAID FL2 LEVEL OF CARE SCREENING TOOL     IDENTIFICATION  Patient Name: Kimberly Gregory Birthdate: 11-05-1935 Sex: female Admission Date (Current Location): 09/08/2020  [facility] and [place] Number:  Producer, television/film/video and Address:  [facility],  [address] [place], [place] [address]      Provider Number: [id number]  Attending Physician Name and Address:  Fran Lowes, DO  Relative Name and Phone Number:       Current Level of Care: Hospital Recommended Level of Care: Assisted Living Facility Prior Approval Number:    Date Approved/Denied:   PASRR Number:    Discharge Plan: Other (Comment) (ALF-Memory Care)    Current Diagnoses: Patient Active Problem List   Diagnosis Date Noted   Sepsis (HCC) 09/09/2020   Urinary tract infection with hematuria 09/09/2020   Anemia 09/09/2020   Nausea and vomiting 09/09/2020   Hypotension 09/09/2020   CKD (chronic kidney disease) stage 3, GFR 30-59 ml/min (HCC) 09/09/2020   Visual hallucinations 08/18/2019   Gastroesophageal reflux disease without esophagitis 09/11/2018   Former smoker 09/11/2018   Vitamin D deficiency 09/11/2018   Depressive disorder in remission (HCC) 09/11/2018   Dementia with behavioral disturbance (HCC) 10/21/2016   Benign essential HTN 10/14/2014   Hyperlipidemia 10/14/2014    Orientation RESPIRATION BLADDER Height & Weight        Normal Continent Weight: 147 lb 11.3 oz (67 kg) Height:  5\' 1"  (154.9 cm)  BEHAVIORAL SYMPTOMS/MOOD NEUROLOGICAL BOWEL NUTRITION STATUS      Continent Diet (Soft)  AMBULATORY STATUS COMMUNICATION OF NEEDS Skin   Supervision Verbally Normal                       Personal Care Assistance Level of Assistance  Bathing, Feeding, Dressing Bathing Assistance: Limited assistance Feeding assistance: Independent Dressing Assistance: Limited assistance Total Care Assistance: Limited assistance   Functional Limitations Info  Sight, Hearing, Speech  Sight Info: Adequate Hearing Info: Adequate Speech Info: Adequate    SPECIAL CARE FACTORS FREQUENCY  PT (By licensed PT), OT (By licensed OT)     PT Frequency: 2x per week OT Frequency: 2x per week            Contractures Contractures Info: Not present    Additional Factors Info  Code Status, Allergies Code Status Info: Full Allergies Info: NKA           Current Medications (09/12/2020):  This is the current hospital active medication list Current Facility-Administered Medications  Medication Dose Route Frequency Provider Last Rate Last Admin   acetaminophen (TYLENOL) tablet 650 mg  650 mg Oral Q6H PRN Chotiner, 11/13/2020, MD   650 mg at 09/10/20 1837   Or   acetaminophen (TYLENOL) suppository 650 mg  650 mg Rectal Q6H PRN Chotiner, 11/11/20, MD   650 mg at 09/10/20 0734   ARIPiprazole (ABILIFY) tablet 2 mg  2 mg Oral Daily Pahwani, 11/11/20, MD   2 mg at 09/12/20 1245   ceFAZolin (ANCEF) IVPB 1 g/50 mL premix  1 g Intravenous Q8H Swayze, Ava, DO 100 mL/hr at 09/12/20 0555 1 g at 09/12/20 0555   Chlorhexidine Gluconate Cloth 2 % PADS 6 each  6 each Topical Q0600 11/13/20, MD   6 each at 09/12/20 0556   divalproex (DEPAKOTE SPRINKLE) capsule 250 mg  250 mg Oral TID 11/13/20, MD   250 mg at 09/12/20 1249   enoxaparin (LOVENOX) injection 40 mg  40 mg  Subcutaneous Q24H Chotiner, Claudean Severance, MD   40 mg at 09/11/20 1218   feeding supplement (BOOST / RESOURCE BREEZE) liquid 1 Container  1 Container Oral Q24H Pahwani, Daleen Bo, MD       feeding supplement (ENSURE ENLIVE / ENSURE PLUS) liquid 237 mL  237 mL Oral Q24H Hughie Closs, MD   237 mL at 09/11/20 2201   lactated ringers infusion   Intravenous Continuous Hughie Closs, MD 75 mL/hr at 09/11/20 2159 New Bag at 09/11/20 2159   melatonin tablet 3 mg  3 mg Oral QHS Hughie Closs, MD   3 mg at 09/11/20 2154   multivitamin with minerals tablet 1 tablet  1 tablet Oral Daily Hughie Closs, MD   1 tablet at 09/12/20 1244   mupirocin  ointment (BACTROBAN) 2 % 1 application  1 application Nasal BID Hughie Closs, MD   1 application at 09/11/20 2201   ondansetron (ZOFRAN) tablet 4 mg  4 mg Oral Q6H PRN Chotiner, Claudean Severance, MD       Or   ondansetron (ZOFRAN) injection 4 mg  4 mg Intravenous Q6H PRN Chotiner, Claudean Severance, MD       senna-docusate (Senokot-S) tablet 1 tablet  1 tablet Oral QHS PRN Chotiner, Claudean Severance, MD       sertraline (ZOLOFT) tablet 25 mg  25 mg Oral Daily Hughie Closs, MD   25 mg at 09/12/20 1248     Discharge Medications: Please see discharge summary for a list of discharge medications.  Relevant Imaging Results:  Relevant Lab Results:   Additional Information SSN #:  [id number]  Ht:  5\' 1"   Wt:  147 lbs  Eston Heslin C Tarpley-Carter, LCSWA

## 2020-09-14 LAB — CULTURE, BLOOD (ROUTINE X 2)
Culture: NO GROWTH
Culture: NO GROWTH
Special Requests: ADEQUATE
Special Requests: ADEQUATE

## 2020-09-15 DIAGNOSIS — N390 Urinary tract infection, site not specified: Secondary | ICD-10-CM | POA: Diagnosis not present

## 2020-09-15 DIAGNOSIS — R112 Nausea with vomiting, unspecified: Secondary | ICD-10-CM | POA: Diagnosis not present

## 2020-09-17 ENCOUNTER — Other Ambulatory Visit: Payer: Self-pay | Admitting: Adult Health

## 2020-09-17 ENCOUNTER — Telehealth: Payer: Self-pay

## 2020-09-17 DIAGNOSIS — I700 Atherosclerosis of aorta: Secondary | ICD-10-CM | POA: Insufficient documentation

## 2020-09-17 NOTE — Progress Notes (Deleted)
Hospital follow up  Assessment and Plan: Hospital visit follow up for ***:   All medications were reviewed with patient and family and fully reconciled. All questions answered fully, and patient and family members were encouraged to call the office with any further questions or concerns. Discussed goal to avoid readmission related to this diagnosis.  There are no discontinued medications.  CAN NOT DO FOR BCBS REGULAR OR MEDICARE Over 40 minutes of exam, counseling, chart review, and complex, high/moderate level critical decision making was performed this visit.   Future Appointments  Date Time Provider Department Center  09/20/2020  9:00 AM [name], NP [facility] None     HPI 85 y.o.female presents for follow up for transition from recent hospitalization or SNIF stay. Admit date to the hospital was 09/08/20, patient was discharged from the hospital on 09/12/20 and our clinical staff contacted the office the day after discharge to set up a follow up appointment. The discharge summary, medications, and diagnostic test results were reviewed before meeting with the patient. The patient was admitted for:   Discharge Diagnoses: Principal diagnosis is #1 Sepsis secondary to UTI Hypomagnesemia CKDIIIa Hypotension Advanced dementia Chronic anemia   Discharge Condition: Fair Disposition: Return to memory care facility   History of present illness (copied from discharge note):   Kimberly Gregory is a 85 y.o. female with medical history significant for Dementia, HTN, HLD, GERD, OA who present3ed to [facility] from assisted living facility with report of nausea, vomiting and shaking.  The staff had reported to the ER that patient has had these type of symptoms before with a UTI.  She had any dysuria or urinary frequency.  She did have multiple episodes of vomiting at the facility.  In the emergency room she was given Zofran and vomiting was controlled.  Initially she was given to be  discharged back to the assisted living facility when she developed a fever and her blood pressure dropped.  It was then felt that patient probably had sepsis and was kept in the ER for continued work-up.  Lactic acid was mildly elevated at 2.0.  Blood pressure improved with IV fluid hydration.  ER physician discussed with covering hospitalist, Dr. Margo Aye, who accept the patient in transfer to [facility] for admission for sepsis and UTI.    Hospital Course (copied from discharge note):  The patient was admitted. She was given IV ceftriaxone and magnesium supplementation. Her urine culture has grown out pan sensitive E.coli. She was changed to ancef on 09/11/2020. The patient will be discharged to home on keflex.   Hospital follow up 09/22/2020: *** There were no vitals taken for this visit.  Elder female residing in memory care facility with recent admission for urosepsis per above. She has not been seen in office since 11/19/2019 due to demential with behavioral disturbance that ultimately led to memory care admission with the assistance of palliative care. ***  Last labs from 09/16/2020 reviewed; renals improved with GFR 60+, Cr 0.85, GUN 16. Previously low mag improved to 1.9 prior to discharge.   She does have notable anemia, new in last year, had acute drop suggestive of blood loss but stable hgm in 8-9 range on last several checks, did have negative hemoccult 05/22/2020 per lab review in system. Ferritin was low at 13 at that time. *** iron?  Component     Latest Ref Rng & Units 08/13/2018 02/18/2019 11/19/2019 04/21/2020             Hemoglobin  12.0 - 15.0 g/dL 99.3 71.6 96.7 89.3 (L)  HCT     36.0 - 46.0 % 39.5 38.1 42.1 33.1 (L)  MCV     80.0 - 100.0 fL 86.1 87.2 85.7 80.3  MCH     26.0 - 34.0 pg 29.4 28.8 28.7 27.2  MCHC     30.0 - 36.0 g/dL 81.0 17.5 10.2 58.5  RDW     11.5 - 15.5 % 14.9 14.1 14.5 15.6 (H)   Component     Latest Ref Rng & Units 09/08/2020 09/09/2020 09/09/2020 09/10/2020           6:12 AM  6:12 AM   Hemoglobin     12.0 - 15.0 g/dL 9.6 (L) 8.4 (L) 8.3 (L) 8.8 (L)  HCT     36.0 - 46.0 % 28.2 (L) 25.0 (L) 24.9 (L) 25.8 (L)  MCV     80.0 - 100.0 fL 75.2 (L) 77.9 (L) 77.6 (L) 75.4 (L)  MCH     26.0 - 34.0 pg 25.6 (L) 26.2 25.9 (L) 25.7 (L)  MCHC     30.0 - 36.0 g/dL 27.7 82.4 23.5 36.1  RDW     11.5 - 15.5 % 18.3 (H) 18.7 (H) 18.7 (H) 18.8 (H)   Component     Latest Ref Rng & Units 09/11/2020          Hemoglobin     12.0 - 15.0 g/dL 8.8 (L)  HCT     44.3 - 46.0 % 26.0 (L)  MCV     80.0 - 100.0 fL 76.0 (L)  MCH     26.0 - 34.0 pg 25.7 (L)  MCHC     30.0 - 36.0 g/dL 15.4  RDW     00.8 - 67.6 % 18.9 (H)    No results found for: IRON, TIBC, FERRITIN     Her blood pressure {HAS HAS NOT:18834} been controlled at home, today their BP is    She {DOES_DOES PPJ:09326} workout. She denies chest pain, shortness of breath, dizziness.     Home health {ACTION; IS/IS [id number]} involved.   Images while in the hospital: CT Head Wo Contrast  Result Date: 09/08/2020 CLINICAL DATA:  Altered mental status. EXAM: CT HEAD WITHOUT CONTRAST TECHNIQUE: Contiguous axial images were obtained from the base of the skull through the vertex without intravenous contrast. COMPARISON:  October 21, 2016 FINDINGS: Brain: There is mild to moderate severity cerebral atrophy with widening of the extra-axial spaces and ventricular dilatation. There are areas of decreased attenuation within the white matter tracts of the supratentorial brain, consistent with microvascular disease changes. Vascular: No hyperdense vessel or unexpected calcification. Skull: Normal. Negative for fracture or focal lesion. Sinuses/Orbits: No acute finding. Other: None. IMPRESSION: 1. Generalized cerebral atrophy. 2. No acute intracranial abnormality. Electronically Signed   By: Aram Candela M.D.   On: 09/08/2020 20:49   DG Chest Port 1 View  Result Date: 09/08/2020 CLINICAL DATA:  Tremors with nausea and  vomiting. EXAM: PORTABLE CHEST 1 VIEW COMPARISON:  April 21, 2020 FINDINGS: Mild, diffuse, chronic appearing increased interstitial lung markings are seen. Mild, stable right apical atelectasis is noted. There is no evidence of a pleural effusion or pneumothorax. The heart size and mediastinal contours are within normal limits. There is mild calcification of the aortic arch. The visualized skeletal structures are unremarkable. IMPRESSION: No active cardiopulmonary disease. Electronically Signed   By: Aram Candela M.D.   On: 09/08/2020 20:54  Current Outpatient Medications (Other):    ARIPiprazole (ABILIFY) 2 MG tablet, Take 2 mg by mouth daily.   Cholecalciferol (VITAMIN D3) 1.25 MG (50000 UT) CAPS, Take 1 capsule 3  /week for Vitamin D Deficiency   divalproex (DEPAKOTE SPRINKLE) 125 MG capsule, Take 250 mg by mouth 3 (three) times daily.   feeding supplement (ENSURE ENLIVE / ENSURE PLUS) LIQD, Take 237 mLs by mouth daily.   melatonin 3 MG TABS tablet, Take 3 mg by mouth at bedtime.   Multiple Vitamin (MULTIVITAMIN WITH MINERALS) TABS tablet, Take 1 tablet by mouth daily.   ondansetron (ZOFRAN) 4 MG tablet, Take 1 tablet (4 mg total) by mouth every 8 (eight) hours as needed for nausea or vomiting.   sertraline (ZOLOFT) 25 MG tablet, Take 25 mg by mouth daily.  Past Medical History:  Diagnosis Date   Acute encephalopathy 06/13/2016   AKI (acute kidney injury) (HCC) 06/13/2016   Anemia 09/09/2020   Arthritis    Chronic kidney disease    Dementia (HCC)    Depression 09/11/2018   Gastroesophageal reflux disease without esophagitis 09/11/2018   Headache    Hypercholesterolemia    Hypertension    Ischemic colitis (HCC) 10/24/2016   Laxative abuse 10/21/2016   SIRS (systemic inflammatory response syndrome) (HCC) 10/21/2016   Vertigo 10/14/2014     No Known Allergies  ROS: all negative except above.   Physical Exam: There were no vitals filed for this visit. There were no  vitals taken for this visit. General Appearance: Well nourished, in no apparent distress. Eyes: PERRLA, EOMs, conjunctiva no swelling or erythema Sinuses: No Frontal/maxillary tenderness ENT/Mouth: Ext aud canals clear, TMs without erythema, bulging. No erythema, swelling, or exudate on post pharynx.  Tonsils not swollen or erythematous. Hearing normal.  Neck: Supple, thyroid normal.  Respiratory: Respiratory effort normal, BS equal bilaterally without rales, rhonchi, wheezing or stridor.  Cardio: RRR with no MRGs. Brisk peripheral pulses without edema.  Abdomen: Soft, + BS.  Non tender, no guarding, rebound, hernias, masses. Lymphatics: Non tender without lymphadenopathy.  Musculoskeletal: Full ROM, 5/5 strength, normal gait.  Skin: Warm, dry without rashes, lesions, ecchymosis.  Neuro: Cranial nerves intact. Normal muscle tone, no cerebellar symptoms. Sensation intact.  Psych: Awake and oriented X 3, normal affect, Insight and Judgment appropriate.     Dan Maker, NP 1:13 PM [facility]

## 2020-09-17 NOTE — Telephone Encounter (Signed)
Called patient on 09/17/2020 , 10:34 AM in an attempt to reach the patient for a hospital follow up.   Admit date: 09/08/20 Discharge: 09/12/20   Spoke with patient's son, Gaynelle Arabian, he does not have any questions or concerns about medications from the hospital admission. The patient's medications were reviewed over the phone, they were counseled to bring in all current medications to the hospital follow up visit.   I advised the patient to call if any questions or concerns arise about the hospital admission or medications    Home health was not started in the hospital.  All questions were answered and a follow up appointment was made.    Prior to Admission medications   Medication Sig Start Date End Date Taking? Authorizing Provider  ARIPiprazole (ABILIFY) 2 MG tablet Take 2 mg by mouth daily. 07/31/20   [provider]  Cholecalciferol (VITAMIN D3) 1.25 MG (50000 UT) CAPS Take 1 capsule 3  /week for Vitamin D Deficiency 01/25/19   Lucky Cowboy, MD  divalproex (DEPAKOTE SPRINKLE) 125 MG capsule Take 250 mg by mouth 3 (three) times daily. 07/31/20   [provider]  feeding supplement (ENSURE ENLIVE / ENSURE PLUS) LIQD Take 237 mLs by mouth daily. 09/12/20   Swayze, Ava, DO  melatonin 3 MG TABS tablet Take 3 mg by mouth at bedtime.    [provider]  Multiple Vitamin (MULTIVITAMIN WITH MINERALS) TABS tablet Take 1 tablet by mouth daily. 09/12/20   Swayze, Ava, DO  ondansetron (ZOFRAN) 4 MG tablet Take 1 tablet (4 mg total) by mouth every 8 (eight) hours as needed for nausea or vomiting. 09/12/20   Swayze, Ava, DO  sertraline (ZOLOFT) 25 MG tablet Take 25 mg by mouth daily. 07/31/20   [provider]   Patient resides currently at [facility]. Has been there for the last three months. Really enjoys being there.

## 2020-09-20 ENCOUNTER — Ambulatory Visit: Payer: Medicare Other | Admitting: Adult Health

## 2020-09-28 ENCOUNTER — Emergency Department (HOSPITAL_COMMUNITY): Payer: Medicare Other

## 2020-09-28 ENCOUNTER — Inpatient Hospital Stay (HOSPITAL_COMMUNITY)
Admission: EM | Admit: 2020-09-28 | Discharge: 2020-10-03 | DRG: 871 | Disposition: A | Discharge status: Skilled Nursing Facility | Payer: Medicare Other | Source: Skilled Nursing Facility | Attending: Internal Medicine | Admitting: Internal Medicine

## 2020-09-28 ENCOUNTER — Encounter (HOSPITAL_COMMUNITY): Payer: Self-pay

## 2020-09-28 DIAGNOSIS — Z9049 Acquired absence of other specified parts of digestive tract: Secondary | ICD-10-CM | POA: Diagnosis not present

## 2020-09-28 DIAGNOSIS — Z515 Encounter for palliative care: Secondary | ICD-10-CM

## 2020-09-28 DIAGNOSIS — R0689 Other abnormalities of breathing: Secondary | ICD-10-CM | POA: Diagnosis not present

## 2020-09-28 DIAGNOSIS — J9811 Atelectasis: Secondary | ICD-10-CM | POA: Diagnosis not present

## 2020-09-28 DIAGNOSIS — Z66 Do not resuscitate: Secondary | ICD-10-CM | POA: Diagnosis not present

## 2020-09-28 DIAGNOSIS — D638 Anemia in other chronic diseases classified elsewhere: Secondary | ICD-10-CM | POA: Diagnosis not present

## 2020-09-28 DIAGNOSIS — F32A Depression, unspecified: Secondary | ICD-10-CM | POA: Diagnosis not present

## 2020-09-28 DIAGNOSIS — F0390 Unspecified dementia without behavioral disturbance: Secondary | ICD-10-CM | POA: Diagnosis present

## 2020-09-28 DIAGNOSIS — Z79899 Other long term (current) drug therapy: Secondary | ICD-10-CM

## 2020-09-28 DIAGNOSIS — R410 Disorientation, unspecified: Secondary | ICD-10-CM

## 2020-09-28 DIAGNOSIS — R531 Weakness: Secondary | ICD-10-CM | POA: Diagnosis not present

## 2020-09-28 DIAGNOSIS — R404 Transient alteration of awareness: Secondary | ICD-10-CM | POA: Diagnosis not present

## 2020-09-28 DIAGNOSIS — R6889 Other general symptoms and signs: Secondary | ICD-10-CM | POA: Diagnosis not present

## 2020-09-28 DIAGNOSIS — N183 Chronic kidney disease, stage 3 unspecified: Secondary | ICD-10-CM | POA: Diagnosis present

## 2020-09-28 DIAGNOSIS — K219 Gastro-esophageal reflux disease without esophagitis: Secondary | ICD-10-CM | POA: Diagnosis not present

## 2020-09-28 DIAGNOSIS — F0391 Unspecified dementia with behavioral disturbance: Secondary | ICD-10-CM | POA: Diagnosis present

## 2020-09-28 DIAGNOSIS — Z743 Need for continuous supervision: Secondary | ICD-10-CM | POA: Diagnosis not present

## 2020-09-28 DIAGNOSIS — E7800 Pure hypercholesterolemia, unspecified: Secondary | ICD-10-CM | POA: Diagnosis present

## 2020-09-28 DIAGNOSIS — I129 Hypertensive chronic kidney disease with stage 1 through stage 4 chronic kidney disease, or unspecified chronic kidney disease: Secondary | ICD-10-CM | POA: Diagnosis present

## 2020-09-28 DIAGNOSIS — F03918 Unspecified dementia, unspecified severity, with other behavioral disturbance: Secondary | ICD-10-CM | POA: Diagnosis present

## 2020-09-28 DIAGNOSIS — N3001 Acute cystitis with hematuria: Principal | ICD-10-CM | POA: Diagnosis present

## 2020-09-28 DIAGNOSIS — Z87440 Personal history of urinary (tract) infections: Secondary | ICD-10-CM

## 2020-09-28 DIAGNOSIS — G9341 Metabolic encephalopathy: Secondary | ICD-10-CM | POA: Diagnosis not present

## 2020-09-28 DIAGNOSIS — Z8719 Personal history of other diseases of the digestive system: Secondary | ICD-10-CM | POA: Diagnosis not present

## 2020-09-28 DIAGNOSIS — R6520 Severe sepsis without septic shock: Secondary | ICD-10-CM | POA: Diagnosis not present

## 2020-09-28 DIAGNOSIS — I959 Hypotension, unspecified: Secondary | ICD-10-CM | POA: Diagnosis present

## 2020-09-28 DIAGNOSIS — E876 Hypokalemia: Secondary | ICD-10-CM | POA: Diagnosis not present

## 2020-09-28 DIAGNOSIS — N390 Urinary tract infection, site not specified: Secondary | ICD-10-CM | POA: Diagnosis not present

## 2020-09-28 DIAGNOSIS — Z87891 Personal history of nicotine dependence: Secondary | ICD-10-CM

## 2020-09-28 DIAGNOSIS — Z20822 Contact with and (suspected) exposure to covid-19: Secondary | ICD-10-CM | POA: Diagnosis present

## 2020-09-28 DIAGNOSIS — A419 Sepsis, unspecified organism: Secondary | ICD-10-CM | POA: Diagnosis not present

## 2020-09-28 DIAGNOSIS — I499 Cardiac arrhythmia, unspecified: Secondary | ICD-10-CM | POA: Diagnosis not present

## 2020-09-28 DIAGNOSIS — Z7189 Other specified counseling: Secondary | ICD-10-CM | POA: Diagnosis not present

## 2020-09-28 DIAGNOSIS — I10 Essential (primary) hypertension: Secondary | ICD-10-CM | POA: Diagnosis present

## 2020-09-28 DIAGNOSIS — A4151 Sepsis due to Escherichia coli [E. coli]: Principal | ICD-10-CM | POA: Diagnosis present

## 2020-09-28 DIAGNOSIS — R000 Tachycardia, unspecified: Secondary | ICD-10-CM | POA: Diagnosis not present

## 2020-09-28 LAB — CBC WITH DIFFERENTIAL/PLATELET
Abs Immature Granulocytes: 0.08 10*3/uL — ABNORMAL HIGH (ref 0.00–0.07)
Basophils Absolute: 0 10*3/uL (ref 0.0–0.1)
Basophils Relative: 0 %
Eosinophils Absolute: 0 10*3/uL (ref 0.0–0.5)
Eosinophils Relative: 0 %
HCT: 25.7 % — ABNORMAL LOW (ref 36.0–46.0)
Hemoglobin: 8.6 g/dL — ABNORMAL LOW (ref 12.0–15.0)
Immature Granulocytes: 1 %
Lymphocytes Relative: 7 %
Lymphs Abs: 0.9 10*3/uL (ref 0.7–4.0)
MCH: 25.9 pg — ABNORMAL LOW (ref 26.0–34.0)
MCHC: 33.5 g/dL (ref 30.0–36.0)
MCV: 77.4 fL — ABNORMAL LOW (ref 80.0–100.0)
Monocytes Absolute: 1.1 10*3/uL — ABNORMAL HIGH (ref 0.1–1.0)
Monocytes Relative: 9 %
Neutro Abs: 10.2 10*3/uL — ABNORMAL HIGH (ref 1.7–7.7)
Neutrophils Relative %: 83 %
Platelets: 226 10*3/uL (ref 150–400)
RBC: 3.32 MIL/uL — ABNORMAL LOW (ref 3.87–5.11)
RDW: 19.7 % — ABNORMAL HIGH (ref 11.5–15.5)
WBC: 12.3 10*3/uL — ABNORMAL HIGH (ref 4.0–10.5)
nRBC: 0 % (ref 0.0–0.2)

## 2020-09-28 LAB — URINALYSIS, ROUTINE W REFLEX MICROSCOPIC
Bilirubin Urine: NEGATIVE
Glucose, UA: NEGATIVE mg/dL
Ketones, ur: NEGATIVE mg/dL
Nitrite: POSITIVE — AB
Protein, ur: 100 mg/dL — AB
RBC / HPF: >50 RBC/hpf — ABNORMAL HIGH (ref 0–5)
Specific Gravity, Urine: 1.016 (ref 1.005–1.030)
WBC, UA: >50 WBC/hpf — ABNORMAL HIGH (ref 0–5)
pH: 6 (ref 5.0–8.0)

## 2020-09-28 LAB — PROTIME-INR
INR: 1.2 (ref 0.8–1.2)
Prothrombin Time: 14.8 seconds (ref 11.4–15.2)

## 2020-09-28 LAB — COMPREHENSIVE METABOLIC PANEL
ALT: 18 U/L (ref 0–44)
AST: 28 U/L (ref 15–41)
Albumin: 2.9 g/dL — ABNORMAL LOW (ref 3.5–5.0)
Alkaline Phosphatase: 52 U/L (ref 38–126)
Anion gap: 7 (ref 5–15)
BUN: 23 mg/dL (ref 8–23)
CO2: 24 mmol/L (ref 22–32)
Calcium: 8.3 mg/dL — ABNORMAL LOW (ref 8.9–10.3)
Chloride: 110 mmol/L (ref 98–111)
Creatinine, Ser: 1.04 mg/dL — ABNORMAL HIGH (ref 0.44–1.00)
GFR, Estimated: 53 mL/min — ABNORMAL LOW (ref >60)
Glucose, Bld: 108 mg/dL — ABNORMAL HIGH (ref 70–99)
Potassium: 3.7 mmol/L (ref 3.5–5.1)
Sodium: 141 mmol/L (ref 135–145)
Total Bilirubin: 0.8 mg/dL (ref 0.3–1.2)
Total Protein: 6.8 g/dL (ref 6.5–8.1)

## 2020-09-28 LAB — RESP PANEL BY RT-PCR (FLU A&B, COVID) ARPGX2
Influenza A by PCR: NEGATIVE
Influenza B by PCR: NEGATIVE
SARS Coronavirus 2 by RT PCR: NEGATIVE

## 2020-09-28 LAB — LACTIC ACID, PLASMA: Lactic Acid, Venous: 1.6 mmol/L (ref 0.5–1.9)

## 2020-09-28 LAB — APTT: aPTT: 34 seconds (ref 24–36)

## 2020-09-28 MED ORDER — SODIUM CHLORIDE 0.9 % IV SOLN
2.0000 g | Freq: Once | INTRAVENOUS | Status: AC
  Administered 2020-09-28: 2 g via INTRAVENOUS
  Filled 2020-09-28: qty 2

## 2020-09-28 MED ORDER — SODIUM CHLORIDE 0.9 % IV SOLN
2.0000 g | Freq: Two times a day (BID) | INTRAVENOUS | Status: DC
  Administered 2020-09-29 – 2020-09-30 (×3): 2 g via INTRAVENOUS
  Filled 2020-09-28 (×3): qty 2

## 2020-09-28 MED ORDER — ACETAMINOPHEN 325 MG PO TABS
650.0000 mg | ORAL_TABLET | Freq: Once | ORAL | Status: DC
  Filled 2020-09-28: qty 2

## 2020-09-28 MED ORDER — ACETAMINOPHEN 650 MG RE SUPP
650.0000 mg | Freq: Once | RECTAL | Status: AC
  Administered 2020-09-28: 650 mg via RECTAL
  Filled 2020-09-28: qty 1

## 2020-09-28 NOTE — ED Notes (Signed)
Pt resting in bed quietly. Pt requesting another blanket. Blanket provided to pt. Urine sample sent to lab

## 2020-09-28 NOTE — ED Triage Notes (Signed)
Pt BIB EMS. Pt coming from [facility]; per staff pt started having AMS two hours ago. Hx dementia. Per EMS pt does meet sepsis criteria. Per staff pt was acting like this a few weeks ago and was admitted for sepsis.   BP 131/75 HR 104 RR 28 O2 96%

## 2020-09-28 NOTE — Progress Notes (Signed)
Pharmacy Antibiotic Note  Kimberly Gregory is a 85 y.o. female admitted on 09/28/2020 with sepsis in the setting of UTI.  Recent hospitalization for urosepsis.  Pharmacy has been consulted for Cefepime dosing.  Plan: Cefepime 2gm IV q12h Follow renal function and adjust dose as necessary F/U culture results & sensitivities  Height: 5\' 1"  (154.9 cm) Weight: 67 kg (147 lb 11.3 oz) IBW/kg (Calculated) : 47.8  Temp (24hrs), Avg:100.4 F (38 C), Min:98.7 F (37.1 C), Max:102.1 F (38.9 C)  Recent Labs  Lab 09/28/20 1730  WBC 12.3*  CREATININE 1.04*  LATICACIDVEN 1.6    Estimated Creatinine Clearance: 35.9 mL/min (A) (by C-G formula based on SCr of 1.04 mg/dL (H)).    No Known Allergies  Antimicrobials this admission: 7/19 Cefepime >>     Dose adjustments this admission:    Microbiology results: 7/19 BCx:   7/19 UCx:     Thank you for allowing pharmacy to be a part of this patient's care.  8/19, PharmD 09/28/2020 10:41 PM

## 2020-09-28 NOTE — ED Notes (Signed)
I went to notify the Doctor and Nurse that the Patient was shaking and was not saying anything.  The Notify the Doctor and was in the room along with me as we were given care to the Patient was did respone at the time that she was cold and shaking.  I noticed that the patinet remove all of her leads and need to be change. As of the right now the Patient is clean and she is back on the [place] plus is resting

## 2020-09-28 NOTE — ED Notes (Signed)
Pt had large BM. Pt cleaned up and new depend applied.

## 2020-09-28 NOTE — H&P (Signed)
History and Physical   Kimberly Gregory:[id number] DOB: 16-Feb-1936 DOA: 09/28/2020  Referring MD/NP/PA: Dr Delford Field  PCP: Lucky Cowboy, MD   Outpatient Specialists: None   Patient coming from: SNF  Chief Complaint: AMS  HPI: Kimberly Gregory is a 85 y.o. female with medical history significant of Dementia, essential hypertension, recent AKI, chronic kidney disease stage III, GERD, ischemic colitis, history of vertigo, depression, hyperlipidemia and anemia of chronic disease who was brought in from skilled facility with altered mental status.  Patient has recent E. coli UTI and was transition to Keflex.  She is unable to give any history.  Patient came to the ER where she was evaluated.  She did meet sepsis criteria.  Most likely secondary to UTI.  Also has met severe sepsis with hypotension.  She has responded to fluids initially.  Patient being admitted the hospital with severe sepsis due to UTI.  ED Course: Temperature 102.1 blood pressure 160/64 pulse 122 respiratory 20 oxygen sat 90% room air.  White count 12.3 hemoglobin 8.6 and platelets 326.  Creatinine 1.04 and calcium 8.3.  COVID-19 is negative.  Urinalysis showed cloudy urine was large leukocytes nitrite positive WBC more than 50 and many bacteria.  Chest x-ray showed no acute findings.  EKG showed no significant findings.  Blood and urine cultures obtained and patient being admitted for treatment of severe sepsis due to UTI  Review of Systems: As per HPI otherwise 10 point review of systems negative.    Past Medical History:  Diagnosis Date   Acute encephalopathy 06/13/2016   AKI (acute kidney injury) (HCC) 06/13/2016   Anemia 09/09/2020   Arthritis    Chronic kidney disease    Dementia (HCC)    Depression 09/11/2018   Gastroesophageal reflux disease without esophagitis 09/11/2018   Headache    Hypercholesterolemia    Hypertension    Ischemic colitis (HCC) 10/24/2016   Laxative abuse 10/21/2016   SIRS (systemic inflammatory  response syndrome) (HCC) 10/21/2016   Vertigo 10/14/2014    Past Surgical History:  Procedure Laterality Date   ABDOMINAL HYSTERECTOMY     BLADDER SURGERY     CATARACT EXTRACTION     CHOLECYSTECTOMY     COLONOSCOPY  2005   EYE SURGERY     KNEE SURGERY Right    arthroscopic     reports that she quit smoking about 14 months ago. Her smoking use included cigarettes. She has a 27.50 pack-year smoking history. She has never used smokeless tobacco. She reports that she does not drink alcohol and does not use drugs.  No Known Allergies  History reviewed. No pertinent family history.   Prior to Admission medications   Medication Sig Start Date End Date Taking? Authorizing Provider  ARIPiprazole (ABILIFY) 2 MG tablet Take 2 mg by mouth daily. 07/31/20  Yes [provider]  divalproex (DEPAKOTE SPRINKLE) 125 MG capsule Take 250 mg by mouth with breakfast, with lunch, and with evening meal. 07/31/20  Yes [provider]  feeding supplement (ENSURE ENLIVE / ENSURE PLUS) LIQD Take 237 mLs by mouth daily. 09/12/20  Yes Swayze, Ava, DO  melatonin 3 MG TABS tablet Take 3 mg by mouth at bedtime.   Yes [provider]  Multiple Vitamin (MULTIVITAMIN WITH MINERALS) TABS tablet Take 1 tablet by mouth daily. 09/12/20  Yes Swayze, Ava, DO  sertraline (ZOLOFT) 25 MG tablet Take 25 mg by mouth daily. 07/31/20  Yes [provider]  Cholecalciferol (VITAMIN D3) 1.25 MG (50000 UT)  CAPS Take 1 capsule 3  /week for Vitamin D Deficiency Patient not taking: Reported on 09/28/2020 01/25/19   Unk Pinto, MD  ondansetron (ZOFRAN) 4 MG tablet Take 1 tablet (4 mg total) by mouth every 8 (eight) hours as needed for nausea or vomiting. Patient not taking: Reported on 09/28/2020 09/12/20   Karie Kirks, DO    Physical Exam: Vitals:   09/28/20 2115 09/28/20 2118 09/28/20 2130 09/28/20 2145  BP: (!) 110/56  (!) 104/51 (!) 103/50  Pulse: 85  89 85  Resp: (!) 22  19 (!) 21  Temp:  98.7  F (37.1 C)    TempSrc:  Oral    SpO2: 98%  100% 95%  Weight:      Height:          Constitutional: Confused, altered mental status with no distress Vitals:   09/28/20 2115 09/28/20 2118 09/28/20 2130 09/28/20 2145  BP: (!) 110/56  (!) 104/51 (!) 103/50  Pulse: 85  89 85  Resp: (!) 22  19 (!) 21  Temp:  98.7 F (37.1 C)    TempSrc:  Oral    SpO2: 98%  100% 95%  Weight:      Height:       Eyes: PERRL, lids and conjunctivae normal ENMT: Mucous membranes are dry. Posterior pharynx clear of any exudate or lesions.Normal dentition.  Neck: normal, supple, no masses, no thyromegaly Respiratory: clear to auscultation bilaterally, no wheezing, no crackles. Normal respiratory effort. No accessory muscle use.  Cardiovascular: Sinus tachycardia, no murmurs / rubs / gallops. No extremity edema. 2+ pedal pulses. No carotid bruits.  Abdomen: no tenderness, no masses palpated. No hepatosplenomegaly. Bowel sounds positive.  Musculoskeletal: no clubbing / cyanosis. No joint deformity upper and lower extremities. Good ROM, no contractures. Normal muscle tone.  Skin: no rashes, lesions, ulcers. No induration Neurologic: CN 2-12 grossly intact. Sensation intact, DTR normal. Strength 5/5 in all 4.  Psychiatric: Confused and mildly attended.     Labs on Admission: I have personally reviewed following labs and imaging studies  CBC: Recent Labs  Lab 09/28/20 1730  WBC 12.3*  NEUTROABS 10.2*  HGB 8.6*  HCT 25.7*  MCV 77.4*  PLT 254   Basic Metabolic Panel: Recent Labs  Lab 09/28/20 1730  NA 141  K 3.7  CL 110  CO2 24  GLUCOSE 108*  BUN 23  CREATININE 1.04*  CALCIUM 8.3*   GFR: Estimated Creatinine Clearance: 35.9 mL/min (A) (by C-G formula based on SCr of 1.04 mg/dL (H)). Liver Function Tests: Recent Labs  Lab 09/28/20 1730  AST 28  ALT 18  ALKPHOS 52  BILITOT 0.8  PROT 6.8  ALBUMIN 2.9*   No results for input(s): LIPASE, AMYLASE in the last 168 hours. No results for  input(s): AMMONIA in the last 168 hours. Coagulation Profile: Recent Labs  Lab 09/28/20 1730  INR 1.2   Cardiac Enzymes: No results for input(s): CKTOTAL, CKMB, CKMBINDEX, TROPONINI in the last 168 hours. BNP (last 3 results) No results for input(s): PROBNP in the last 8760 hours. HbA1C: No results for input(s): HGBA1C in the last 72 hours. CBG: No results for input(s): GLUCAP in the last 168 hours. Lipid Profile: No results for input(s): CHOL, HDL, LDLCALC, TRIG, CHOLHDL, LDLDIRECT in the last 72 hours. Thyroid Function Tests: No results for input(s): TSH, T4TOTAL, FREET4, T3FREE, THYROIDAB in the last 72 hours. Anemia Panel: No results for input(s): VITAMINB12, FOLATE, FERRITIN, TIBC, IRON, RETICCTPCT in the last 72 hours. Urine  analysis:    Component Value Date/Time   COLORURINE AMBER (A) 09/28/2020 2100   APPEARANCEUR CLOUDY (A) 09/28/2020 2100   LABSPEC 1.016 09/28/2020 2100   PHURINE 6.0 09/28/2020 2100   GLUCOSEU NEGATIVE 09/28/2020 2100   HGBUR MODERATE (A) 09/28/2020 2100   BILIRUBINUR NEGATIVE 09/28/2020 2100   KETONESUR NEGATIVE 09/28/2020 2100   PROTEINUR 100 (A) 09/28/2020 2100   UROBILINOGEN 1.0 10/14/2014 0745   NITRITE POSITIVE (A) 09/28/2020 2100   LEUKOCYTESUR LARGE (A) 09/28/2020 2100   Sepsis Labs: $[id number]$ (procalcitonin:4,lacticidven:4) ) Recent Results (from the past 240 hour(s))  Resp Panel by RT-PCR (Flu A&B, Covid) Nasopharyngeal Swab     Status: None   Collection Time: 09/28/20  5:38 PM   Specimen: Nasopharyngeal Swab; Nasopharyngeal(NP) swabs in vial transport medium  Result Value Ref Range Status   SARS Coronavirus 2 by RT PCR NEGATIVE NEGATIVE Final    Comment: (NOTE) SARS-CoV-2 target nucleic acids are NOT DETECTED.  The SARS-CoV-2 RNA is generally detectable in upper respiratory specimens during the acute phase of infection. The lowest concentration of SARS-CoV-2 viral copies this assay can detect is 138 copies/mL. A negative result  does not preclude SARS-Cov-2 infection and should not be used as the sole basis for treatment or other patient management decisions. A negative result may occur with  improper specimen collection/handling, submission of specimen other than nasopharyngeal swab, presence of viral mutation(s) within the areas targeted by this assay, and inadequate number of viral copies(<138 copies/mL). A negative result must be combined with clinical observations, patient history, and epidemiological information. The expected result is Negative.  Fact Sheet for Patients:  EntrepreneurPulse.com.au  Fact Sheet for Healthcare Providers:  IncredibleEmployment.be  This test is no t yet approved or cleared by the [place] FDA and  has been authorized for detection and/or diagnosis of SARS-CoV-2 by FDA under an Emergency Use Authorization (EUA). This EUA will remain  in effect (meaning this test can be used) for the duration of the COVID-19 declaration under Section 564(b)(1) of the Act, 21 U.S.C.section 360bbb-3(b)(1), unless the authorization is terminated  or revoked sooner.       Influenza A by PCR NEGATIVE NEGATIVE Final   Influenza B by PCR NEGATIVE NEGATIVE Final    Comment: (NOTE) The Xpert Xpress SARS-CoV-2/FLU/RSV plus assay is intended as an aid in the diagnosis of influenza from Nasopharyngeal swab specimens and should not be used as a sole basis for treatment. Nasal washings and aspirates are unacceptable for Xpert Xpress SARS-CoV-2/FLU/RSV testing.  Fact Sheet for Patients: EntrepreneurPulse.com.au  Fact Sheet for Healthcare Providers: IncredibleEmployment.be  This test is not yet approved or cleared by the [place] FDA and has been authorized for detection and/or diagnosis of SARS-CoV-2 by FDA under an Emergency Use Authorization (EUA). This EUA will remain in effect (meaning this test can be used) for  the duration of the COVID-19 declaration under Section 564(b)(1) of the Act, 21 U.S.C. section 360bbb-3(b)(1), unless the authorization is terminated or revoked.  Performed at [facility], [place] [address] [place], [place] [address]      Radiological Exams on Admission: DG Chest Port 1 View  Result Date: 09/28/2020 CLINICAL DATA:  Questionable sepsis - evaluate for abnormality Combative patient with altered mental status. EXAM: PORTABLE CHEST 1 VIEW COMPARISON:  09/08/2020 FINDINGS: Patient is rotated. Stable heart size and mediastinal contours allowing for rotation. There is mild bibasilar atelectasis. No confluent consolidation. No pulmonary edema, large pleural effusion or pneumothorax. No acute osseous abnormalities are seen.  IMPRESSION: Mild bibasilar atelectasis. Electronically Signed   By: Keith Rake M.D.   On: 09/28/2020 21:48    EKG: Independently reviewed.  Sinus tachycardia otherwise no significant findings  Assessment/Plan Principal Problem:   Sepsis secondary to UTI [facility]) Active Problems:   Benign essential HTN   Dementia with behavioral disturbance (HCC)   Gastroesophageal reflux disease without esophagitis   Urinary tract infection with hematuria   Hypotension     #1 severe sepsis due to UTI: Patient initially hypotensive.  Blood pressure is now stabilized.  Admit the patient and initiate sepsis protocol.  Coming from a skilled facility we will treat her for healthcare associated UTI.  She has just been treated with UTI.  We will start cefepime.  Follow urine and blood culture results.  Hydrate aggressively.  #2 metabolic encephalopathy: Altered mental status most likely due to UTI.  Per nursing home not at baseline.  Continue treatment  #3 transient hypotension: Resolved.  Initial systolic blood pressure was 70s.  #4 recurrent UTIs: As per #1.  #5 dementia with behavioral disturbance: Continue close monitoring.  #6 GERD: Continue with  PPIs  #7 benign essential hypertension: Continue blood pressure control   DVT prophylaxis: Lovenox Code Status: Full code Family Communication: No family at bedside Disposition Plan: Back to skilled facility Consults called: None Admission status: Inpatient  Severity of Illness: The appropriate patient status for this patient is INPATIENT. Inpatient status is judged to be reasonable and necessary in order to provide the required intensity of service to ensure the patient's safety. The patient's presenting symptoms, physical exam findings, and initial radiographic and laboratory data in the context of their chronic comorbidities is felt to place them at high risk for further clinical deterioration. Furthermore, it is not anticipated that the patient will be medically stable for discharge from the hospital within 2 midnights of admission. The following factors support the patient status of inpatient.   " The patient's presenting symptoms include altered mental status. " The worrisome physical exam findings include confusion. " The initial radiographic and laboratory data are worrisome because of evidence of sepsis with UTI. " The chronic co-morbidities include dementia.   * I certify that at the point of admission it is my clinical judgment that the patient will require inpatient hospital care spanning beyond 2 midnights from the point of admission due to high intensity of service, high risk for further deterioration and high frequency of surveillance required.Barbette Merino MD [facility] Pager [phone]  If 7PM-7AM, please contact night-coverage [email or web address] Password [facility]  09/28/2020, 10:24 PM

## 2020-09-28 NOTE — ED Provider Notes (Signed)
[facility]-EMERGENCY DEPT Provider Note   CSN: [id number] Arrival date & time: 09/28/20  1622     History Chief Complaint  Patient presents with   Altered Mental Status    Kimberly Gregory is a 85 y.o. female.  Kimberly Gregory was discharged on September 13, 2020 after being hospitalized with urosepsis.  Culture grew E. coli sensitive to Keflex.  The history is provided by the EMS personnel and the nursing home. The history is limited by the condition of the patient (history of dementia).  Altered Mental Status Presenting symptoms: confusion and lethargy   Severity:  Moderate Most recent episode:  Today Episode history:  Single Duration:  1 day Timing:  Constant Progression:  Unchanged Chronicity:  New Context: nursing home resident   Associated symptoms: fever   Associated symptoms: no difficulty breathing and no vomiting       Past Medical History:  Diagnosis Date   Acute encephalopathy 06/13/2016   AKI (acute kidney injury) (HCC) 06/13/2016   Anemia 09/09/2020   Arthritis    Chronic kidney disease    Dementia (HCC)    Depression 09/11/2018   Gastroesophageal reflux disease without esophagitis 09/11/2018   Headache    Hypercholesterolemia    Hypertension    Ischemic colitis (HCC) 10/24/2016   Laxative abuse 10/21/2016   SIRS (systemic inflammatory response syndrome) (HCC) 10/21/2016   Vertigo 10/14/2014    Patient Active Problem List   Diagnosis Date Noted   Aortic arch atherosclerosis (HCC) CXR 09/2020 09/17/2020   Sepsis (HCC) 09/09/2020   Urinary tract infection with hematuria 09/09/2020   Anemia 09/09/2020   Nausea and vomiting 09/09/2020   Hypotension 09/09/2020   CKD (chronic kidney disease) stage 3, GFR 30-59 ml/min (HCC) 09/09/2020   Visual hallucinations 08/18/2019   Gastroesophageal reflux disease without esophagitis 09/11/2018   Former smoker 09/11/2018   Vitamin D deficiency 09/11/2018   Depressive disorder in remission (HCC) 09/11/2018    Dementia with behavioral disturbance (HCC) 10/21/2016   Benign essential HTN 10/14/2014   Hyperlipidemia 10/14/2014    Past Surgical History:  Procedure Laterality Date   ABDOMINAL HYSTERECTOMY     BLADDER SURGERY     CATARACT EXTRACTION     CHOLECYSTECTOMY     COLONOSCOPY  2005   EYE SURGERY     KNEE SURGERY Right    arthroscopic     OB History   No obstetric history on file.     History reviewed. No pertinent family history.  Social History   Tobacco Use   Smoking status: Former    Packs/day: 0.50    Years: 55.00    Pack years: 27.50    Types: Cigarettes    Quit date: 07/12/2019    Years since quitting: 1.2   Smokeless tobacco: Never   Tobacco comments:    Stopped smoking May 2021 per family  Vaping Use   Vaping Use: Never used  Substance Use Topics   Alcohol use: No   Drug use: No    Home Medications Prior to Admission medications   Medication Sig Start Date End Date Taking? Authorizing Provider  ARIPiprazole (ABILIFY) 2 MG tablet Take 2 mg by mouth daily. 07/31/20   [provider]  Cholecalciferol (VITAMIN D3) 1.25 MG (50000 UT) CAPS Take 1 capsule 3  /week for Vitamin D Deficiency 01/25/19   Lucky Cowboy, MD  divalproex (DEPAKOTE SPRINKLE) 125 MG capsule Take 250 mg by mouth 3 (three) times daily. 07/31/20   [provider]  feeding supplement (ENSURE ENLIVE / ENSURE PLUS) LIQD Take 237 mLs by mouth daily. 09/12/20   Swayze, Ava, DO  melatonin 3 MG TABS tablet Take 3 mg by mouth at bedtime.    [provider]  Multiple Vitamin (MULTIVITAMIN WITH MINERALS) TABS tablet Take 1 tablet by mouth daily. 09/12/20   Swayze, Ava, DO  ondansetron (ZOFRAN) 4 MG tablet Take 1 tablet (4 mg total) by mouth every 8 (eight) hours as needed for nausea or vomiting. 09/12/20   Swayze, Ava, DO  sertraline (ZOLOFT) 25 MG tablet Take 25 mg by mouth daily. 07/31/20   [provider]    Allergies    Patient has no known allergies.  Review of  Systems   Review of Systems  Unable to perform ROS: Dementia  Constitutional:  Positive for fever.  Gastrointestinal:  Negative for vomiting.  Psychiatric/Behavioral:  Positive for confusion.    Physical Exam Updated Vital Signs BP (!) 103/50   Pulse 85   Temp 98.7 F (37.1 C) (Oral)   Resp (!) 21   Ht 5\' 1"  (1.549 m)   Wt 67 kg   SpO2 95%   BMI 27.91 kg/m   Physical Exam Vitals and nursing note reviewed.  Constitutional:      Appearance: She is well-developed.  HENT:     Head: Normocephalic and atraumatic.  Pulmonary:     Effort: Tachypnea present.  Abdominal:     Palpations: Abdomen is soft.     Tenderness: There is no abdominal tenderness.  Musculoskeletal:     Thoracic back: No deformity or signs of trauma.     Lumbar back: No deformity or signs of trauma.     Right lower leg: No edema.     Left lower leg: No edema.  Skin:    General: Skin is warm and dry.  Neurological:     General: No focal deficit present.     Mental Status: She is alert.     Comments: Oriented to person only  Psychiatric:        Mood and Affect: Mood normal.        Behavior: Behavior normal.    ED Results / Procedures / Treatments   Labs (all labs ordered are listed, but only abnormal results are displayed) Labs Reviewed  CULTURE, BLOOD (SINGLE)  URINE CULTURE  RESP PANEL BY RT-PCR (FLU A&B, COVID) ARPGX2  LACTIC ACID, PLASMA  LACTIC ACID, PLASMA  COMPREHENSIVE METABOLIC PANEL  CBC WITH DIFFERENTIAL/PLATELET  PROTIME-INR  APTT  URINALYSIS, ROUTINE W REFLEX MICROSCOPIC    EKG EKG Interpretation  Date/Time:  Tuesday September 28 2020 16:59:07 EDT Ventricular Rate:  88 PR Interval:  145 QRS Duration: 93 QT Interval:  364 QTC Calculation: 441 R Axis:   -47 Text Interpretation: Sinus tachycardia Multiple premature complexes, vent & supraven LAD, consider left anterior fascicular block Low voltage, precordial leads no acute ischemia Confirmed by [id number] ([id number]) on 09/28/2020  5:58:58 PM  Radiology DG Chest Port 1 View  Result Date: 09/28/2020 CLINICAL DATA:  Questionable sepsis - evaluate for abnormality Combative patient with altered mental status. EXAM: PORTABLE CHEST 1 VIEW COMPARISON:  09/08/2020 FINDINGS: Patient is rotated. Stable heart size and mediastinal contours allowing for rotation. There is mild bibasilar atelectasis. No confluent consolidation. No pulmonary edema, large pleural effusion or pneumothorax. No acute osseous abnormalities are seen. IMPRESSION: Mild bibasilar atelectasis. Electronically Signed   By: 09/10/2020 M.D.   On: 09/28/2020 21:48    Procedures  Procedures   Medications Ordered in ED Medications  acetaminophen (TYLENOL) tablet 650 mg (650 mg Oral Patient Refused/Not Given 09/28/20 1735)  ceFEPIme (MAXIPIME) 2 g in sodium chloride 0.9 % 100 mL IVPB (has no administration in time range)  acetaminophen (TYLENOL) suppository 650 mg (650 mg Rectal Given 09/28/20 1847)    ED Course  I have reviewed the triage vital signs and the nursing notes.  Pertinent labs & imaging results that were available during my care of the patient were reviewed by me and considered in my medical decision making (see chart for details).  Clinical Course as of 09/28/20 2223  Tue Sep 28, 2020  2220 I spoke with Dr. Mikeal Hawthorne regarding admission.  [AW]    Clinical Course User Index [AW] Koleen Distance, MD   MDM Rules/Calculators/A&P                           Kimberly Gregory presented from a SNF with altered mental status. She was found to have a UTI and will be admitted for further management. No other source of infection noted. No hypotension.   Final Clinical Impression(s) / ED Diagnoses Final diagnoses:  Acute cystitis with hematuria  Delirium  Dementia without behavioral disturbance, unspecified dementia type [facility])    Rx / DC Orders ED Discharge Orders     None        Koleen Distance, MD 09/28/20 2227

## 2020-09-29 LAB — CORTISOL-AM, BLOOD: Cortisol - AM: 45.1 ug/dL — ABNORMAL HIGH (ref 6.7–22.6)

## 2020-09-29 LAB — BLOOD CULTURE ID PANEL (REFLEXED) - BCID2

## 2020-09-29 LAB — COMPREHENSIVE METABOLIC PANEL
ALT: 20 U/L (ref 0–44)
AST: 39 U/L (ref 15–41)
Albumin: 2.8 g/dL — ABNORMAL LOW (ref 3.5–5.0)
Alkaline Phosphatase: 71 U/L (ref 38–126)
Anion gap: 11 (ref 5–15)
BUN: 29 mg/dL — ABNORMAL HIGH (ref 8–23)
CO2: 21 mmol/L — ABNORMAL LOW (ref 22–32)
Calcium: 8.3 mg/dL — ABNORMAL LOW (ref 8.9–10.3)
Chloride: 111 mmol/L (ref 98–111)
Creatinine, Ser: 1.21 mg/dL — ABNORMAL HIGH (ref 0.44–1.00)
GFR, Estimated: 44 mL/min — ABNORMAL LOW (ref >60)
Glucose, Bld: 85 mg/dL (ref 70–99)
Potassium: 3.7 mmol/L (ref 3.5–5.1)
Sodium: 143 mmol/L (ref 135–145)
Total Bilirubin: 1.1 mg/dL (ref 0.3–1.2)
Total Protein: 7.3 g/dL (ref 6.5–8.1)

## 2020-09-29 LAB — CBC
HCT: 27.1 % — ABNORMAL LOW (ref 36.0–46.0)
Hemoglobin: 9.2 g/dL — ABNORMAL LOW (ref 12.0–15.0)
MCH: 26.4 pg (ref 26.0–34.0)
MCHC: 33.9 g/dL (ref 30.0–36.0)
MCV: 77.9 fL — ABNORMAL LOW (ref 80.0–100.0)
Platelets: 184 10*3/uL (ref 150–400)
RBC: 3.48 MIL/uL — ABNORMAL LOW (ref 3.87–5.11)
RDW: 19.9 % — ABNORMAL HIGH (ref 11.5–15.5)
WBC: 2.7 10*3/uL — ABNORMAL LOW (ref 4.0–10.5)
nRBC: 0 % (ref 0.0–0.2)

## 2020-09-29 LAB — LACTIC ACID, PLASMA
Lactic Acid, Venous: 4.4 mmol/L (ref 0.5–1.9)
Lactic Acid, Venous: 1.7 mmol/L (ref 0.5–1.9)

## 2020-09-29 LAB — PROTIME-INR
INR: 1.4 — ABNORMAL HIGH (ref 0.8–1.2)
Prothrombin Time: 17 seconds — ABNORMAL HIGH (ref 11.4–15.2)

## 2020-09-29 LAB — PROCALCITONIN: Procalcitonin: 7.41 ng/mL

## 2020-09-29 MED ORDER — ENSURE ENLIVE PO LIQD
237.0000 mL | ORAL | Status: DC
  Administered 2020-09-29 – 2020-10-03 (×5): 237 mL via ORAL
  Filled 2020-09-29 (×2): qty 237

## 2020-09-29 MED ORDER — ADULT MULTIVITAMIN W/MINERALS CH
1.0000 | ORAL_TABLET | Freq: Every day | ORAL | Status: DC
  Administered 2020-09-29 – 2020-10-03 (×4): 1 via ORAL
  Filled 2020-09-29 (×5): qty 1

## 2020-09-29 MED ORDER — LACTATED RINGERS IV SOLN: INTRAVENOUS | Status: DC

## 2020-09-29 MED ORDER — DIVALPROEX SODIUM 125 MG PO CSDR
250.0000 mg | DELAYED_RELEASE_CAPSULE | Freq: Three times a day (TID) | ORAL | Status: DC
  Administered 2020-09-29 – 2020-10-03 (×7): 250 mg via ORAL
  Filled 2020-09-29 (×15): qty 2

## 2020-09-29 MED ORDER — ARIPIPRAZOLE 2 MG PO TABS
2.0000 mg | ORAL_TABLET | Freq: Every day | ORAL | Status: DC
  Administered 2020-09-29 – 2020-10-03 (×4): 2 mg via ORAL
  Filled 2020-09-29 (×6): qty 1

## 2020-09-29 MED ORDER — MELATONIN 3 MG PO TABS
3.0000 mg | ORAL_TABLET | Freq: Every day | ORAL | Status: DC
  Administered 2020-09-29 – 2020-10-01 (×3): 3 mg via ORAL
  Filled 2020-09-29 (×3): qty 1

## 2020-09-29 MED ORDER — ONDANSETRON HCL 4 MG PO TABS: 4.0000 mg | ORAL_TABLET | Freq: Four times a day (QID) | ORAL | Status: DC | PRN

## 2020-09-29 MED ORDER — SODIUM CHLORIDE 0.9 % IV BOLUS
500.0000 mL | Freq: Once | INTRAVENOUS | Status: AC
  Administered 2020-09-29: 500 mL via INTRAVENOUS

## 2020-09-29 MED ORDER — LEVALBUTEROL HCL 0.63 MG/3ML IN NEBU: 0.6300 mg | INHALATION_SOLUTION | Freq: Four times a day (QID) | RESPIRATORY_TRACT | Status: DC | PRN

## 2020-09-29 MED ORDER — LACTATED RINGERS IV BOLUS
250.0000 mL | Freq: Once | INTRAVENOUS | Status: AC
  Administered 2020-09-30: 250 mL via INTRAVENOUS

## 2020-09-29 MED ORDER — ONDANSETRON HCL 4 MG/2ML IJ SOLN
4.0000 mg | Freq: Four times a day (QID) | INTRAMUSCULAR | Status: DC | PRN
  Administered 2020-09-29: 4 mg via INTRAVENOUS

## 2020-09-29 MED ORDER — ACETAMINOPHEN 650 MG RE SUPP
650.0000 mg | Freq: Four times a day (QID) | RECTAL | Status: DC | PRN
  Filled 2020-09-29: qty 1

## 2020-09-29 MED ORDER — LACTATED RINGERS IV BOLUS
250.0000 mL | Freq: Once | INTRAVENOUS | Status: AC
  Administered 2020-09-29: 250 mL via INTRAVENOUS

## 2020-09-29 MED ORDER — SODIUM CHLORIDE 0.9 % IV SOLN: 2.0000 g | Freq: Once | INTRAVENOUS | Status: DC

## 2020-09-29 MED ORDER — ACETAMINOPHEN 325 MG PO TABS: 650.0000 mg | ORAL_TABLET | Freq: Four times a day (QID) | ORAL | Status: DC | PRN

## 2020-09-29 MED ORDER — ENOXAPARIN SODIUM 40 MG/0.4ML IJ SOSY
40.0000 mg | PREFILLED_SYRINGE | INTRAMUSCULAR | Status: DC
  Administered 2020-09-29 – 2020-10-03 (×5): 40 mg via SUBCUTANEOUS
  Filled 2020-09-29 (×5): qty 0.4

## 2020-09-29 MED ORDER — SERTRALINE HCL 25 MG PO TABS
25.0000 mg | ORAL_TABLET | Freq: Every day | ORAL | Status: DC
  Administered 2020-09-29 – 2020-10-03 (×4): 25 mg via ORAL
  Filled 2020-09-29 (×5): qty 1

## 2020-09-29 NOTE — ED Notes (Signed)
Pt vomited.   

## 2020-09-29 NOTE — ED Notes (Signed)
Johann Capers, NP notified of pt BP.

## 2020-09-29 NOTE — Progress Notes (Addendum)
PROGRESS NOTE    Kimberly Gregory  WUJ:[id number] DOB: 12-29-1935 DOA: 09/28/2020 PCP: Lucky Cowboy, MD   Chief Complaint  Patient presents with   Altered Mental Status   Brief Narrative: 85 year-old female with history of dementia, hypertension, recent AKI, chronic kidney disease 3, GERD, history of ischemic colitis vertigo depression hyperlipidemia anemia of chronic disease with recent admission for UTI brought from the facility with altered mental status. In the ED she was febrile 102.1 blood pressure initially stable tachycardic with leukocytosis anemia COVID-19 negative UA showed cloudy and large leukocyte esterase nitrite positive WBC more than 50 chest x-ray no acute finding EKG no acute finding blood and urine cultures sent patient was placed on IV fluids and admitted for UTI/sepsis. She has lost IV access during night.  Subjective: Patient seen this morning.  Discussed with the bedside nursing staff. She has pulled her IV line out unable to infuse iv fluids, repeat lactic acid has been reordered and pending She is alert awake appears confused, not following commands, not agitated.  Assessment & Plan:  Acute metabolic encephalopathy in the setting of dementia Patient is alert awake does not follow commands confused.  She is not in distress. At baseline she knows her name and abel to recognize her family and is advanved and now in memory care place, dementia diagnosed 5 yrs.  E coli sepsis most likely due to UTI: Lactic acidosis noted this morning IV fluids bolus 250 ml was ordered but due to not having IV access patient has not received.  Admitting doctor noted transient hypotension that resolved.I  discussed with nursing staff IV team was consulted for IV access blood pressure stable in 90s to 100, I have reordered repeat lactic acid to reassess for fluid needs.  Continue on her cefepime based on previous urine culture report.  Her leukocytosis has resolved.  Pro-Cal is elevated  7.4. This afternoon- mico called-GNR in 1/2 bottles of the blood culture. E coli with no resistance detected(not ESBL.  Repeat lactic acid normla- ??falsely elevated lactic acid 4.4 2/2 shivering likley,   Recent Labs  Lab 09/28/20 1730 09/29/20 0400 09/29/20 0407 09/29/20 1031  WBC 12.3* 2.7*  --   --   LATICACIDVEN 1.6  --  4.4* 1.7  PROCALCITON  --  7.41  --   --     Recurrent UTIs see #2 GERD continue PPI Hypertension blood pressure soft hold meds.  NFA:OZHY code on admission.We will consult palliative care due to recurrent admissions sepsis. I spoke with her son this evening at 5 pm-who is HCPOA-he confirmed she is DNR.  Diet Order             Diet Heart Room service appropriate? Yes; Fluid consistency: Thin  Diet effective now                   Patient's Body mass index is 27.91 kg/m. DVT prophylaxis: enoxaparin (LOVENOX) injection 40 mg Start: 09/29/20 1000 Code Status:   Code Status: Full Code  Family Communication: plan of care discussed with patient at bedside. Status is: Inpatient Remains inpatient appropriate because:IV treatments appropriate due to intensity of illness or inability to take PO and Inpatient level of care appropriate due to severity of illness Dispo: The patient is from: SNF              Anticipated d/c is to: SNF              Patient currently is not medically stable  to d/c.   Difficult to place patient No  Unresulted Labs (From admission, onward)     Start     Ordered   10/05/20 0500  Creatinine, serum  (enoxaparin (LOVENOX)    CrCl >/= 30 ml/min)  Weekly,   R     Comments: while on enoxaparin therapy    09/29/20 0015   09/30/20 0500  Basic metabolic panel  Daily,   R     Question:  Specimen collection method  Answer:  Lab=Lab collect   09/29/20 0731   09/30/20 0500  CBC  Daily,   R     Question:  Specimen collection method  Answer:  Lab=Lab collect   09/29/20 0731   09/28/20 1654  Urine Culture  (Undifferentiated presentation  (screening labs and basic nursing orders))  ONCE - STAT,   STAT       Question:  Indication  Answer:  Sepsis   09/28/20 1654           Medications reviewed:  Scheduled Meds:  acetaminophen  650 mg Oral Once   ARIPiprazole  2 mg Oral Daily   divalproex  250 mg Oral TID with meals   enoxaparin (LOVENOX) injection  40 mg Subcutaneous Q24H   feeding supplement  237 mL Oral Q24H   melatonin  3 mg Oral QHS   multivitamin with minerals  1 tablet Oral Daily   sertraline  25 mg Oral Daily   Continuous Infusions:  ceFEPime (MAXIPIME) IV Stopped (09/29/20 1138)   lactated ringers 125 mL/hr at 09/29/20 0408   Consultants:see note  Procedures:see note Antimicrobials: Anti-infectives (From admission, onward)    Start     Dose/Rate Route Frequency Ordered Stop   09/29/20 1100  ceFEPIme (MAXIPIME) 2 g in sodium chloride 0.9 % 100 mL IVPB        2 g 200 mL/hr over 30 Minutes Intravenous Every 12 hours 09/28/20 2313     09/29/20 0030  ceFEPIme (MAXIPIME) 2 g in sodium chloride 0.9 % 100 mL IVPB  Status:  Discontinued        2 g 200 mL/hr over 30 Minutes Intravenous  Once 09/29/20 0015 09/29/20 0017   09/28/20 2215  ceFEPIme (MAXIPIME) 2 g in sodium chloride 0.9 % 100 mL IVPB        2 g 200 mL/hr over 30 Minutes Intravenous  Once 09/28/20 2213 09/29/20 0016      Culture/Microbiology    Component Value Date/Time   SDES  09/28/2020 1730    BLOOD RIGHT ANTECUBITAL Performed at [facility], [address], [place], [place] [address]    SPECREQUEST  09/28/2020 1730    BOTTLES DRAWN AEROBIC AND ANAEROBIC Blood Culture results may not be optimal due to an inadequate volume of blood received in culture bottles Performed at [facility], [address] [place], [place] [address]    Elpidio Anis NEGATIVE RODS 09/28/2020 1730   REPTSTATUS PENDING 09/28/2020 1730    Other culture-see note  Objective: Vitals: Today's Vitals   09/29/20 1130 09/29/20  1200 09/29/20 1230 09/29/20 1300  BP: (!) 93/45 90/72 (!) 110/55 (!) 96/56  Pulse: 79 71 79 82  Resp: 14 16 (!) 22 (!) 25  Temp:      TempSrc:      SpO2: 100% 100% 100% 99%  Weight:      Height:        Intake/Output Summary (Last 24 hours) at 09/29/2020 1351 Last data filed at 09/29/2020  0645 Gross per 24 hour  Intake 350 ml  Output --  Net 350 ml   Filed Weights   09/28/20 1659  Weight: 67 kg   Weight change:   Intake/Output from previous day: 07/19 0701 - 07/20 0700 In: 350 [IV Piggyback:350] Out: -  Intake/Output this shift: No intake/output data recorded. Filed Weights   09/28/20 1659  Weight: 67 kg   Examination: General exam: AAOX 0, HEENT:Oral mucosa moist, Ear/Nose WNL grossly,dentition normal. Respiratory system: bilaterally diminished, no use of accessory muscle, non tender. Cardiovascular system: S1 & S2 +,No JVD. Gastrointestinal system: Abdomen soft, NT,ND, BS+. Nervous System:Alert, awake, moving extremities well nonfocal does not follow command.   Extremities: NO edema, distal peripheral pulses palpable.  Skin: No rashes,no icterus. MSK: Normal muscle bulk,tone, power  Data Reviewed: I have personally reviewed following labs and imaging studies CBC: Recent Labs  Lab 09/28/20 1730 09/29/20 0400  WBC 12.3* 2.7*  NEUTROABS 10.2*  --   HGB 8.6* 9.2*  HCT 25.7* 27.1*  MCV 77.4* 77.9*  PLT 226 184   Basic Metabolic Panel: Recent Labs  Lab 09/28/20 1730 09/29/20 0400  NA 141 143  K 3.7 3.7  CL 110 111  CO2 24 21*  GLUCOSE 108* 85  BUN 23 29*  CREATININE 1.04* 1.21*  CALCIUM 8.3* 8.3*   GFR: Estimated Creatinine Clearance: 30.9 mL/min (A) (by C-G formula based on SCr of 1.21 mg/dL (H)). Liver Function Tests: Recent Labs  Lab 09/28/20 1730 09/29/20 0400  AST 28 39  ALT 18 20  ALKPHOS 52 71  BILITOT 0.8 1.1  PROT 6.8 7.3  ALBUMIN 2.9* 2.8*   No results for input(s): LIPASE, AMYLASE in the last 168 hours. No results for input(s):  AMMONIA in the last 168 hours. Coagulation Profile: Recent Labs  Lab 09/28/20 1730 09/29/20 0400  INR 1.2 1.4*   Cardiac Enzymes: No results for input(s): CKTOTAL, CKMB, CKMBINDEX, TROPONINI in the last 168 hours. BNP (last 3 results) No results for input(s): PROBNP in the last 8760 hours. HbA1C: No results for input(s): HGBA1C in the last 72 hours. CBG: No results for input(s): GLUCAP in the last 168 hours. Lipid Profile: No results for input(s): CHOL, HDL, LDLCALC, TRIG, CHOLHDL, LDLDIRECT in the last 72 hours. Thyroid Function Tests: No results for input(s): TSH, T4TOTAL, FREET4, T3FREE, THYROIDAB in the last 72 hours. Anemia Panel: No results for input(s): VITAMINB12, FOLATE, FERRITIN, TIBC, IRON, RETICCTPCT in the last 72 hours. Sepsis Labs: Recent Labs  Lab 09/28/20 1730 09/29/20 0400 09/29/20 0407 09/29/20 1031  PROCALCITON  --  7.41  --   --   LATICACIDVEN 1.6  --  4.4* 1.7    Recent Results (from the past 240 hour(s))  Blood culture (routine single)     Status: None (Preliminary result)   Collection Time: 09/28/20  5:30 PM   Specimen: BLOOD  Result Value Ref Range Status   Specimen Description   Final    BLOOD RIGHT ANTECUBITAL Performed at [facility], [address] [place], [place] [address]    Special Requests   Final    BOTTLES DRAWN AEROBIC AND ANAEROBIC Blood Culture results may not be optimal due to an inadequate volume of blood received in culture bottles Performed at [facility], [address], [place], [place] [address]    Culture  Setup Time   Final    GRAM NEGATIVE RODS AEROBIC BOTTLE ONLY CRITICAL RESULT CALLED TO, READ BACK BY AND VERIFIED WITH:  PHARMD T GREEN [id number] AT 1244 BY CM Performed at [facility], [address], [place], [place] [address]    Culture GRAM NEGATIVE RODS  Final   Report Status PENDING  Incomplete  Blood Culture ID Panel (Reflexed)     Status: Abnormal   Collection  Time: 09/28/20  5:30 PM  Result Value Ref Range Status   Enterococcus faecalis NOT DETECTED NOT DETECTED Final   Enterococcus Faecium NOT DETECTED NOT DETECTED Final   Listeria monocytogenes NOT DETECTED NOT DETECTED Final   Staphylococcus species NOT DETECTED NOT DETECTED Final   Staphylococcus aureus (BCID) NOT DETECTED NOT DETECTED Final   Staphylococcus epidermidis NOT DETECTED NOT DETECTED Final   Staphylococcus lugdunensis NOT DETECTED NOT DETECTED Final   Streptococcus species NOT DETECTED NOT DETECTED Final   Streptococcus agalactiae NOT DETECTED NOT DETECTED Final   Streptococcus pneumoniae NOT DETECTED NOT DETECTED Final   Streptococcus pyogenes NOT DETECTED NOT DETECTED Final   A.calcoaceticus-baumannii NOT DETECTED NOT DETECTED Final   Bacteroides fragilis NOT DETECTED NOT DETECTED Final   Enterobacterales DETECTED (A) NOT DETECTED Final    Comment: Enterobacterales represent a large order of gram negative bacteria, not a single organism. CRITICAL RESULT CALLED TO, READ BACK BY AND VERIFIED WITH: PHARMD T GREEN [id number] AR 1242 BY CM    Enterobacter cloacae complex NOT DETECTED NOT DETECTED Final   Escherichia coli DETECTED (A) NOT DETECTED Final    Comment: CRITICAL RESULT CALLED TO, READ BACK BY AND VERIFIED WITH: PHARMD T GREEN [id number] AT 1242 BY CM    Klebsiella aerogenes NOT DETECTED NOT DETECTED Final   Klebsiella oxytoca NOT DETECTED NOT DETECTED Final   Klebsiella pneumoniae NOT DETECTED NOT DETECTED Final   Proteus species NOT DETECTED NOT DETECTED Final   Salmonella species NOT DETECTED NOT DETECTED Final   Serratia marcescens NOT DETECTED NOT DETECTED Final   Haemophilus influenzae NOT DETECTED NOT DETECTED Final   Neisseria meningitidis NOT DETECTED NOT DETECTED Final   Pseudomonas aeruginosa NOT DETECTED NOT DETECTED Final   Stenotrophomonas maltophilia NOT DETECTED NOT DETECTED Final   Candida albicans NOT DETECTED NOT DETECTED Final   Candida auris NOT  DETECTED NOT DETECTED Final   Candida glabrata NOT DETECTED NOT DETECTED Final   Candida krusei NOT DETECTED NOT DETECTED Final   Candida parapsilosis NOT DETECTED NOT DETECTED Final   Candida tropicalis NOT DETECTED NOT DETECTED Final   Cryptococcus neoformans/gattii NOT DETECTED NOT DETECTED Final   CTX-M ESBL NOT DETECTED NOT DETECTED Final   Carbapenem resistance IMP NOT DETECTED NOT DETECTED Final   Carbapenem resistance KPC NOT DETECTED NOT DETECTED Final   Carbapenem resistance NDM NOT DETECTED NOT DETECTED Final   Carbapenem resist OXA 48 LIKE NOT DETECTED NOT DETECTED Final   Carbapenem resistance VIM NOT DETECTED NOT DETECTED Final    Comment: Performed at [facility], [address] [place], [place] [address]  Resp Panel by RT-PCR (Flu A&B, Covid) Nasopharyngeal Swab     Status: None   Collection Time: 09/28/20  5:38 PM   Specimen: Nasopharyngeal Swab; Nasopharyngeal(NP) swabs in vial transport medium  Result Value Ref Range Status   SARS Coronavirus 2 by RT PCR NEGATIVE NEGATIVE Final    Comment: (NOTE) SARS-CoV-2 target nucleic acids are NOT DETECTED.  The SARS-CoV-2 RNA is generally detectable in upper respiratory specimens during the acute phase of infection. The lowest concentration of SARS-CoV-2 viral copies this assay can detect is 138 copies/mL. A negative result does not preclude  SARS-Cov-2 infection and should not be used as the sole basis for treatment or other patient management decisions. A negative result may occur with  improper specimen collection/handling, submission of specimen other than nasopharyngeal swab, presence of viral mutation(s) within the areas targeted by this assay, and inadequate number of viral copies(<138 copies/mL). A negative result must be combined with clinical observations, patient history, and epidemiological information. The expected result is Negative.  Fact Sheet for Patients:   BloggerCourse.comhttps://www.fda.gov/media/152166/download  Fact Sheet for Healthcare Providers:  SeriousBroker.ithttps://www.fda.gov/media/152162/download  This test is no t yet approved or cleared by the [place] FDA and  has been authorized for detection and/or diagnosis of SARS-CoV-2 by FDA under an Emergency Use Authorization (EUA). This EUA will remain  in effect (meaning this test can be used) for the duration of the COVID-19 declaration under Section 564(b)(1) of the Act, 21 U.S.C.section 360bbb-3(b)(1), unless the authorization is terminated  or revoked sooner.       Influenza A by PCR NEGATIVE NEGATIVE Final   Influenza B by PCR NEGATIVE NEGATIVE Final    Comment: (NOTE) The Xpert Xpress SARS-CoV-2/FLU/RSV plus assay is intended as an aid in the diagnosis of influenza from Nasopharyngeal swab specimens and should not be used as a sole basis for treatment. Nasal washings and aspirates are unacceptable for Xpert Xpress SARS-CoV-2/FLU/RSV testing.  Fact Sheet for Patients: BloggerCourse.comhttps://www.fda.gov/media/152166/download  Fact Sheet for Healthcare Providers: SeriousBroker.ithttps://www.fda.gov/media/152162/download  This test is not yet approved or cleared by the [place] FDA and has been authorized for detection and/or diagnosis of SARS-CoV-2 by FDA under an Emergency Use Authorization (EUA). This EUA will remain in effect (meaning this test can be used) for the duration of the COVID-19 declaration under Section 564(b)(1) of the Act, 21 U.S.C. section 360bbb-3(b)(1), unless the authorization is terminated or revoked.  Performed at [facility], [address] [place], [place] [id number]      [facility]  Result Date: 09/28/2020 CLINICAL DATA:  Questionable sepsis - evaluate for abnormality Combative patient with altered mental status. EXAM: PORTABLE CHEST 1 VIEW COMPARISON:  09/08/2020 FINDINGS: Patient is rotated. Stable heart size and mediastinal contours  allowing for rotation. There is mild bibasilar atelectasis. No confluent consolidation. No pulmonary edema, large pleural effusion or pneumothorax. No acute osseous abnormalities are seen. IMPRESSION: Mild bibasilar atelectasis. Electronically Signed   By: Narda RutherfordMelanie  Sanford M.D.   On: 09/28/2020 21:48     LOS: 1 day   Lanae Boastamesh Christyn Gutkowski, MD [facility]  09/29/2020, 1:51 PM

## 2020-09-29 NOTE — ED Notes (Signed)
Pt pulled out IV again. Will coban and mitt Pt once IV re established

## 2020-09-29 NOTE — Progress Notes (Signed)
PHARMACY - PHYSICIAN COMMUNICATION CRITICAL VALUE ALERT - BLOOD CULTURE IDENTIFICATION (BCID)  Kimberly Gregory is an 85 y.o. female who presented to [facility] on 09/28/2020 with a chief complaint of sepsis due to UTI.   Assessment: Blood culture with E coli non-ESBL in 1/2 bottles suspected urinary source   Name of physician (or Provider) Contacted: Dr. Jonathon Bellows  Current antibiotics: cefepime  Changes to prescribed antibiotics recommended:  Continue cefepime for now with plans to possibly transition to ceftriaxone tomorrow.   Results for orders placed or performed during the hospital encounter of 09/28/20  Blood Culture ID Panel (Reflexed) (Collected: 09/28/2020  5:30 PM)  Result Value Ref Range   Enterococcus faecalis NOT DETECTED NOT DETECTED   Enterococcus Faecium NOT DETECTED NOT DETECTED   Listeria monocytogenes NOT DETECTED NOT DETECTED   Staphylococcus species NOT DETECTED NOT DETECTED   Staphylococcus aureus (BCID) NOT DETECTED NOT DETECTED   Staphylococcus epidermidis NOT DETECTED NOT DETECTED   Staphylococcus lugdunensis NOT DETECTED NOT DETECTED   Streptococcus species NOT DETECTED NOT DETECTED   Streptococcus agalactiae NOT DETECTED NOT DETECTED   Streptococcus pneumoniae NOT DETECTED NOT DETECTED   Streptococcus pyogenes NOT DETECTED NOT DETECTED   A.calcoaceticus-baumannii NOT DETECTED NOT DETECTED   Bacteroides fragilis NOT DETECTED NOT DETECTED   Enterobacterales DETECTED (A) NOT DETECTED   Enterobacter cloacae complex NOT DETECTED NOT DETECTED   Escherichia coli DETECTED (A) NOT DETECTED   Klebsiella aerogenes NOT DETECTED NOT DETECTED   Klebsiella oxytoca NOT DETECTED NOT DETECTED   Klebsiella pneumoniae NOT DETECTED NOT DETECTED   Proteus species NOT DETECTED NOT DETECTED   Salmonella species NOT DETECTED NOT DETECTED   Serratia marcescens NOT DETECTED NOT DETECTED   Haemophilus influenzae NOT DETECTED NOT DETECTED   Neisseria meningitidis NOT DETECTED NOT  DETECTED   Pseudomonas aeruginosa NOT DETECTED NOT DETECTED   Stenotrophomonas maltophilia NOT DETECTED NOT DETECTED   Candida albicans NOT DETECTED NOT DETECTED   Candida auris NOT DETECTED NOT DETECTED   Candida glabrata NOT DETECTED NOT DETECTED   Candida krusei NOT DETECTED NOT DETECTED   Candida parapsilosis NOT DETECTED NOT DETECTED   Candida tropicalis NOT DETECTED NOT DETECTED   Cryptococcus neoformans/gattii NOT DETECTED NOT DETECTED   CTX-M ESBL NOT DETECTED NOT DETECTED   Carbapenem resistance IMP NOT DETECTED NOT DETECTED   Carbapenem resistance KPC NOT DETECTED NOT DETECTED   Carbapenem resistance NDM NOT DETECTED NOT DETECTED   Carbapenem resist OXA 48 LIKE NOT DETECTED NOT DETECTED   Carbapenem resistance VIM NOT DETECTED NOT DETECTED    [name] 09/29/2020  1:42 PM

## 2020-09-29 NOTE — ED Notes (Signed)
Upon entering pt room, pt is shaking and wheezing. Pt temperature 99.9. Dr. Mikeal Hawthorne notified of wheezing. Awaiting medications

## 2020-09-29 NOTE — Progress Notes (Addendum)
   OVERNIGHT PROGRESS REPORT  IV Fluid bolus ordered due to trend of BP and cause for admitting diagnosis sepsis after notification by RN Patient is in no obvious or stated distress.  Will follow up during/after fluid.  Chinita Greenland MSNA ACNPC-AG Acute Care Nurse Practitioner [facility]

## 2020-09-29 NOTE — ED Notes (Signed)
Pt had BM. Pt linen changed and cleaned. Purewick removed due to being soiled. Brief placed on pt. Awaiting Dr. Mikeal Hawthorne for orders for wheezing

## 2020-09-29 NOTE — ED Notes (Signed)
Breakfast tray provided. 

## 2020-09-29 NOTE — ED Notes (Signed)
Lunch tray provided. 

## 2020-09-30 ENCOUNTER — Other Ambulatory Visit: Payer: Self-pay

## 2020-09-30 DIAGNOSIS — N390 Urinary tract infection, site not specified: Secondary | ICD-10-CM | POA: Diagnosis not present

## 2020-09-30 DIAGNOSIS — A419 Sepsis, unspecified organism: Secondary | ICD-10-CM | POA: Diagnosis not present

## 2020-09-30 LAB — CBC
HCT: 23.4 % — ABNORMAL LOW (ref 36.0–46.0)
Hemoglobin: 7.8 g/dL — ABNORMAL LOW (ref 12.0–15.0)
MCH: 26.1 pg (ref 26.0–34.0)
MCHC: 33.3 g/dL (ref 30.0–36.0)
MCV: 78.3 fL — ABNORMAL LOW (ref 80.0–100.0)
Platelets: 174 10*3/uL (ref 150–400)
RBC: 2.99 MIL/uL — ABNORMAL LOW (ref 3.87–5.11)
RDW: 19.8 % — ABNORMAL HIGH (ref 11.5–15.5)
WBC: 17.9 10*3/uL — ABNORMAL HIGH (ref 4.0–10.5)
nRBC: 0 % (ref 0.0–0.2)

## 2020-09-30 LAB — BASIC METABOLIC PANEL
Anion gap: 8 (ref 5–15)
BUN: 31 mg/dL — ABNORMAL HIGH (ref 8–23)
CO2: 25 mmol/L (ref 22–32)
Calcium: 8.2 mg/dL — ABNORMAL LOW (ref 8.9–10.3)
Chloride: 107 mmol/L (ref 98–111)
Creatinine, Ser: 1.18 mg/dL — ABNORMAL HIGH (ref 0.44–1.00)
GFR, Estimated: 46 mL/min — ABNORMAL LOW (ref >60)
Glucose, Bld: 89 mg/dL (ref 70–99)
Potassium: 3.6 mmol/L (ref 3.5–5.1)
Sodium: 140 mmol/L (ref 135–145)

## 2020-09-30 LAB — RETICULOCYTES
Immature Retic Fract: 13.4 % (ref 2.3–15.9)
RBC.: 3.12 MIL/uL — ABNORMAL LOW (ref 3.87–5.11)
Retic Count, Absolute: 41.5 10*3/uL (ref 19.0–186.0)
Retic Ct Pct: 1.3 % (ref 0.4–3.1)

## 2020-09-30 LAB — IRON AND TIBC: Iron: <5 ug/dL — ABNORMAL LOW (ref 28–170)

## 2020-09-30 LAB — FOLATE: Folate: 12.4 ng/mL (ref >5.9)

## 2020-09-30 LAB — VITAMIN B12: Vitamin B-12: 381 pg/mL (ref 180–914)

## 2020-09-30 LAB — FERRITIN: Ferritin: 37 ng/mL (ref 11–307)

## 2020-09-30 MED ORDER — BOOST / RESOURCE BREEZE PO LIQD CUSTOM
1.0000 | ORAL | Status: DC
  Administered 2020-09-30 – 2020-10-02 (×3): 1 via ORAL

## 2020-09-30 MED ORDER — SODIUM CHLORIDE 0.9 % IV SOLN
2.0000 g | INTRAVENOUS | Status: DC
  Administered 2020-09-30: 2 g via INTRAVENOUS
  Filled 2020-09-30: qty 2

## 2020-09-30 NOTE — Plan of Care (Signed)
  Problem: Clinical Measurements: Goal: Ability to maintain clinical measurements within normal limits will improve Outcome: Progressing Goal: Diagnostic test results will improve Outcome: Progressing   Problem: Nutrition: Goal: Adequate nutrition will be maintained Outcome: Progressing   Problem: Pain Managment: Goal: General experience of comfort will improve Outcome: Progressing   Problem: Safety: Goal: Ability to remain free from injury will improve Outcome: Progressing   

## 2020-09-30 NOTE — Consult Note (Signed)
[facility]       Reason for Consult: E coli bacteremia   Referring Physician: CHAMP autoconsult  Principal Problem:   Sepsis secondary to UTI [facility]) Active Problems:   Benign essential HTN   Dementia with behavioral disturbance (HCC)   Gastroesophageal reflux disease without esophagitis   Urinary tract infection with hematuria   Hypotension    acetaminophen  650 mg Oral Once   ARIPiprazole  2 mg Oral Daily   divalproex  250 mg Oral TID with meals   enoxaparin (LOVENOX) injection  40 mg Subcutaneous Q24H   feeding supplement  237 mL Oral Q24H   melatonin  3 mg Oral QHS   multivitamin with minerals  1 tablet Oral Daily   sertraline  25 mg Oral Daily    Recommendations: Change to ceftriaxone pending sensitivities  Will need 7 days of treatment  Assessment: She has E coli in urine and blood cultures c/w a urinary source.  On antibiotics and will continue.  Can change to appropriate oral antibiotics at discharge   Antibiotics: Cefepime day 3   HPI: EMMAH BRATCHER is a 85 y.o. female with dementia, HTN, chronic kidney came in with fever and now with bacteremia from a urinary source. No history obtainable from the patient.  Son at the bedside and states she was not feeling herself.  Now seems more at her baseline.  Unknown if there was any dysuria or urinary complaints.     Review of Systems:  Unable to be assessed due to patient factors  Past Medical History:  Diagnosis Date   Acute encephalopathy 06/13/2016   AKI (acute kidney injury) (HCC) 06/13/2016   Anemia 09/09/2020   Arthritis    Chronic kidney disease    Dementia (HCC)    Depression 09/11/2018   Gastroesophageal reflux disease without esophagitis 09/11/2018   Headache    Hypercholesterolemia    Hypertension    Ischemic colitis (HCC) 10/24/2016   Laxative abuse 10/21/2016   SIRS (systemic inflammatory response syndrome) (HCC) 10/21/2016   Vertigo 10/14/2014    Social History    Tobacco Use   Smoking status: Former    Packs/day: 0.50    Years: 55.00    Pack years: 27.50    Types: Cigarettes    Quit date: 07/12/2019    Years since quitting: 1.2   Smokeless tobacco: Never   Tobacco comments:    Stopped smoking May 2021 per family  Vaping Use   Vaping Use: Never used  Substance Use Topics   Alcohol use: No   Drug use: No    FMH: unobtainable from the patient  No Known Allergies  Physical Exam: Constitutional: in no apparent distress  Vitals:   09/30/20 0558 09/30/20 1018  BP: (!) 112/54 (!) 105/50  Pulse: 77 76  Resp: 20 18  Temp: (!) 97.5 F (36.4 C) 98.6 F (37 C)  SpO2: 92% 98%   EYES: anicteric ENMT: no thrush Cardiovascular: Cor RRR Respiratory: clear; Musculoskeletal: no pedal edema noted Skin: no rash Hematologic: + dementia  Lab Results  Component Value Date   WBC 17.9 (H) 09/30/2020   HGB 7.8 (L) 09/30/2020   HCT 23.4 (L) 09/30/2020   MCV 78.3 (L) 09/30/2020   PLT 174 09/30/2020    Lab Results  Component Value Date   CREATININE 1.18 (H) 09/30/2020   BUN 31 (H) 09/30/2020   NA 140 09/30/2020   K 3.6 09/30/2020   CL 107 09/30/2020  CO2 25 09/30/2020    Lab Results  Component Value Date   ALT 20 09/29/2020   AST 39 09/29/2020   ALKPHOS 71 09/29/2020     Microbiology: Recent Results (from the past 240 hour(s))  Blood culture (routine single)     Status: Abnormal (Preliminary result)   Collection Time: 09/28/20  5:30 PM   Specimen: BLOOD  Result Value Ref Range Status   Specimen Description   Final    BLOOD RIGHT ANTECUBITAL Performed at [facility], [address] [place], [place] [address]    Special Requests   Final    BOTTLES DRAWN AEROBIC AND ANAEROBIC Blood Culture results may not be optimal due to an inadequate volume of blood received in culture bottles Performed at [facility], [address] [place], [place] [address]    Culture  Setup Time   Final    GRAM  NEGATIVE RODS AEROBIC BOTTLE ONLY CRITICAL RESULT CALLED TO, READ BACK BY AND VERIFIED WITH: PHARMD T GREEN [id number] AT 1244 BY CM    Culture (A)  Final    ESCHERICHIA COLI SUSCEPTIBILITIES TO FOLLOW Performed at [facility], [address] [place], [place] [address]    Report Status PENDING  Incomplete  Blood Culture ID Panel (Reflexed)     Status: Abnormal   Collection Time: 09/28/20  5:30 PM  Result Value Ref Range Status   Enterococcus faecalis NOT DETECTED NOT DETECTED Final   Enterococcus Faecium NOT DETECTED NOT DETECTED Final   Listeria monocytogenes NOT DETECTED NOT DETECTED Final   Staphylococcus species NOT DETECTED NOT DETECTED Final   Staphylococcus aureus (BCID) NOT DETECTED NOT DETECTED Final   Staphylococcus epidermidis NOT DETECTED NOT DETECTED Final   Staphylococcus lugdunensis NOT DETECTED NOT DETECTED Final   Streptococcus species NOT DETECTED NOT DETECTED Final   Streptococcus agalactiae NOT DETECTED NOT DETECTED Final   Streptococcus pneumoniae NOT DETECTED NOT DETECTED Final   Streptococcus pyogenes NOT DETECTED NOT DETECTED Final   A.calcoaceticus-baumannii NOT DETECTED NOT DETECTED Final   Bacteroides fragilis NOT DETECTED NOT DETECTED Final   Enterobacterales DETECTED (A) NOT DETECTED Final    Comment: Enterobacterales represent a large order of gram negative bacteria, not a single organism. CRITICAL RESULT CALLED TO, READ BACK BY AND VERIFIED WITH: PHARMD T GREEN [id number] AR 1242 BY CM    Enterobacter cloacae complex NOT DETECTED NOT DETECTED Final   Escherichia coli DETECTED (A) NOT DETECTED Final    Comment: CRITICAL RESULT CALLED TO, READ BACK BY AND VERIFIED WITH: PHARMD T GREEN [id number] AT 1242 BY CM    Klebsiella aerogenes NOT DETECTED NOT DETECTED Final   Klebsiella oxytoca NOT DETECTED NOT DETECTED Final   Klebsiella pneumoniae NOT DETECTED NOT DETECTED Final   Proteus species NOT DETECTED NOT DETECTED Final   Salmonella species NOT  DETECTED NOT DETECTED Final   Serratia marcescens NOT DETECTED NOT DETECTED Final   Haemophilus influenzae NOT DETECTED NOT DETECTED Final   Neisseria meningitidis NOT DETECTED NOT DETECTED Final   Pseudomonas aeruginosa NOT DETECTED NOT DETECTED Final   Stenotrophomonas maltophilia NOT DETECTED NOT DETECTED Final   Candida albicans NOT DETECTED NOT DETECTED Final   Candida auris NOT DETECTED NOT DETECTED Final   Candida glabrata NOT DETECTED NOT DETECTED Final   Candida krusei NOT DETECTED NOT DETECTED Final   Candida parapsilosis NOT DETECTED NOT DETECTED Final   Candida tropicalis NOT DETECTED NOT DETECTED Final   Cryptococcus neoformans/gattii NOT DETECTED NOT DETECTED Final  CTX-M ESBL NOT DETECTED NOT DETECTED Final   Carbapenem resistance IMP NOT DETECTED NOT DETECTED Final   Carbapenem resistance KPC NOT DETECTED NOT DETECTED Final   Carbapenem resistance NDM NOT DETECTED NOT DETECTED Final   Carbapenem resist OXA 48 LIKE NOT DETECTED NOT DETECTED Final   Carbapenem resistance VIM NOT DETECTED NOT DETECTED Final    Comment: Performed at [facility], [address] [place], [place] [address]  Resp Panel by RT-PCR (Flu A&B, Covid) Nasopharyngeal Swab     Status: None   Collection Time: 09/28/20  5:38 PM   Specimen: Nasopharyngeal Swab; Nasopharyngeal(NP) swabs in vial transport medium  Result Value Ref Range Status   SARS Coronavirus 2 by RT PCR NEGATIVE NEGATIVE Final    Comment: (NOTE) SARS-CoV-2 target nucleic acids are NOT DETECTED.  The SARS-CoV-2 RNA is generally detectable in upper respiratory specimens during the acute phase of infection. The lowest concentration of SARS-CoV-2 viral copies this assay can detect is 138 copies/mL. A negative result does not preclude SARS-Cov-2 infection and should not be used as the sole basis for treatment or other patient management decisions. A negative result may occur with  improper specimen collection/handling, submission  of specimen other than nasopharyngeal swab, presence of viral mutation(s) within the areas targeted by this assay, and inadequate number of viral copies(<138 copies/mL). A negative result must be combined with clinical observations, patient history, and epidemiological information. The expected result is Negative.  Fact Sheet for Patients:  BloggerCourse.comhttps://www.fda.gov/media/152166/download  Fact Sheet for Healthcare Providers:  SeriousBroker.ithttps://www.fda.gov/media/152162/download  This test is no t yet approved or cleared by the [place] FDA and  has been authorized for detection and/or diagnosis of SARS-CoV-2 by FDA under an Emergency Use Authorization (EUA). This EUA will remain  in effect (meaning this test can be used) for the duration of the COVID-19 declaration under Section 564(b)(1) of the Act, 21 U.S.C.section 360bbb-3(b)(1), unless the authorization is terminated  or revoked sooner.       Influenza A by PCR NEGATIVE NEGATIVE Final   Influenza B by PCR NEGATIVE NEGATIVE Final    Comment: (NOTE) The Xpert Xpress SARS-CoV-2/FLU/RSV plus assay is intended as an aid in the diagnosis of influenza from Nasopharyngeal swab specimens and should not be used as a sole basis for treatment. Nasal washings and aspirates are unacceptable for Xpert Xpress SARS-CoV-2/FLU/RSV testing.  Fact Sheet for Patients: BloggerCourse.comhttps://www.fda.gov/media/152166/download  Fact Sheet for Healthcare Providers: SeriousBroker.ithttps://www.fda.gov/media/152162/download  This test is not yet approved or cleared by the [place] FDA and has been authorized for detection and/or diagnosis of SARS-CoV-2 by FDA under an Emergency Use Authorization (EUA). This EUA will remain in effect (meaning this test can be used) for the duration of the COVID-19 declaration under Section 564(b)(1) of the Act, 21 U.S.C. section 360bbb-3(b)(1), unless the authorization is terminated or revoked.  Performed at [facility], [address] [place], [place] [address]   Urine Culture     Status: Abnormal (Preliminary result)   Collection Time: 09/28/20  9:00 PM   Specimen: In/Out Cath Urine  Result Value Ref Range Status   Specimen Description   Final    IN/OUT CATH URINE Performed at [facility], [address] [place], [place] [address]    Special Requests   Final    NONE Performed at [facility], [address] [place], [place] [address]    Culture (A)  Final    >=100,000 COLONIES/mL ESCHERICHIA COLI SUSCEPTIBILITIES  TO FOLLOW CULTURE REINCUBATED FOR BETTER GROWTH Performed at [facility], [address] [place], [place] [address]    Report Status PENDING  Incomplete    Gardiner Barefoot, MD [facility] for Infectious Disease [facility] [email or web address] 09/30/2020, 12:49 PM

## 2020-09-30 NOTE — Progress Notes (Signed)
Initial Nutrition Assessment  DOCUMENTATION CODES:   Not applicable  INTERVENTION:  - continue Ensure Enlive once/day, each supplement provides 350 kcal and 20 grams of protein - will order Boost Breeze once/day, each supplement provides 250 kcal and 9 grams of protein.   NUTRITION DIAGNOSIS:   Increased nutrient needs related to acute illness as evidenced by estimated needs.  GOAL:   Patient will meet greater than or equal to 90% of their needs  MONITOR:   PO intake, Supplement acceptance, Labs, Weight trends  REASON FOR ASSESSMENT:   Malnutrition Screening Tool  ASSESSMENT:   85 year-old female with medical history of dementia, HTN, recent AKI, stage 3 CKD GERD, hx of ischemic colitis, vertigo, depression, HLD, anemia of chronic disease, and recent admission for UTI. She presented to the ED from the facility with AMS. She was started on IV fluids in the ED and admitted for sepsis 2/2 UTI.  Patient known to this RD from admission at the beginning of the month. Patient is noted to be a/o x1. She is from a Memory Care unit.   She ate 50% of breakfast today. Ensure Enlive ordered once/day and patient has accepted both bottles offered since admission.   Weight on 7/19 was 148 lb and weight has been stable since 08/13/18.   Per notes: - acute metabolic encephalopathy for patient with hx of dementia - ecoli sepsis thought to be 2/2 UTI - recurrent UTIs - Palliative Care consulted.     Labs reviewed; BUN: 31 mg/dl, creatinine: 6.43 mg/dl, Ca: 8.2 mg/dl, GFR: 46 ml/min. Medications reviewed; 3 mg melatonin/day, 1 tablet multivitamin with minerals/day. IVF; LR @ 125 ml/hr.    NUTRITION - FOCUSED PHYSICAL EXAM:  Flowsheet Row Most Recent Value  Orbital Region No depletion  Upper Arm Region No depletion  Thoracic and Lumbar Region Unable to assess  Buccal Region No depletion  Temple Region No depletion  Clavicle Bone Region No depletion  Clavicle and Acromion Bone Region  No depletion  Scapular Bone Region Unable to assess  Dorsal Hand No depletion  Patellar Region No depletion  Anterior Thigh Region Unable to assess  Posterior Calf Region No depletion  Edema (RD Assessment) Mild  [BLE]  Hair Reviewed  Eyes Reviewed  Mouth Reviewed  Skin Reviewed  Nails Reviewed       Diet Order:   Diet Order             Diet Heart Room service appropriate? Yes; Fluid consistency: Thin  Diet effective now                   EDUCATION NEEDS:   No education needs have been identified at this time  Skin:     Last BM:  PTA/unknown  Height:   Ht Readings from Last 1 Encounters:  09/28/20 5\' 1"  (1.549 m)    Weight:   Wt Readings from Last 1 Encounters:  09/28/20 67 kg      Estimated Nutritional Needs:  Kcal:  1700-1900 kcal Protein:  80-95 grams Fluid:  >/= 1.8 L/day       09/30/20, MS, RD, LDN, CNSC Inpatient Clinical Dietitian RD pager # available in [facility]  After hours/weekend pager # available in [facility]

## 2020-09-30 NOTE — TOC Initial Note (Signed)
Transition of Care [facility]) - Initial/Assessment Note    Patient Details  Name: Kimberly Gregory MRN: [id number] Date of Birth: 1935/04/02  [facility]) CM/SW Contact:    Darleene Cleaver, LCSW Phone Number: 09/30/2020, 3:30 PM  Clinical Narrative:                  Patient is from [facility] care.  Patient has been living there for several months.  Patient plans to return back to facility once medically ready for discharge.  Expected Discharge Plan: Memory Care Barriers to Discharge: Continued Medical Work up   Patient Goals and CMS Choice Patient states their goals for this hospitalization and ongoing recovery are:: To return back to [facility] ALF. CMS Medicare.gov Compare Post Acute Care list provided to:: Other (Comment Required) (Chart review)    Expected Discharge Plan and Services Expected Discharge Plan: Memory Care       Living arrangements for the past 2 months: Assisted Living Facility                                      Prior Living Arrangements/Services Living arrangements for the past 2 months: Assisted Living Facility Lives with:: Facility Resident Patient language and need for interpreter reviewed:: Yes Do you feel safe going back to the place where you live?: Yes      Need for Family Participation in Patient Care: Yes (Comment) Care giver support system in place?: Yes (comment)   Criminal Activity/Legal Involvement Pertinent to Current Situation/Hospitalization: No - Comment as needed  Activities of Daily Living Home Assistive Devices/Equipment: Other (Comment) (pt states she uses a walker but unable to state any other equipment) ADL Screening (condition at time of admission) Patient's cognitive ability adequate to safely complete daily activities?: No Is the patient deaf or have difficulty hearing?: No Does the patient have difficulty seeing, even when wearing glasses/contacts?: No Does the patient have  difficulty concentrating, remembering, or making decisions?: Yes Patient able to express need for assistance with ADLs?: Yes Does the patient have difficulty dressing or bathing?: Yes Independently performs ADLs?: No Communication: Independent Dressing (OT): Needs assistance Is this a change from baseline?: Pre-admission baseline Grooming: Independent Feeding: Independent Bathing: Needs assistance Is this a change from baseline?: Pre-admission baseline Toileting: Needs assistance Is this a change from baseline?: Pre-admission baseline In/Out Bed: Needs assistance Is this a change from baseline?: Pre-admission baseline Walks in Home: Needs assistance Is this a change from baseline?: Pre-admission baseline Does the patient have difficulty walking or climbing stairs?: Yes Weakness of Legs: Right (pt complains of right leg pain) Weakness of Arms/Hands: None  Permission Sought/Granted   Permission granted to share information with : Yes, Release of Information Signed  Share Information with NAME: Patient's son Gaynelle Arabian and daughter [name].  Permission granted to share info w AGENCY: Memory Care ALF        Emotional Assessment Appearance:: Appears stated age   Affect (typically observed): Accepting, Calm Orientation: : Oriented to Self Alcohol / Substance Use: Not Applicable Psych Involvement: No (comment)  Admission diagnosis:  Delirium [R41.0] Acute cystitis with hematuria [N30.01] Sepsis secondary to UTI (HCC) [A41.9, N39.0] Dementia without behavioral disturbance, unspecified dementia type (HCC) [F03.90] Patient Active Problem List   Diagnosis Date Noted   Sepsis secondary to UTI (HCC) 09/28/2020   Aortic arch atherosclerosis (HCC) CXR 09/2020 09/17/2020   Sepsis (HCC)  09/09/2020   Urinary tract infection with hematuria 09/09/2020   Anemia 09/09/2020   Nausea and vomiting 09/09/2020   Hypotension 09/09/2020   CKD (chronic kidney disease) stage 3, GFR 30-59 ml/min (HCC)  09/09/2020   Visual hallucinations 08/18/2019   Gastroesophageal reflux disease without esophagitis 09/11/2018   Former smoker 09/11/2018   Vitamin D deficiency 09/11/2018   Depressive disorder in remission (HCC) 09/11/2018   Dementia with behavioral disturbance (HCC) 10/21/2016   Benign essential HTN 10/14/2014   Hyperlipidemia 10/14/2014   PCP:  Lucky Cowboy, MD Pharmacy:   [facility] [place], [place] - [address] [address] [place] [address] Phone: [phone] Fax: [phone]     Social Determinants of Health (SDOH) Interventions    Readmission Risk Interventions No flowsheet data found.

## 2020-09-30 NOTE — Progress Notes (Signed)
PROGRESS NOTE    Kimberly Gregory  WUJ:[id number] DOB: [birth year] DOA: 09/28/2020 PCP: Lucky Cowboy, MD   Chief Complaint  Patient presents with   Altered Mental Status   Brief Narrative: 85 year-old female with history of dementia, hypertension, recent AKI, chronic kidney disease 3, GERD, history of ischemic colitis vertigo depression hyperlipidemia anemia of chronic disease with recent admission for UTI brought from the facility with altered mental status. In the ED she was febrile 102.1 blood pressure initially stable tachycardic with leukocytosis anemia COVID-19 negative UA showed cloudy and large leukocyte esterase nitrite positive WBC more than 50 chest x-ray no acute finding EKG no acute finding blood and urine cultures sent patient was placed on IV fluids and admitted for UTI/sepsis. She has lost IV access during night.  Subjective: Overnight no fever blood pressure was soft 90s received full blouses. WBC count 17.9- ?? 2.7 wbc likely spurious. Patient is alert, awake able to recognize her son at the bedside appears to be baseline with advanced dementia Son thinks She is about the same.  Assessment & Plan:  Acute metabolic encephalopathy in the setting of dementia Mentation appears to have improved but with advanced dementia.  Continue supportive measures fall precaution delirium precaution, continue treatment of underlying sepsis.  Patient is advanced dementia and lives in memory care as per the son.  At baseline she knows her name and abel to recognize her family and is advanved and now in memory care place, dementia diagnosed 5 yrs.  E coli sepsis most likely due to UTI: Lactic acidosis 4.4 7/20 early am- but resolved on recheck.??  The initial value spuriously high due to shivering/shaking.GNR in 1/2 bottles of the blood culture. E coli with no resistance detected(not ESBL.  Continue cefepime-de-escalate antibiotics based upon culture.  Still has persistent leukocytosis  although hemodynamically stable Recent Labs  Lab 09/28/20 1730 09/29/20 0400 09/29/20 0407 09/29/20 1031 09/30/20 0448  WBC 12.3* 2.7*  --   --  17.9*  LATICACIDVEN 1.6  --  4.4* 1.7  --   PROCALCITON  --  7.41  --   --   --      Recurrent UTIs see #2 GERD continue PPI Hypertension blood pressure soft hold meds.  Anemia having chronic issues, suspect chronic anemia noted some drops) from this morning check anemia panel. Recent Labs  Lab 09/28/20 1730 09/29/20 0400 09/30/20 0448  HGB 8.6* 9.2* 7.8*  HCT 25.7* 27.1* 23.4*    GOC: Confirmed DNR discharge with patient's son.  Prognosis remains to be seen but does not appear bright has had multiple admission.  Palliative care has been consulted.    Diet Order             Diet Heart Room service appropriate? Yes; Fluid consistency: Thin  Diet effective now                   Patient's Body mass index is 27.91 kg/m. DVT prophylaxis: enoxaparin (LOVENOX) injection 40 mg Start: 09/29/20 1000 Code Status:   Code Status: DNR  Family Communication: plan of care discussed with patient's son at bedside. Status is: Inpatient Remains inpatient appropriate because:IV treatments appropriate due to intensity of illness or inability to take PO and Inpatient level of care appropriate due to severity of illness Dispo: The patient is from: SNF              Anticipated d/c is to: SNF  Patient currently is not medically stable to d/c.   Difficult to place patient No  Unresulted Labs (From admission, onward)     Start     Ordered   10/05/20 0500  Creatinine, serum  (enoxaparin (LOVENOX)    CrCl >/= 30 ml/min)  Weekly,   R     Comments: while on enoxaparin therapy    09/29/20 0015   09/30/20 0500  Basic metabolic panel  Daily,   R     Question:  Specimen collection method  Answer:  Lab=Lab collect   09/29/20 0731   09/30/20 0500  CBC  Daily,   R     Question:  Specimen collection method  Answer:  Lab=Lab collect    09/29/20 0731   09/28/20 1654  Urine Culture  (Undifferentiated presentation (screening labs and basic nursing orders))  ONCE - STAT,   STAT       Question:  Indication  Answer:  Sepsis   09/28/20 1654           Medications reviewed:  Scheduled Meds:  acetaminophen  650 mg Oral Once   ARIPiprazole  2 mg Oral Daily   divalproex  250 mg Oral TID with meals   enoxaparin (LOVENOX) injection  40 mg Subcutaneous Q24H   feeding supplement  237 mL Oral Q24H   melatonin  3 mg Oral QHS   multivitamin with minerals  1 tablet Oral Daily   sertraline  25 mg Oral Daily   Continuous Infusions:  ceFEPime (MAXIPIME) IV 2 g (09/29/20 2328)   lactated ringers 125 mL/hr at 09/29/20 2241   Consultants:see note  Procedures:see note Antimicrobials: Anti-infectives (From admission, onward)    Start     Dose/Rate Route Frequency Ordered Stop   09/29/20 1100  ceFEPIme (MAXIPIME) 2 g in sodium chloride 0.9 % 100 mL IVPB        2 g 200 mL/hr over 30 Minutes Intravenous Every 12 hours 09/28/20 2313     09/29/20 0030  ceFEPIme (MAXIPIME) 2 g in sodium chloride 0.9 % 100 mL IVPB  Status:  Discontinued        2 g 200 mL/hr over 30 Minutes Intravenous  Once 09/29/20 0015 09/29/20 0017   09/28/20 2215  ceFEPIme (MAXIPIME) 2 g in sodium chloride 0.9 % 100 mL IVPB        2 g 200 mL/hr over 30 Minutes Intravenous  Once 09/28/20 2213 09/29/20 0016      Culture/Microbiology    Component Value Date/Time   SDES  09/28/2020 1730    BLOOD RIGHT ANTECUBITAL Performed at [facility], [address], [place], [place] [address]    SPECREQUEST  09/28/2020 1730    BOTTLES DRAWN AEROBIC AND ANAEROBIC Blood Culture results may not be optimal due to an inadequate volume of blood received in culture bottles Performed at [facility], [address], [place], [place] [address]    Elpidio Anis NEGATIVE RODS 09/28/2020 1730   REPTSTATUS PENDING 09/28/2020 1730    Other culture-see  note  Objective: Vitals: Today's Vitals   09/29/20 2000 09/29/20 2211 09/30/20 0202 09/30/20 0558  BP: 102/60 (!) 96/49 (!) 106/47 (!) 112/54  Pulse: 80 83 72 77  Resp: 18 18  20   Temp:  98.1 F (36.7 C) 98.2 F (36.8 C) (!) 97.5 F (36.4 C)  TempSrc:  Oral Oral Oral  SpO2: 100% 98% 97% 92%  Weight:      Height:  Intake/Output Summary (Last 24 hours) at 09/30/2020 0700 Last data filed at 09/30/2020 0600 Gross per 24 hour  Intake 1565 ml  Output --  Net 1565 ml    Filed Weights   09/28/20 1659  Weight: 67 kg   Weight change:   Intake/Output from previous day: 07/20 0701 - 07/21 0700 In: 1565 [P.O.:240; I.V.:1125; IV Piggyback:200] Out: -  Intake/Output this shift: Total I/O In: 1565 [P.O.:240; I.V.:1125; IV Piggyback:200] Out: -  Filed Weights   09/28/20 1659  Weight: 67 kg   Examination: General exam: AAOx to self, elderly, frail older than stated age, weak appearing. HEENT:Oral mucosa moist, Ear/Nose WNL grossly, dentition normal. Respiratory system: bilaterally clear without added sounds, no use of accessory muscle Cardiovascular system: S1 & S2 +, No JVD,. Gastrointestinal system: Abdomen soft NT,ND, BS+ Nervous System:Alert, awake, moving extremities and grossly nonfocal Extremities: no edema, distal peripheral pulses palpable.  Skin: No rashes,no icterus. MSK: Normal muscle bulk,tone, power   Data Reviewed: I have personally reviewed following labs and imaging studies CBC: Recent Labs  Lab 09/28/20 1730 09/29/20 0400 09/30/20 0448  WBC 12.3* 2.7* 17.9*  NEUTROABS 10.2*  --   --   HGB 8.6* 9.2* 7.8*  HCT 25.7* 27.1* 23.4*  MCV 77.4* 77.9* 78.3*  PLT 226 184 174    Basic Metabolic Panel: Recent Labs  Lab 09/28/20 1730 09/29/20 0400 09/30/20 0448  NA 141 143 140  K 3.7 3.7 3.6  CL 110 111 107  CO2 24 21* 25  GLUCOSE 108* 85 89  BUN 23 29* 31*  CREATININE 1.04* 1.21* 1.18*  CALCIUM 8.3* 8.3* 8.2*    GFR: Estimated Creatinine  Clearance: 31.6 mL/min (A) (by C-G formula based on SCr of 1.18 mg/dL (H)). Liver Function Tests: Recent Labs  Lab 09/28/20 1730 09/29/20 0400  AST 28 39  ALT 18 20  ALKPHOS 52 71  BILITOT 0.8 1.1  PROT 6.8 7.3  ALBUMIN 2.9* 2.8*    No results for input(s): LIPASE, AMYLASE in the last 168 hours. No results for input(s): AMMONIA in the last 168 hours. Coagulation Profile: Recent Labs  Lab 09/28/20 1730 09/29/20 0400  INR 1.2 1.4*    Cardiac Enzymes: No results for input(s): CKTOTAL, CKMB, CKMBINDEX, TROPONINI in the last 168 hours. BNP (last 3 results) No results for input(s): PROBNP in the last 8760 hours. HbA1C: No results for input(s): HGBA1C in the last 72 hours. CBG: No results for input(s): GLUCAP in the last 168 hours. Lipid Profile: No results for input(s): CHOL, HDL, LDLCALC, TRIG, CHOLHDL, LDLDIRECT in the last 72 hours. Thyroid Function Tests: No results for input(s): TSH, T4TOTAL, FREET4, T3FREE, THYROIDAB in the last 72 hours. Anemia Panel: No results for input(s): VITAMINB12, FOLATE, FERRITIN, TIBC, IRON, RETICCTPCT in the last 72 hours. Sepsis Labs: Recent Labs  Lab 09/28/20 1730 09/29/20 0400 09/29/20 0407 09/29/20 1031  PROCALCITON  --  7.41  --   --   LATICACIDVEN 1.6  --  4.4* 1.7     Recent Results (from the past 240 hour(s))  Blood culture (routine single)     Status: None (Preliminary result)   Collection Time: 09/28/20  5:30 PM   Specimen: BLOOD  Result Value Ref Range Status   Specimen Description   Final    BLOOD RIGHT ANTECUBITAL Performed at [facility], [address], [place], [place] [address]    Special Requests   Final    BOTTLES DRAWN AEROBIC AND ANAEROBIC Blood Culture results may  not be optimal due to an inadequate volume of blood received in culture bottles Performed at [facility], [address] [place], [place] [address]    Culture  Setup Time   Final    GRAM NEGATIVE  RODS AEROBIC BOTTLE ONLY CRITICAL RESULT CALLED TO, READ BACK BY AND VERIFIED WITH: PHARMD T GREEN [id number] AT 1244 BY CM Performed at [facility], [address], [place], [place] [address]    Culture GRAM NEGATIVE RODS  Final   Report Status PENDING  Incomplete  Blood Culture ID Panel (Reflexed)     Status: Abnormal   Collection Time: 09/28/20  5:30 PM  Result Value Ref Range Status   Enterococcus faecalis NOT DETECTED NOT DETECTED Final   Enterococcus Faecium NOT DETECTED NOT DETECTED Final   Listeria monocytogenes NOT DETECTED NOT DETECTED Final   Staphylococcus species NOT DETECTED NOT DETECTED Final   Staphylococcus aureus (BCID) NOT DETECTED NOT DETECTED Final   Staphylococcus epidermidis NOT DETECTED NOT DETECTED Final   Staphylococcus lugdunensis NOT DETECTED NOT DETECTED Final   Streptococcus species NOT DETECTED NOT DETECTED Final   Streptococcus agalactiae NOT DETECTED NOT DETECTED Final   Streptococcus pneumoniae NOT DETECTED NOT DETECTED Final   Streptococcus pyogenes NOT DETECTED NOT DETECTED Final   A.calcoaceticus-baumannii NOT DETECTED NOT DETECTED Final   Bacteroides fragilis NOT DETECTED NOT DETECTED Final   Enterobacterales DETECTED (A) NOT DETECTED Final    Comment: Enterobacterales represent a large order of gram negative bacteria, not a single organism. CRITICAL RESULT CALLED TO, READ BACK BY AND VERIFIED WITH: PHARMD T GREEN [id number] AR 1242 BY CM    Enterobacter cloacae complex NOT DETECTED NOT DETECTED Final   Escherichia coli DETECTED (A) NOT DETECTED Final    Comment: CRITICAL RESULT CALLED TO, READ BACK BY AND VERIFIED WITH: PHARMD T GREEN [id number] AT 1242 BY CM    Klebsiella aerogenes NOT DETECTED NOT DETECTED Final   Klebsiella oxytoca NOT DETECTED NOT DETECTED Final   Klebsiella pneumoniae NOT DETECTED NOT DETECTED Final   Proteus species NOT DETECTED NOT DETECTED Final   Salmonella species NOT DETECTED NOT DETECTED Final   Serratia marcescens  NOT DETECTED NOT DETECTED Final   Haemophilus influenzae NOT DETECTED NOT DETECTED Final   Neisseria meningitidis NOT DETECTED NOT DETECTED Final   Pseudomonas aeruginosa NOT DETECTED NOT DETECTED Final   Stenotrophomonas maltophilia NOT DETECTED NOT DETECTED Final   Candida albicans NOT DETECTED NOT DETECTED Final   Candida auris NOT DETECTED NOT DETECTED Final   Candida glabrata NOT DETECTED NOT DETECTED Final   Candida krusei NOT DETECTED NOT DETECTED Final   Candida parapsilosis NOT DETECTED NOT DETECTED Final   Candida tropicalis NOT DETECTED NOT DETECTED Final   Cryptococcus neoformans/gattii NOT DETECTED NOT DETECTED Final   CTX-M ESBL NOT DETECTED NOT DETECTED Final   Carbapenem resistance IMP NOT DETECTED NOT DETECTED Final   Carbapenem resistance KPC NOT DETECTED NOT DETECTED Final   Carbapenem resistance NDM NOT DETECTED NOT DETECTED Final   Carbapenem resist OXA 48 LIKE NOT DETECTED NOT DETECTED Final   Carbapenem resistance VIM NOT DETECTED NOT DETECTED Final    Comment: Performed at [facility], [address] [place], [place] [address]  Resp Panel by RT-PCR (Flu A&B, Covid) Nasopharyngeal Swab     Status: None   Collection Time: 09/28/20  5:38 PM   Specimen: Nasopharyngeal Swab; Nasopharyngeal(NP) swabs in vial transport medium  Result Value Ref Range Status   SARS Coronavirus  2 by RT PCR NEGATIVE NEGATIVE Final    Comment: (NOTE) SARS-CoV-2 target nucleic acids are NOT DETECTED.  The SARS-CoV-2 RNA is generally detectable in upper respiratory specimens during the acute phase of infection. The lowest concentration of SARS-CoV-2 viral copies this assay can detect is 138 copies/mL. A negative result does not preclude SARS-Cov-2 infection and should not be used as the sole basis for treatment or other patient management decisions. A negative result may occur with  improper specimen collection/handling, submission of specimen other than nasopharyngeal swab,  presence of viral mutation(s) within the areas targeted by this assay, and inadequate number of viral copies(<138 copies/mL). A negative result must be combined with clinical observations, patient history, and epidemiological information. The expected result is Negative.  Fact Sheet for Patients:  BloggerCourse.comhttps://www.fda.gov/media/152166/download  Fact Sheet for Healthcare Providers:  SeriousBroker.ithttps://www.fda.gov/media/152162/download  This test is no t yet approved or cleared by the [place] FDA and  has been authorized for detection and/or diagnosis of SARS-CoV-2 by FDA under an Emergency Use Authorization (EUA). This EUA will remain  in effect (meaning this test can be used) for the duration of the COVID-19 declaration under Section 564(b)(1) of the Act, 21 U.S.C.section 360bbb-3(b)(1), unless the authorization is terminated  or revoked sooner.       Influenza A by PCR NEGATIVE NEGATIVE Final   Influenza B by PCR NEGATIVE NEGATIVE Final    Comment: (NOTE) The Xpert Xpress SARS-CoV-2/FLU/RSV plus assay is intended as an aid in the diagnosis of influenza from Nasopharyngeal swab specimens and should not be used as a sole basis for treatment. Nasal washings and aspirates are unacceptable for Xpert Xpress SARS-CoV-2/FLU/RSV testing.  Fact Sheet for Patients: BloggerCourse.comhttps://www.fda.gov/media/152166/download  Fact Sheet for Healthcare Providers: SeriousBroker.ithttps://www.fda.gov/media/152162/download  This test is not yet approved or cleared by the [place] FDA and has been authorized for detection and/or diagnosis of SARS-CoV-2 by FDA under an Emergency Use Authorization (EUA). This EUA will remain in effect (meaning this test can be used) for the duration of the COVID-19 declaration under Section 564(b)(1) of the Act, 21 U.S.C. section 360bbb-3(b)(1), unless the authorization is terminated or revoked.  Performed at [facility], [address] [place], [place] [id number]        [facility]  Result Date: 09/28/2020 CLINICAL DATA:  Questionable sepsis - evaluate for abnormality Combative patient with altered mental status. EXAM: PORTABLE CHEST 1 VIEW COMPARISON:  09/08/2020 FINDINGS: Patient is rotated. Stable heart size and mediastinal contours allowing for rotation. There is mild bibasilar atelectasis. No confluent consolidation. No pulmonary edema, large pleural effusion or pneumothorax. No acute osseous abnormalities are seen. IMPRESSION: Mild bibasilar atelectasis. Electronically Signed   By: Narda RutherfordMelanie  Sanford M.D.   On: 09/28/2020 21:48     LOS: 2 days   Lanae Boastamesh Raysha Tilmon, MD [facility]  09/30/2020, 7:00 AM

## 2020-10-01 DIAGNOSIS — Z7189 Other specified counseling: Secondary | ICD-10-CM | POA: Diagnosis not present

## 2020-10-01 DIAGNOSIS — Z515 Encounter for palliative care: Secondary | ICD-10-CM | POA: Diagnosis not present

## 2020-10-01 DIAGNOSIS — R531 Weakness: Secondary | ICD-10-CM | POA: Diagnosis not present

## 2020-10-01 DIAGNOSIS — A419 Sepsis, unspecified organism: Secondary | ICD-10-CM | POA: Diagnosis not present

## 2020-10-01 DIAGNOSIS — N390 Urinary tract infection, site not specified: Secondary | ICD-10-CM | POA: Diagnosis not present

## 2020-10-01 LAB — CULTURE, BLOOD (SINGLE)

## 2020-10-01 LAB — CBC
HCT: 23 % — ABNORMAL LOW (ref 36.0–46.0)
Hemoglobin: 7.7 g/dL — ABNORMAL LOW (ref 12.0–15.0)
MCH: 26 pg (ref 26.0–34.0)
MCHC: 33.5 g/dL (ref 30.0–36.0)
MCV: 77.7 fL — ABNORMAL LOW (ref 80.0–100.0)
Platelets: 172 10*3/uL (ref 150–400)
RBC: 2.96 MIL/uL — ABNORMAL LOW (ref 3.87–5.11)
RDW: 19.9 % — ABNORMAL HIGH (ref 11.5–15.5)
WBC: 11.8 10*3/uL — ABNORMAL HIGH (ref 4.0–10.5)
nRBC: 0 % (ref 0.0–0.2)

## 2020-10-01 LAB — BASIC METABOLIC PANEL
Anion gap: 6 (ref 5–15)
BUN: 26 mg/dL — ABNORMAL HIGH (ref 8–23)
CO2: 25 mmol/L (ref 22–32)
Calcium: 7.9 mg/dL — ABNORMAL LOW (ref 8.9–10.3)
Chloride: 108 mmol/L (ref 98–111)
Creatinine, Ser: 0.94 mg/dL (ref 0.44–1.00)
GFR, Estimated: >60 mL/min (ref >60)
Glucose, Bld: 95 mg/dL (ref 70–99)
Potassium: 3.4 mmol/L — ABNORMAL LOW (ref 3.5–5.1)
Sodium: 139 mmol/L (ref 135–145)

## 2020-10-01 MED ORDER — CYANOCOBALAMIN 250 MCG PO TABS
500.0000 ug | ORAL_TABLET | Freq: Every day | ORAL | Status: DC
  Administered 2020-10-01 – 2020-10-03 (×2): 500 ug via ORAL
  Filled 2020-10-01 (×4): qty 2

## 2020-10-01 MED ORDER — AMOXICILLIN 250 MG PO CAPS
500.0000 mg | ORAL_CAPSULE | Freq: Three times a day (TID) | ORAL | Status: DC
  Administered 2020-10-01 – 2020-10-03 (×7): 500 mg via ORAL
  Filled 2020-10-01 (×7): qty 2

## 2020-10-01 MED ORDER — LACTATED RINGERS IV SOLN: INTRAVENOUS | Status: AC

## 2020-10-01 MED ORDER — POTASSIUM CHLORIDE CRYS ER 20 MEQ PO TBCR
20.0000 meq | EXTENDED_RELEASE_TABLET | Freq: Once | ORAL | Status: AC
  Administered 2020-10-01: 20 meq via ORAL
  Filled 2020-10-01: qty 1

## 2020-10-01 NOTE — Consult Note (Signed)
Consultation Note Date: 10/01/2020   Patient Name: Kimberly Gregory  DOB: 05/27/1935  MRN: [id number]  Age / Sex: 85 y.o., female  PCP: Lucky Cowboy, MD Referring Physician: Lanae Boast, MD  Reason for Consultation: Establishing goals of care  HPI/Patient Profile: 85 y.o. female  admitted on 09/28/2020   Clinical Assessment and Goals of Care: 85 year old with dementia who lives at [facility], has history of hypertension recent acute kidney injury has baseline stage III chronic kidney disease and gastroesophageal reflux disease history of ischemic colitis vertigo depression dyslipidemia anemia of chronic disease.  Patient has history of recurrent urinary tract infections and also had recent admission for urinary tract infection, brought in with a fever and altered mental status tachycardia leukocytosis and was admitted with a working diagnosis of urinary tract infection/sepsis to hospital medicine service.  Also admitted with acute metabolic encephalopathy in the setting of advanced dementia likely secondary to infection.  Patient was given IV antibiotics.  Palliative consultation for ongoing goals of care discussions has been requested.  Patient is awake alert resting in bed.  She has baseline dementia.  She is able to state her name.  She is in no distress.  Not oriented to place or time.  Palliative medicine is specialized medical care for people living with serious illness. It focuses on providing relief from the symptoms and stress of a serious illness. The goal is to improve quality of life for both the patient and the family. Goals of care: Broad aims of medical therapy in relation to the patient's values and preferences. Our aim is to provide medical care aimed at enabling patients to achieve the goals that matter most to them, given the circumstances of their particular medical situation and their  constraints.   Call placed and discussed with son.  We discussed about having palliative presence at her facility, she has had palliative care follow her out at the facility recommended continuation of that.  Discussed about recurrent urinary tract infections and dementia decline trajectory.  Son is aware.  See below.  HCPOA  Son.   SUMMARY OF RECOMMENDATIONS   Agree with DO NOT RESUSCITATE Continue current mode of care Recommend continuation of palliative services at patient's facility. Thank you for the consult.  Code Status/Advance Care Planning: DNR   Symptom Management:    Palliative Prophylaxis:  Delirium Protocol  Psycho-social/Spiritual:  Desire for further Chaplaincy support:yes Additional Recommendations: Caregiving  Support/Resources  Prognosis:  Unable to determine  Discharge Planning:  recommend palliative follow up at her facility.        Primary Diagnoses: Present on Admission:  Sepsis secondary to UTI (HCC)  Benign essential HTN  Dementia with behavioral disturbance (HCC)  Gastroesophageal reflux disease without esophagitis  Hypotension  Urinary tract infection with hematuria   I have reviewed the medical record, interviewed the patient and family, and examined the patient. The following aspects are pertinent.  Past Medical History:  Diagnosis Date   Acute encephalopathy 06/13/2016   AKI (acute kidney injury) (HCC) 06/13/2016  Anemia 09/09/2020   Arthritis    Chronic kidney disease    Dementia (HCC)    Depression 09/11/2018   Gastroesophageal reflux disease without esophagitis 09/11/2018   Headache    Hypercholesterolemia    Hypertension    Ischemic colitis (HCC) 10/24/2016   Laxative abuse 10/21/2016   SIRS (systemic inflammatory response syndrome) (HCC) 10/21/2016   Vertigo 10/14/2014   Social History   Socioeconomic History   Marital status: Married    Spouse name: Not on file   Number of children: 3   Years of education: 12   Highest  education level: Not on file  Occupational History   Occupation: retired    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, Scientist, research (physical sciences) , retired  Tobacco Use   Smoking status: Former    Packs/day: 0.50    Years: 55.00    Pack years: 27.50    Types: Cigarettes    Quit date: 07/12/2019    Years since quitting: 1.2   Smokeless tobacco: Never   Tobacco comments:    Stopped smoking May 2021 per family  Vaping Use   Vaping Use: Never used  Substance and Sexual Activity   Alcohol use: No   Drug use: No   Sexual activity: Never    Birth control/protection: Abstinence  Other Topics Concern   Not on file  Social History Narrative   Lives at home with husband - he is s/p stroke   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 No pertinent family history. Scheduled Meds:  acetaminophen  650 mg Oral Once   ARIPiprazole  2 mg Oral Daily   divalproex  250 mg Oral TID with meals   enoxaparin (LOVENOX) injection  40 mg Subcutaneous Q24H   feeding supplement  1 Container Oral Q24H   feeding supplement  237 mL Oral Q24H   melatonin  3 mg Oral QHS   multivitamin with minerals  1 tablet Oral Daily   potassium chloride  20 mEq Oral Once   sertraline  25 mg Oral Daily   vitamin B-12  500 mcg Oral Daily   Continuous Infusions:  cefTRIAXone (ROCEPHIN)  IV 2 g (09/30/20 2231)   lactated ringers 50 mL/hr at 10/01/20 0818   PRN Meds:.acetaminophen **OR** acetaminophen, levalbuterol, ondansetron **OR** ondansetron (ZOFRAN) IV Medications Prior to Admission:  Prior to Admission medications   Medication Sig Start Date End Date Taking? Authorizing Provider  ARIPiprazole (ABILIFY) 2 MG tablet Take 2 mg by mouth  daily. 07/31/20  Yes [provider]  divalproex (DEPAKOTE SPRINKLE) 125 MG capsule Take 250 mg by mouth with breakfast, with lunch, and with evening meal. 07/31/20  Yes [provider]  feeding supplement (ENSURE ENLIVE / ENSURE PLUS) LIQD Take 237 mLs by mouth daily. 09/12/20  Yes Swayze, Ava, DO  melatonin 3 MG TABS tablet Take 3 mg by mouth at bedtime.   Yes [provider]  Multiple Vitamin (MULTIVITAMIN WITH MINERALS) TABS tablet Take 1 tablet by mouth daily. 09/12/20  Yes Swayze, Ava, DO  sertraline (ZOLOFT) 25 MG tablet Take 25 mg by mouth daily. 07/31/20  Yes [provider]  Cholecalciferol (VITAMIN D3) 1.25 MG (  50000 UT) CAPS Take 1 capsule 3  /week for Vitamin D Deficiency Patient not taking: Reported on 09/28/2020 01/25/19   Lucky Cowboy, MD  ondansetron (ZOFRAN) 4 MG tablet Take 1 tablet (4 mg total) by mouth every 8 (eight) hours as needed for nausea or vomiting. Patient not taking: Reported on 09/28/2020 09/12/20   Swayze, Ava, DO   No Known Allergies Review of Systems +weakness  Physical Exam General exam: AAOx self, pleasant, interactive, has baseline dementia.  HEENT:Oral mucosa moist,  Respiratory system: bilaterally diminished, no use of accessory muscle Cardiovascular system: S1 & S2 +, No JVD,. Gastrointestinal system: Abdomen soft, NT,ND, BS+ Nervous System:Alert, awake, moving extremities and grossly nonfocal, mits on. Extremities: no edema, distal peripheral pulses palpable. Skin: No rashes,no icterus. MSK: Normal muscle bulk,tone, power  Vital Signs: BP (!) 131/56 (BP Location: Left Arm)   Pulse 78   Temp 98.1 F (36.7 C) (Oral)   Resp 20   Ht 5\' 1"  (1.549 m)   Wt 67 kg   SpO2 96%   BMI 27.91 kg/m  Pain Scale: Faces   Pain Score: 0-No pain   SpO2: SpO2: 96 % O2 Device:SpO2: 96 % O2 Flow Rate: .   IO: Intake/output summary:  Intake/Output Summary (Last 24 hours) at 10/01/2020 1137 Last data filed at 10/01/2020  0500 Gross per 24 hour  Intake 3714.94 ml  Output 1700 ml  Net 2014.94 ml    LBM: Last BM Date:  (UTA) Baseline Weight: Weight: 67 kg Most recent weight: Weight: 67 kg     Palliative Assessment/Data:   PPS 40%  Time In:  10.30 Time Out:  11.30 Time Total:  60 min.  Greater than 50%  of this time was spent counseling and coordinating care related to the above assessment and plan.  Signed by: 10/03/2020, MD   Please contact Palliative Medicine Team phone at [phone] for questions and concerns.  For individual provider: See [phone]

## 2020-10-01 NOTE — Progress Notes (Signed)
PROGRESS NOTE    Kimberly Gregory  ZOX:[id number] DOB: 1935/07/01 DOA: 09/28/2020 PCP: Lucky Cowboy, MD   Chief Complaint  Patient presents with   Altered Mental Status   Brief Narrative: 85 year-old female with history of dementia, hypertension, recent AKI, chronic kidney disease 3, GERD, history of ischemic colitis vertigo depression hyperlipidemia anemia of chronic disease with recent admission for UTI brought from the facility with altered mental status. In the ED she was febrile 102.1 blood pressure initially stable tachycardic with leukocytosis anemia COVID-19 negative UA showed cloudy and large leukocyte esterase nitrite positive WBC more than 50 chest x-ray no acute finding EKG no acute finding blood and urine cultures sent patient was placed on IV fluids and admitted for UTI/sepsis.  Subjective: Seen this morning patient is much more pleasant oriented with baseline dementia.  Is interactive today. Overnight no acute events, WbC count and creatinine improving  Assessment & Plan:  Acute metabolic encephalopathy in the setting of dementia Mentation appears to have improved but with advanced dementia.  Continue supportive measures fall precaution delirium precaution, continue underlying UTI treatment.At baseline she knows her name and abel to recognize her family and is advanved and now in memory care place, dementia diagnosed 5 yrs.  E coli sepsis most likely due to UTI: Lactic acidosis 4.4 7/20 early am- but resolved on recheck.?? the initial value spuriously high due to shivering/shaking.E. coli in 1/2 bottles of the blood culture.  Continue current IV ceftriaxone, patient is responding well to treatment.   Recent Labs  Lab 09/28/20 1730 09/29/20 0400 09/29/20 0407 09/29/20 1031 09/30/20 0448 10/01/20 0417  WBC 12.3* 2.7*  --   --  17.9* 11.8*  LATICACIDVEN 1.6  --  4.4* 1.7  --   --   PROCALCITON  --  7.41  --   --   --   --    Recurrent UTIs see #2 GERD on  PPI Hypertension BP stable currently soft on admission.  Wean off ivf Mild hypokalemia replacement ordered Anemia having chronic issues, suspect chronic anemia noted some drops) from this morning  Anemia panel shows iron less than 5, B12 381, at which he supplementation iron supplementation. Recent Labs  Lab 09/28/20 1730 09/29/20 0400 09/30/20 0448 10/01/20 0417  HGB 8.6* 9.2* 7.8* 7.7*  HCT 25.7* 27.1* 23.4* 23.0*   GOC: Confirmed DNR status with patient's son.  Prognosis remains to be seen but does not appear bright has had multiple admission.  Palliative care has been consulted.    Diet Order             Diet Heart Room service appropriate? Yes; Fluid consistency: Thin  Diet effective now                   Patient's Body mass index is 27.91 kg/m. DVT prophylaxis: enoxaparin (LOVENOX) injection 40 mg Start: 09/29/20 1000 Code Status:   Code Status: DNR  Family Communication: plan of care discussed with patient's son at bedside. Status is: Inpatient Remains inpatient appropriate because:IV treatments appropriate due to intensity of illness or inability to take PO and Inpatient level of care appropriate due to severity of illness Dispo: The patient is from: SNF              Anticipated d/c is to: SNF likely tomorrow if remains stable. COVID swab if needed later todaty.              Patient currently is not medically stable to d/c.  Difficult to place patient No  Unresulted Labs (From admission, onward)     Start     Ordered   10/05/20 0500  Creatinine, serum  (enoxaparin (LOVENOX)    CrCl >/= 30 ml/min)  Weekly,   R     Comments: while on enoxaparin therapy    09/29/20 0015   09/30/20 0500  Basic metabolic panel  Daily,   R     Question:  Specimen collection method  Answer:  Lab=Lab collect   09/29/20 0731   09/30/20 0500  CBC  Daily,   R     Question:  Specimen collection method  Answer:  Lab=Lab collect   09/29/20 0731           Medications reviewed:   Scheduled Meds:  acetaminophen  650 mg Oral Once   ARIPiprazole  2 mg Oral Daily   divalproex  250 mg Oral TID with meals   enoxaparin (LOVENOX) injection  40 mg Subcutaneous Q24H   feeding supplement  1 Container Oral Q24H   feeding supplement  237 mL Oral Q24H   melatonin  3 mg Oral QHS   multivitamin with minerals  1 tablet Oral Daily   sertraline  25 mg Oral Daily   Continuous Infusions:  cefTRIAXone (ROCEPHIN)  IV 2 g (09/30/20 2231)   lactated ringers 125 mL/hr at 10/01/20 0129   Consultants:see note  Procedures:see note Antimicrobials: Anti-infectives (From admission, onward)    Start     Dose/Rate Route Frequency Ordered Stop   09/30/20 2200  cefTRIAXone (ROCEPHIN) 2 g in sodium chloride 0.9 % 100 mL IVPB        2 g 200 mL/hr over 30 Minutes Intravenous Every 24 hours 09/30/20 1135     09/29/20 1100  ceFEPIme (MAXIPIME) 2 g in sodium chloride 0.9 % 100 mL IVPB  Status:  Discontinued        2 g 200 mL/hr over 30 Minutes Intravenous Every 12 hours 09/28/20 2313 09/30/20 1135   09/29/20 0030  ceFEPIme (MAXIPIME) 2 g in sodium chloride 0.9 % 100 mL IVPB  Status:  Discontinued        2 g 200 mL/hr over 30 Minutes Intravenous  Once 09/29/20 0015 09/29/20 0017   09/28/20 2215  ceFEPIme (MAXIPIME) 2 g in sodium chloride 0.9 % 100 mL IVPB        2 g 200 mL/hr over 30 Minutes Intravenous  Once 09/28/20 2213 09/29/20 0016      Culture/Microbiology    Component Value Date/Time   SDES  09/28/2020 2100    IN/OUT CATH URINE Performed at [facility], [address], [place], [place] [address]    SPECREQUEST  09/28/2020 2100    NONE Performed at [facility], [address] [place], [place] [address]    CULT (A) 09/28/2020 2100    >=100,000 COLONIES/mL ESCHERICHIA COLI SUSCEPTIBILITIES TO FOLLOW CULTURE REINCUBATED FOR BETTER GROWTH Performed at [facility], [address] [place], [place] [address]    REPTSTATUS PENDING  09/28/2020 2100    Other culture-see note  Objective: Vitals: Today's Vitals   09/30/20 1353 09/30/20 2114 09/30/20 2220 10/01/20 0445  BP: 130/64 137/62  (!) 131/56  Pulse: 78 84  78  Resp: 17 18  20   Temp: (!) 97.4 F (36.3 C) 98 F (36.7 C)  98.1 F (36.7 C)  TempSrc:  Oral  Oral  SpO2: 99% 99%  96%  Weight:  Height:      PainSc:   0-No pain     Intake/Output Summary (Last 24 hours) at 10/01/2020 0739 Last data filed at 10/01/2020 0500 Gross per 24 hour  Intake 3954.94 ml  Output 1700 ml  Net 2254.94 ml    Filed Weights   09/28/20 1659  Weight: 67 kg   Weight change:   Intake/Output from previous day: 07/21 0701 - 07/22 0700 In: 3954.9 [P.O.:1210; I.V.:2644.9; IV Piggyback:100] Out: 1700 [Urine:1700] Intake/Output this shift: No intake/output data recorded. Filed Weights   09/28/20 1659  Weight: 67 kg   Examination: General exam: AAOx self, pleasant, interactive HEENT:Oral mucosa moist, Ear/Nose WNL grossly, dentition normal. Respiratory system: bilaterally diminished, no use of accessory muscle Cardiovascular system: S1 & S2 +, No JVD,. Gastrointestinal system: Abdomen soft, NT,ND, BS+ Nervous System:Alert, awake, moving extremities and grossly nonfocal, mits on. Extremities: no edema, distal peripheral pulses palpable.  Skin: No rashes,no icterus. MSK: Normal muscle bulk,tone, power   Data Reviewed: I have personally reviewed following labs and imaging studies CBC: Recent Labs  Lab 09/28/20 1730 09/29/20 0400 09/30/20 0448 10/01/20 0417  WBC 12.3* 2.7* 17.9* 11.8*  NEUTROABS 10.2*  --   --   --   HGB 8.6* 9.2* 7.8* 7.7*  HCT 25.7* 27.1* 23.4* 23.0*  MCV 77.4* 77.9* 78.3* 77.7*  PLT 226 184 174 172    Basic Metabolic Panel: Recent Labs  Lab 09/28/20 1730 09/29/20 0400 09/30/20 0448 10/01/20 0417  NA 141 143 140 139  K 3.7 3.7 3.6 3.4*  CL 110 111 107 108  CO2 24 21* 25 25  GLUCOSE 108* 85 89 95  BUN 23 29* 31* 26*  CREATININE  1.04* 1.21* 1.18* 0.94  CALCIUM 8.3* 8.3* 8.2* 7.9*    GFR: Estimated Creatinine Clearance: 39.7 mL/min (by C-G formula based on SCr of 0.94 mg/dL). Liver Function Tests: Recent Labs  Lab 09/28/20 1730 09/29/20 0400  AST 28 39  ALT 18 20  ALKPHOS 52 71  BILITOT 0.8 1.1  PROT 6.8 7.3  ALBUMIN 2.9* 2.8*    No results for input(s): LIPASE, AMYLASE in the last 168 hours. No results for input(s): AMMONIA in the last 168 hours. Coagulation Profile: Recent Labs  Lab 09/28/20 1730 09/29/20 0400  INR 1.2 1.4*    Cardiac Enzymes: No results for input(s): CKTOTAL, CKMB, CKMBINDEX, TROPONINI in the last 168 hours. BNP (last 3 results) No results for input(s): PROBNP in the last 8760 hours. HbA1C: No results for input(s): HGBA1C in the last 72 hours. CBG: No results for input(s): GLUCAP in the last 168 hours. Lipid Profile: No results for input(s): CHOL, HDL, LDLCALC, TRIG, CHOLHDL, LDLDIRECT in the last 72 hours. Thyroid Function Tests: No results for input(s): TSH, T4TOTAL, FREET4, T3FREE, THYROIDAB in the last 72 hours. Anemia Panel: Recent Labs    09/30/20 1158  VITAMINB12 381  FOLATE 12.4  FERRITIN 37  TIBC NOT CALCULATED  IRON <5*  RETICCTPCT 1.3   Sepsis Labs: Recent Labs  Lab 09/28/20 1730 09/29/20 0400 09/29/20 0407 09/29/20 1031  PROCALCITON  --  7.41  --   --   LATICACIDVEN 1.6  --  4.4* 1.7     Recent Results (from the past 240 hour(s))  Blood culture (routine single)     Status: Abnormal   Collection Time: 09/28/20  5:30 PM   Specimen: BLOOD  Result Value Ref Range Status   Specimen Description   Final    BLOOD RIGHT ANTECUBITAL Performed at [facility], [address], [place], [place] [address]    Special Requests   Final    BOTTLES DRAWN AEROBIC AND ANAEROBIC Blood Culture results may not be optimal due to an inadequate volume of blood received in culture bottles Performed at [facility], [address] [place], [place] [address]    Culture  Setup Time   Final    GRAM NEGATIVE RODS AEROBIC BOTTLE ONLY CRITICAL RESULT CALLED TO, READ BACK BY AND VERIFIED WITH: PHARMD T GREEN [id number] AT 1244 BY CM Performed at [facility], [address], [place], [place] [address]    Culture ESCHERICHIA COLI (A)  Final   Report Status 10/01/2020 FINAL  Final   Organism ID, Bacteria ESCHERICHIA COLI  Final      Susceptibility   Escherichia coli - MIC*    AMPICILLIN 4 SENSITIVE Sensitive     CEFAZOLIN <=4 SENSITIVE Sensitive     CEFEPIME <=0.12 SENSITIVE Sensitive     CEFTAZIDIME <=1 SENSITIVE Sensitive     CEFTRIAXONE <=0.25 SENSITIVE Sensitive     CIPROFLOXACIN <=0.25 SENSITIVE Sensitive     GENTAMICIN <=1 SENSITIVE Sensitive     IMIPENEM <=0.25 SENSITIVE Sensitive     TRIMETH/SULFA <=20 SENSITIVE Sensitive     AMPICILLIN/SULBACTAM <=2 SENSITIVE Sensitive     PIP/TAZO <=4 SENSITIVE Sensitive     * ESCHERICHIA COLI  Blood Culture ID Panel (Reflexed)     Status: Abnormal   Collection Time: 09/28/20  5:30 PM  Result Value Ref Range Status   Enterococcus faecalis NOT DETECTED NOT DETECTED Final   Enterococcus Faecium NOT DETECTED NOT DETECTED Final   Listeria monocytogenes NOT DETECTED NOT DETECTED Final   Staphylococcus species NOT DETECTED NOT DETECTED Final   Staphylococcus aureus (BCID) NOT DETECTED NOT DETECTED Final   Staphylococcus epidermidis NOT DETECTED NOT DETECTED Final   Staphylococcus lugdunensis NOT DETECTED NOT DETECTED Final   Streptococcus species NOT DETECTED NOT DETECTED Final   Streptococcus agalactiae NOT DETECTED NOT DETECTED Final   Streptococcus pneumoniae NOT DETECTED NOT DETECTED Final   Streptococcus pyogenes NOT DETECTED NOT DETECTED Final   A.calcoaceticus-baumannii NOT DETECTED NOT DETECTED Final   Bacteroides fragilis NOT DETECTED NOT DETECTED Final   Enterobacterales DETECTED (A) NOT DETECTED Final    Comment: Enterobacterales represent a large  order of gram negative bacteria, not a single organism. CRITICAL RESULT CALLED TO, READ BACK BY AND VERIFIED WITH: PHARMD T GREEN [id number] AR 1242 BY CM    Enterobacter cloacae complex NOT DETECTED NOT DETECTED Final   Escherichia coli DETECTED (A) NOT DETECTED Final    Comment: CRITICAL RESULT CALLED TO, READ BACK BY AND VERIFIED WITH: PHARMD T GREEN [id number] AT 1242 BY CM    Klebsiella aerogenes NOT DETECTED NOT DETECTED Final   Klebsiella oxytoca NOT DETECTED NOT DETECTED Final   Klebsiella pneumoniae NOT DETECTED NOT DETECTED Final   Proteus species NOT DETECTED NOT DETECTED Final   Salmonella species NOT DETECTED NOT DETECTED Final   Serratia marcescens NOT DETECTED NOT DETECTED Final   Haemophilus influenzae NOT DETECTED NOT DETECTED Final   Neisseria meningitidis NOT DETECTED NOT DETECTED Final   Pseudomonas aeruginosa NOT DETECTED NOT DETECTED Final   Stenotrophomonas maltophilia NOT DETECTED NOT DETECTED Final   Candida albicans NOT DETECTED NOT DETECTED Final   Candida auris NOT DETECTED NOT DETECTED Final   Candida glabrata NOT DETECTED NOT DETECTED Final   Candida krusei NOT DETECTED NOT DETECTED Final   Candida parapsilosis  NOT DETECTED NOT DETECTED Final   Candida tropicalis NOT DETECTED NOT DETECTED Final   Cryptococcus neoformans/gattii NOT DETECTED NOT DETECTED Final   CTX-M ESBL NOT DETECTED NOT DETECTED Final   Carbapenem resistance IMP NOT DETECTED NOT DETECTED Final   Carbapenem resistance KPC NOT DETECTED NOT DETECTED Final   Carbapenem resistance NDM NOT DETECTED NOT DETECTED Final   Carbapenem resist OXA 48 LIKE NOT DETECTED NOT DETECTED Final   Carbapenem resistance VIM NOT DETECTED NOT DETECTED Final    Comment: Performed at [facility], [address], [place], [place] [address]  Resp Panel by RT-PCR (Flu A&B, Covid) Nasopharyngeal Swab     Status: None   Collection Time: 09/28/20  5:38 PM   Specimen: Nasopharyngeal Swab; Nasopharyngeal(NP) swabs in  vial transport medium  Result Value Ref Range Status   SARS Coronavirus 2 by RT PCR NEGATIVE NEGATIVE Final    Comment: (NOTE) SARS-CoV-2 target nucleic acids are NOT DETECTED.  The SARS-CoV-2 RNA is generally detectable in upper respiratory specimens during the acute phase of infection. The lowest concentration of SARS-CoV-2 viral copies this assay can detect is 138 copies/mL. A negative result does not preclude SARS-Cov-2 infection and should not be used as the sole basis for treatment or other patient management decisions. A negative result may occur with  improper specimen collection/handling, submission of specimen other than nasopharyngeal swab, presence of viral mutation(s) within the areas targeted by this assay, and inadequate number of viral copies(<138 copies/mL). A negative result must be combined with clinical observations, patient history, and epidemiological information. The expected result is Negative.  Fact Sheet for Patients:  BloggerCourse.com  Fact Sheet for Healthcare Providers:  SeriousBroker.it  This test is no t yet approved or cleared by the [place] FDA and  has been authorized for detection and/or diagnosis of SARS-CoV-2 by FDA under an Emergency Use Authorization (EUA). This EUA will remain  in effect (meaning this test can be used) for the duration of the COVID-19 declaration under Section 564(b)(1) of the Act, 21 U.S.C.section 360bbb-3(b)(1), unless the authorization is terminated  or revoked sooner.       Influenza A by PCR NEGATIVE NEGATIVE Final   Influenza B by PCR NEGATIVE NEGATIVE Final    Comment: (NOTE) The Xpert Xpress SARS-CoV-2/FLU/RSV plus assay is intended as an aid in the diagnosis of influenza from Nasopharyngeal swab specimens and should not be used as a sole basis for treatment. Nasal washings and aspirates are unacceptable for Xpert Xpress SARS-CoV-2/FLU/RSV testing.  Fact  Sheet for Patients: BloggerCourse.com  Fact Sheet for Healthcare Providers: SeriousBroker.it  This test is not yet approved or cleared by the [place] FDA and has been authorized for detection and/or diagnosis of SARS-CoV-2 by FDA under an Emergency Use Authorization (EUA). This EUA will remain in effect (meaning this test can be used) for the duration of the COVID-19 declaration under Section 564(b)(1) of the Act, 21 U.S.C. section 360bbb-3(b)(1), unless the authorization is terminated or revoked.  Performed at [facility], [address], [place], [place] [address]   Urine Culture     Status: Abnormal (Preliminary result)   Collection Time: 09/28/20  9:00 PM   Specimen: In/Out Cath Urine  Result Value Ref Range Status   Specimen Description   Final    IN/OUT CATH URINE Performed at [facility], [address], [place], [place] [address]    Special Requests   Final    NONE Performed at [facility], [address] [place], [place] [id number]    Culture (A)  Final    >=100,000 COLONIES/mL ESCHERICHIA COLI SUSCEPTIBILITIES TO FOLLOW CULTURE REINCUBATED FOR BETTER GROWTH Performed at [facility], [address] [place], [place] [address]    Report Status PENDING  Incomplete      Radiology Studies: No results found.   LOS: 3 days   Lanae Boastamesh Floy Angert, MD [facility]  10/01/2020, 7:39 AM

## 2020-10-01 NOTE — Progress Notes (Signed)
[facility]   Reason for visit: Follow up on E coli bacteremia  Interval History: sensitivities noted and pansensitive; WBC 11.8, remains afebrile > 48 hours.    Physical Exam: Constitutional:  Vitals:   10/01/20 0445 10/01/20 1512  BP: (!) 131/56 127/71  Pulse: 78 79  Resp: 20 20  Temp: 98.1 F (36.7 C) 97.8 F (36.6 C)  SpO2: 96% 96%   patient appears in NAD Respiratory: Normal respiratory effort; CTA B Cardiovascular: RRR MS: hand with mittens in place  Review of Systems: Unable to be assessed due to patient factors  Lab Results  Component Value Date   WBC 11.8 (H) 10/01/2020   HGB 7.7 (L) 10/01/2020   HCT 23.0 (L) 10/01/2020   MCV 77.7 (L) 10/01/2020   PLT 172 10/01/2020    Lab Results  Component Value Date   CREATININE 0.94 10/01/2020   BUN 26 (H) 10/01/2020   NA 139 10/01/2020   K 3.4 (L) 10/01/2020   CL 108 10/01/2020   CO2 25 10/01/2020    Lab Results  Component Value Date   ALT 20 09/29/2020   AST 39 09/29/2020   ALKPHOS 71 09/29/2020     Microbiology: Recent Results (from the past 240 hour(s))  Blood culture (routine single)     Status: Abnormal   Collection Time: 09/28/20  5:30 PM   Specimen: BLOOD  Result Value Ref Range Status   Specimen Description   Final    BLOOD RIGHT ANTECUBITAL Performed at [facility], [address] [place], [place] [address]    Special Requests   Final    BOTTLES DRAWN AEROBIC AND ANAEROBIC Blood Culture results may not be optimal due to an inadequate volume of blood received in culture bottles Performed at [facility], [address], [place], [place] [address]    Culture  Setup Time   Final    GRAM NEGATIVE RODS AEROBIC BOTTLE ONLY CRITICAL RESULT CALLED TO, READ BACK BY AND VERIFIED WITH: PHARMD T GREEN [id number] AT 1244 BY CM Performed at [facility], [address] [place], [place] [address]    Culture ESCHERICHIA COLI (A)   Final   Report Status 10/01/2020 FINAL  Final   Organism ID, Bacteria ESCHERICHIA COLI  Final      Susceptibility   Escherichia coli - MIC*    AMPICILLIN 4 SENSITIVE Sensitive     CEFAZOLIN <=4 SENSITIVE Sensitive     CEFEPIME <=0.12 SENSITIVE Sensitive     CEFTAZIDIME <=1 SENSITIVE Sensitive     CEFTRIAXONE <=0.25 SENSITIVE Sensitive     CIPROFLOXACIN <=0.25 SENSITIVE Sensitive     GENTAMICIN <=1 SENSITIVE Sensitive     IMIPENEM <=0.25 SENSITIVE Sensitive     TRIMETH/SULFA <=20 SENSITIVE Sensitive     AMPICILLIN/SULBACTAM <=2 SENSITIVE Sensitive     PIP/TAZO <=4 SENSITIVE Sensitive     * ESCHERICHIA COLI  Blood Culture ID Panel (Reflexed)     Status: Abnormal   Collection Time: 09/28/20  5:30 PM  Result Value Ref Range Status   Enterococcus faecalis NOT DETECTED NOT DETECTED Final   Enterococcus Faecium NOT DETECTED NOT DETECTED Final   Listeria monocytogenes NOT DETECTED NOT DETECTED Final   Staphylococcus species NOT DETECTED NOT DETECTED Final   Staphylococcus aureus (BCID) NOT DETECTED NOT DETECTED Final   Staphylococcus epidermidis NOT DETECTED NOT DETECTED Final   Staphylococcus lugdunensis NOT DETECTED NOT DETECTED Final   Streptococcus species NOT DETECTED NOT  DETECTED Final   Streptococcus agalactiae NOT DETECTED NOT DETECTED Final   Streptococcus pneumoniae NOT DETECTED NOT DETECTED Final   Streptococcus pyogenes NOT DETECTED NOT DETECTED Final   A.calcoaceticus-baumannii NOT DETECTED NOT DETECTED Final   Bacteroides fragilis NOT DETECTED NOT DETECTED Final   Enterobacterales DETECTED (A) NOT DETECTED Final    Comment: Enterobacterales represent a large order of gram negative bacteria, not a single organism. CRITICAL RESULT CALLED TO, READ BACK BY AND VERIFIED WITH: PHARMD T GREEN [id number] AR 1242 BY CM    Enterobacter cloacae complex NOT DETECTED NOT DETECTED Final   Escherichia coli DETECTED (A) NOT DETECTED Final    Comment: CRITICAL RESULT CALLED TO, READ BACK BY  AND VERIFIED WITH: PHARMD T GREEN [id number] AT 1242 BY CM    Klebsiella aerogenes NOT DETECTED NOT DETECTED Final   Klebsiella oxytoca NOT DETECTED NOT DETECTED Final   Klebsiella pneumoniae NOT DETECTED NOT DETECTED Final   Proteus species NOT DETECTED NOT DETECTED Final   Salmonella species NOT DETECTED NOT DETECTED Final   Serratia marcescens NOT DETECTED NOT DETECTED Final   Haemophilus influenzae NOT DETECTED NOT DETECTED Final   Neisseria meningitidis NOT DETECTED NOT DETECTED Final   Pseudomonas aeruginosa NOT DETECTED NOT DETECTED Final   Stenotrophomonas maltophilia NOT DETECTED NOT DETECTED Final   Candida albicans NOT DETECTED NOT DETECTED Final   Candida auris NOT DETECTED NOT DETECTED Final   Candida glabrata NOT DETECTED NOT DETECTED Final   Candida krusei NOT DETECTED NOT DETECTED Final   Candida parapsilosis NOT DETECTED NOT DETECTED Final   Candida tropicalis NOT DETECTED NOT DETECTED Final   Cryptococcus neoformans/gattii NOT DETECTED NOT DETECTED Final   CTX-M ESBL NOT DETECTED NOT DETECTED Final   Carbapenem resistance IMP NOT DETECTED NOT DETECTED Final   Carbapenem resistance KPC NOT DETECTED NOT DETECTED Final   Carbapenem resistance NDM NOT DETECTED NOT DETECTED Final   Carbapenem resist OXA 48 LIKE NOT DETECTED NOT DETECTED Final   Carbapenem resistance VIM NOT DETECTED NOT DETECTED Final    Comment: Performed at [facility], [address], [place], [place] [address]  Resp Panel by RT-PCR (Flu A&B, Covid) Nasopharyngeal Swab     Status: None   Collection Time: 09/28/20  5:38 PM   Specimen: Nasopharyngeal Swab; Nasopharyngeal(NP) swabs in vial transport medium  Result Value Ref Range Status   SARS Coronavirus 2 by RT PCR NEGATIVE NEGATIVE Final    Comment: (NOTE) SARS-CoV-2 target nucleic acids are NOT DETECTED.  The SARS-CoV-2 RNA is generally detectable in upper respiratory specimens during the acute phase of infection. The lowest concentration of  SARS-CoV-2 viral copies this assay can detect is 138 copies/mL. A negative result does not preclude SARS-Cov-2 infection and should not be used as the sole basis for treatment or other patient management decisions. A negative result may occur with  improper specimen collection/handling, submission of specimen other than nasopharyngeal swab, presence of viral mutation(s) within the areas targeted by this assay, and inadequate number of viral copies(<138 copies/mL). A negative result must be combined with clinical observations, patient history, and epidemiological information. The expected result is Negative.  Fact Sheet for Patients:  BloggerCourse.com  Fact Sheet for Healthcare Providers:  SeriousBroker.it  This test is no t yet approved or cleared by the [place] FDA and  has been authorized for detection and/or diagnosis of SARS-CoV-2 by FDA under an Emergency Use Authorization (EUA). This EUA will remain  in effect (meaning this test can be used)  for the duration of the COVID-19 declaration under Section 564(b)(1) of the Act, 21 U.S.C.section 360bbb-3(b)(1), unless the authorization is terminated  or revoked sooner.       Influenza A by PCR NEGATIVE NEGATIVE Final   Influenza B by PCR NEGATIVE NEGATIVE Final    Comment: (NOTE) The Xpert Xpress SARS-CoV-2/FLU/RSV plus assay is intended as an aid in the diagnosis of influenza from Nasopharyngeal swab specimens and should not be used as a sole basis for treatment. Nasal washings and aspirates are unacceptable for Xpert Xpress SARS-CoV-2/FLU/RSV testing.  Fact Sheet for Patients: BloggerCourse.com  Fact Sheet for Healthcare Providers: SeriousBroker.it  This test is not yet approved or cleared by the [place] FDA and has been authorized for detection and/or diagnosis of SARS-CoV-2 by FDA under an Emergency Use  Authorization (EUA). This EUA will remain in effect (meaning this test can be used) for the duration of the COVID-19 declaration under Section 564(b)(1) of the Act, 21 U.S.C. section 360bbb-3(b)(1), unless the authorization is terminated or revoked.  Performed at [facility], [address] [place], [place] [address]   Urine Culture     Status: Abnormal (Preliminary result)   Collection Time: 09/28/20  9:00 PM   Specimen: In/Out Cath Urine  Result Value Ref Range Status   Specimen Description   Final    IN/OUT CATH URINE Performed at [facility], [address] [place], [place] [address]    Special Requests   Final    NONE Performed at [facility], [address] [place], [place] [address]    Culture (A)  Final    >=100,000 COLONIES/mL ESCHERICHIA COLI CULTURE REINCUBATED FOR BETTER GROWTH Performed at [facility], [address], [place], [place] [address]    Report Status PENDING  Incomplete   Organism ID, Bacteria ESCHERICHIA COLI (A)  Final      Susceptibility   Escherichia coli - MIC*    AMPICILLIN 4 SENSITIVE Sensitive     CEFAZOLIN <=4 SENSITIVE Sensitive     CEFEPIME <=0.12 SENSITIVE Sensitive     CEFTRIAXONE <=0.25 SENSITIVE Sensitive     CIPROFLOXACIN <=0.25 SENSITIVE Sensitive     GENTAMICIN <=1 SENSITIVE Sensitive     IMIPENEM <=0.25 SENSITIVE Sensitive     NITROFURANTOIN <=16 SENSITIVE Sensitive     TRIMETH/SULFA <=20 SENSITIVE Sensitive     AMPICILLIN/SULBACTAM <=2 SENSITIVE Sensitive     PIP/TAZO <=4 SENSITIVE Sensitive     * >=100,000 COLONIES/mL ESCHERICHIA COLI    Impression/Plan:  1. E coli bacteremia - urinary source with a positive urine culture as well.  Will transition to oral amoxicillin through 10/04/20.    2. Leukocytosis - improving and down to 11.8 today.    I will sign off, call with any questions

## 2020-10-01 NOTE — Care Management Important Message (Signed)
Important Message  Patient Details IM Letter placed in Patient's room. Name: Kimberly Gregory MRN: [id number] Date of Birth: 1935/06/02   Medicare Important Message Given:  Yes     Caren Macadam 10/01/2020, 12:01 PM

## 2020-10-02 LAB — CBC
HCT: 24 % — ABNORMAL LOW (ref 36.0–46.0)
Hemoglobin: 8.1 g/dL — ABNORMAL LOW (ref 12.0–15.0)
MCH: 26 pg (ref 26.0–34.0)
MCHC: 33.8 g/dL (ref 30.0–36.0)
MCV: 77.2 fL — ABNORMAL LOW (ref 80.0–100.0)
Platelets: 175 10*3/uL (ref 150–400)
RBC: 3.11 MIL/uL — ABNORMAL LOW (ref 3.87–5.11)
RDW: 19.6 % — ABNORMAL HIGH (ref 11.5–15.5)
WBC: 7.1 10*3/uL (ref 4.0–10.5)
nRBC: 0 % (ref 0.0–0.2)

## 2020-10-02 LAB — SARS CORONAVIRUS 2 (TAT 6-24 HRS): SARS Coronavirus 2: NEGATIVE

## 2020-10-02 MED ORDER — POLYSACCHARIDE IRON COMPLEX 150 MG PO CAPS
150.0000 mg | ORAL_CAPSULE | Freq: Every day | ORAL | Status: DC
  Administered 2020-10-02 – 2020-10-03 (×2): 150 mg via ORAL
  Filled 2020-10-02 (×2): qty 1

## 2020-10-02 MED ORDER — MELATONIN 3 MG PO TABS
3.0000 mg | ORAL_TABLET | Freq: Every day | ORAL | Status: DC
  Administered 2020-10-02: 3 mg via ORAL
  Filled 2020-10-02: qty 1

## 2020-10-02 NOTE — Plan of Care (Signed)
  Problem: Clinical Measurements: Goal: Ability to maintain clinical measurements within normal limits will improve 10/02/2020 1637 by Iva Boop, RN Outcome: Progressing 10/02/2020 1637 by Iva Boop, RN Outcome: Progressing Goal: Will remain free from infection 10/02/2020 1637 by Iva Boop, RN Outcome: Progressing 10/02/2020 1637 by Iva Boop, RN Outcome: Progressing Goal: Diagnostic test results will improve Outcome: Progressing Goal: Respiratory complications will improve Outcome: Progressing Goal: Cardiovascular complication will be avoided Outcome: Progressing   Problem: Nutrition: Goal: Adequate nutrition will be maintained Outcome: Progressing   Problem: Pain Managment: Goal: General experience of comfort will improve Outcome: Progressing   Problem: Skin Integrity: Goal: Risk for impaired skin integrity will decrease Outcome: Progressing

## 2020-10-02 NOTE — TOC Progression Note (Signed)
Transition of Care [facility]) - Progression Note    Patient Details  Name: ARIE GABLE MRN: [id number] Date of Birth: 1935/12/28  [facility] CM/SW Contact  Darleene Cleaver, [place] Phone Number: 10/02/2020, 4:10 PM  Clinical Narrative:    CSW contacted Wilkie Aye RN [phone] at [facility] care.  CSW asked if patient is able to discharge today, because per MD she is medically ready for discharge.  Per Wilkie Aye, there is not a nurse available to accept patient today, but she will go in tomorrow to accept patient.  CSW updated attending physician and bedside nurse.  CSW asked if a new Covid test is needed, since the current one is dated 7/23 and she said no that one will work.  CSW to facilitate discharge planning tomorrow.    Expected Discharge Plan: Memory Care Barriers to Discharge: Continued Medical Work up  Expected Discharge Plan and Services Expected Discharge Plan: Memory Care       Living arrangements for the past 2 months: Assisted Living Facility                                       Social Determinants of Health (SDOH) Interventions    Readmission Risk Interventions No flowsheet data found.

## 2020-10-02 NOTE — Progress Notes (Addendum)
PROGRESS NOTE    Kimberly Gregory  WUJ:[id number] DOB: [birth year] DOA: 09/28/2020 PCP: Lucky CowboyMcKeown, William, MD   Chief Complaint  Patient presents with   Altered Mental Status   Brief Narrative: 85 year-old female with history of dementia, hypertension, recent AKI, chronic kidney disease 3, GERD, history of ischemic colitis vertigo depression hyperlipidemia anemia of chronic disease with recent admission for UTI brought from the facility with altered mental status, fever and culture data suggestive of E coli UTI/Bacteremia.  Subjective: Initially pleasant, but becomes more short.  Alert, believes it is May 12th, she is at home, and cannot recall the year. Overnight no acute events.  Assessment & Plan:  Acute metabolic encephalopathy in the setting of dementia Mentation appears to have improved but with advanced dementia.  Continue supportive measures: fall precaution ,delirium precaution, continue underlying UTI treatment. At baseline she knows her name and able to recognize her family.  - has mittens, can probably try removal during the day and placement at night - Plan for discharge to Memory care assisted living  E Coli Bacteremia E Coli UTI Sepsis, resolved, POA - Lactic acidosis 4.4 7/20 early am, resolved - C/w Ceftriaxone - Blood Cx, Urine Cx + for E Coli, pan-sensitive  Recent Labs  Lab 09/28/20 1730 09/29/20 0400 09/29/20 0407 09/29/20 1031 09/30/20 0448 10/01/20 0417 10/02/20 0432  WBC 12.3* 2.7*  --   --  17.9* 11.8* 7.1  LATICACIDVEN 1.6  --  4.4* 1.7  --   --   --   PROCALCITON  --  7.41  --   --   --   --   --    Recurrent UTIs  - consider vaginal estrogen to decrease risk; can Rx on discharge  GERD on PPI Hypertension BP stable currently soft on admission.  - Not on Home meds, CTM  Mild hypokalemia replacement ordered; monitor BMP  Chronic Anemia, iron deficiency Anemia panel shows iron less than 5, B12 381 - can give PO iron supp: iron polysacc less  constipating  GOC: Confirmed DNR status with patient's son.  Palliative care has been consulted, to f/u outpatient.    Diet Order             Diet Heart Room service appropriate? Yes; Fluid consistency: Thin  Diet effective now                   Patient's Body mass index is 27.91 kg/m. DVT prophylaxis: enoxaparin (LOVENOX) injection 40 mg Start: 09/29/20 1000 Code Status:   Code Status: DNR  Family Communication: plan of care discussed with patient's son at bedside. Status is: Inpatient Remains inpatient appropriate because:IV treatments appropriate due to intensity of illness or inability to take PO and Inpatient level of care appropriate due to severity of illness Dispo: The patient is from: SNF              Anticipated d/c is to: SNF likely 7/24 if remains stable.              Patient currently is medically stable to d/c.   Difficult to place patient No  Unresulted Labs (From admission, onward)     Start     Ordered   10/05/20 0500  Creatinine, serum  (enoxaparin (LOVENOX)    CrCl >/= 30 ml/min)  Weekly,   R     Comments: while on enoxaparin therapy    09/29/20 0015           Medications reviewed:  Scheduled Meds:  acetaminophen  650 mg Oral Once   amoxicillin  500 mg Oral Q8H   ARIPiprazole  2 mg Oral Daily   divalproex  250 mg Oral TID with meals   enoxaparin (LOVENOX) injection  40 mg Subcutaneous Q24H   feeding supplement  1 Container Oral Q24H   feeding supplement  237 mL Oral Q24H   melatonin  3 mg Oral QHS   multivitamin with minerals  1 tablet Oral Daily   sertraline  25 mg Oral Daily   vitamin B-12  500 mcg Oral Daily   Continuous Infusions:   Consultants:see note  Procedures:see note Antimicrobials: Anti-infectives (From admission, onward)    Start     Dose/Rate Route Frequency Ordered Stop   10/01/20 1615  amoxicillin (AMOXIL) capsule 500 mg        500 mg Oral Every 8 hours 10/01/20 1527     09/30/20 2200  cefTRIAXone (ROCEPHIN) 2 g in  sodium chloride 0.9 % 100 mL IVPB  Status:  Discontinued        2 g 200 mL/hr over 30 Minutes Intravenous Every 24 hours 09/30/20 1135 10/01/20 1527   09/29/20 1100  ceFEPIme (MAXIPIME) 2 g in sodium chloride 0.9 % 100 mL IVPB  Status:  Discontinued        2 g 200 mL/hr over 30 Minutes Intravenous Every 12 hours 09/28/20 2313 09/30/20 1135   09/29/20 0030  ceFEPIme (MAXIPIME) 2 g in sodium chloride 0.9 % 100 mL IVPB  Status:  Discontinued        2 g 200 mL/hr over 30 Minutes Intravenous  Once 09/29/20 0015 09/29/20 0017   09/28/20 2215  ceFEPIme (MAXIPIME) 2 g in sodium chloride 0.9 % 100 mL IVPB        2 g 200 mL/hr over 30 Minutes Intravenous  Once 09/28/20 2213 09/29/20 0016      Culture/Microbiology    Component Value Date/Time   SDES  09/28/2020 2100    IN/OUT CATH URINE Performed at [facility], [address] [place], [place] [address]    SPECREQUEST  09/28/2020 2100    NONE Performed at [facility], [address] [place], [place] [address]    CULT (A) 09/28/2020 2100    >=100,000 COLONIES/mL ESCHERICHIA COLI SUSCEPTIBILITIES TO FOLLOW Performed at [facility], [address], [place], [place] [address]    REPTSTATUS PENDING 09/28/2020 2100    Other culture-see note  Objective: Vitals: Today's Vitals   10/01/20 2131 10/02/20 0642 10/02/20 1113 10/02/20 1356  BP: 133/84 (!) 131/58  138/63  Pulse: 100 66  65  Resp: 20 18  14   Temp: 98.7 F (37.1 C) 98.4 F (36.9 C)  98.3 F (36.8 C)  TempSrc: Oral Oral  Oral  SpO2:    100%  Weight:      Height:      PainSc:   0-No pain     Intake/Output Summary (Last 24 hours) at 10/02/2020 1707 Last data filed at 10/02/2020 1300 Gross per 24 hour  Intake 894.35 ml  Output 2325 ml  Net -1430.65 ml    Filed Weights   09/28/20 1659  Weight: 67 kg   Weight change:   Intake/Output from previous day: 07/22 0701 - 07/23 0700 In: 894.4 [I.V.:894.4] Out: 1725  [Urine:1725] Intake/Output this shift: Total I/O In: -  Out: 600 [Urine:600] Filed Weights   09/28/20 1659  Weight: 67 kg   Examination: General exam: AAOx  self, pleasant, interactive HEENT:Oral mucosa moist, Ear/Nose WNL grossly, dentition normal. Respiratory system: clear to auscultation anteriorly, no use of accessory muscle Cardiovascular system: S1 & S2, No JVD, Gastrointestinal system: Abdomen soft, NT,ND, BS+ Nervous System:Alert, awake, moving extremities and grossly nonfocal, mitts on. Extremities: no edema, distal peripheral pulses palpable.  Skin: No rashes, no icterus. MSK: Normal muscle bulk  Data Reviewed: I have personally reviewed following labs and imaging studies CBC: Recent Labs  Lab 09/28/20 1730 09/29/20 0400 09/30/20 0448 10/01/20 0417 10/02/20 0432  WBC 12.3* 2.7* 17.9* 11.8* 7.1  NEUTROABS 10.2*  --   --   --   --   HGB 8.6* 9.2* 7.8* 7.7* 8.1*  HCT 25.7* 27.1* 23.4* 23.0* 24.0*  MCV 77.4* 77.9* 78.3* 77.7* 77.2*  PLT 226 184 174 172 175    Basic Metabolic Panel: Recent Labs  Lab 09/28/20 1730 09/29/20 0400 09/30/20 0448 10/01/20 0417  NA 141 143 140 139  K 3.7 3.7 3.6 3.4*  CL 110 111 107 108  CO2 24 21* 25 25  GLUCOSE 108* 85 89 95  BUN 23 29* 31* 26*  CREATININE 1.04* 1.21* 1.18* 0.94  CALCIUM 8.3* 8.3* 8.2* 7.9*    GFR: Estimated Creatinine Clearance: 39.7 mL/min (by C-G formula based on SCr of 0.94 mg/dL). Liver Function Tests: Recent Labs  Lab 09/28/20 1730 09/29/20 0400  AST 28 39  ALT 18 20  ALKPHOS 52 71  BILITOT 0.8 1.1  PROT 6.8 7.3  ALBUMIN 2.9* 2.8*    No results for input(s): LIPASE, AMYLASE in the last 168 hours. No results for input(s): AMMONIA in the last 168 hours. Coagulation Profile: Recent Labs  Lab 09/28/20 1730 09/29/20 0400  INR 1.2 1.4*    Cardiac Enzymes: No results for input(s): CKTOTAL, CKMB, CKMBINDEX, TROPONINI in the last 168 hours. BNP (last 3 results) No results for input(s):  PROBNP in the last 8760 hours. HbA1C: No results for input(s): HGBA1C in the last 72 hours. CBG: No results for input(s): GLUCAP in the last 168 hours. Lipid Profile: No results for input(s): CHOL, HDL, LDLCALC, TRIG, CHOLHDL, LDLDIRECT in the last 72 hours. Thyroid Function Tests: No results for input(s): TSH, T4TOTAL, FREET4, T3FREE, THYROIDAB in the last 72 hours. Anemia Panel: Recent Labs    09/30/20 1158  VITAMINB12 381  FOLATE 12.4  FERRITIN 37  TIBC NOT CALCULATED  IRON <5*  RETICCTPCT 1.3    Sepsis Labs: Recent Labs  Lab 09/28/20 1730 09/29/20 0400 09/29/20 0407 09/29/20 1031  PROCALCITON  --  7.41  --   --   LATICACIDVEN 1.6  --  4.4* 1.7     Recent Results (from the past 240 hour(s))  Blood culture (routine single)     Status: Abnormal   Collection Time: 09/28/20  5:30 PM   Specimen: BLOOD  Result Value Ref Range Status   Specimen Description   Final    BLOOD RIGHT ANTECUBITAL Performed at [facility], [address], [place], [place] [address]    Special Requests   Final    BOTTLES DRAWN AEROBIC AND ANAEROBIC Blood Culture results may not be optimal due to an inadequate volume of blood received in culture bottles Performed at [facility], [address] [place], [place] [address]    Culture  Setup Time   Final    GRAM NEGATIVE RODS AEROBIC BOTTLE ONLY CRITICAL RESULT CALLED TO, READ BACK BY AND VERIFIED WITH: PHARMD T GREEN [id number] AT 1244 BY CM  Performed at [facility], [address], [place], [place] [address]    Culture ESCHERICHIA COLI (A)  Final   Report Status 10/01/2020 FINAL  Final   Organism ID, Bacteria ESCHERICHIA COLI  Final      Susceptibility   Escherichia coli - MIC*    AMPICILLIN 4 SENSITIVE Sensitive     CEFAZOLIN <=4 SENSITIVE Sensitive     CEFEPIME <=0.12 SENSITIVE Sensitive     CEFTAZIDIME <=1 SENSITIVE Sensitive     CEFTRIAXONE <=0.25 SENSITIVE Sensitive     CIPROFLOXACIN  <=0.25 SENSITIVE Sensitive     GENTAMICIN <=1 SENSITIVE Sensitive     IMIPENEM <=0.25 SENSITIVE Sensitive     TRIMETH/SULFA <=20 SENSITIVE Sensitive     AMPICILLIN/SULBACTAM <=2 SENSITIVE Sensitive     PIP/TAZO <=4 SENSITIVE Sensitive     * ESCHERICHIA COLI  Blood Culture ID Panel (Reflexed)     Status: Abnormal   Collection Time: 09/28/20  5:30 PM  Result Value Ref Range Status   Enterococcus faecalis NOT DETECTED NOT DETECTED Final   Enterococcus Faecium NOT DETECTED NOT DETECTED Final   Listeria monocytogenes NOT DETECTED NOT DETECTED Final   Staphylococcus species NOT DETECTED NOT DETECTED Final   Staphylococcus aureus (BCID) NOT DETECTED NOT DETECTED Final   Staphylococcus epidermidis NOT DETECTED NOT DETECTED Final   Staphylococcus lugdunensis NOT DETECTED NOT DETECTED Final   Streptococcus species NOT DETECTED NOT DETECTED Final   Streptococcus agalactiae NOT DETECTED NOT DETECTED Final   Streptococcus pneumoniae NOT DETECTED NOT DETECTED Final   Streptococcus pyogenes NOT DETECTED NOT DETECTED Final   A.calcoaceticus-baumannii NOT DETECTED NOT DETECTED Final   Bacteroides fragilis NOT DETECTED NOT DETECTED Final   Enterobacterales DETECTED (A) NOT DETECTED Final    Comment: Enterobacterales represent a large order of gram negative bacteria, not a single organism. CRITICAL RESULT CALLED TO, READ BACK BY AND VERIFIED WITH: PHARMD T GREEN [id number] AR 1242 BY CM    Enterobacter cloacae complex NOT DETECTED NOT DETECTED Final   Escherichia coli DETECTED (A) NOT DETECTED Final    Comment: CRITICAL RESULT CALLED TO, READ BACK BY AND VERIFIED WITH: PHARMD T GREEN [id number] AT 1242 BY CM    Klebsiella aerogenes NOT DETECTED NOT DETECTED Final   Klebsiella oxytoca NOT DETECTED NOT DETECTED Final   Klebsiella pneumoniae NOT DETECTED NOT DETECTED Final   Proteus species NOT DETECTED NOT DETECTED Final   Salmonella species NOT DETECTED NOT DETECTED Final   Serratia marcescens NOT DETECTED  NOT DETECTED Final   Haemophilus influenzae NOT DETECTED NOT DETECTED Final   Neisseria meningitidis NOT DETECTED NOT DETECTED Final   Pseudomonas aeruginosa NOT DETECTED NOT DETECTED Final   Stenotrophomonas maltophilia NOT DETECTED NOT DETECTED Final   Candida albicans NOT DETECTED NOT DETECTED Final   Candida auris NOT DETECTED NOT DETECTED Final   Candida glabrata NOT DETECTED NOT DETECTED Final   Candida krusei NOT DETECTED NOT DETECTED Final   Candida parapsilosis NOT DETECTED NOT DETECTED Final   Candida tropicalis NOT DETECTED NOT DETECTED Final   Cryptococcus neoformans/gattii NOT DETECTED NOT DETECTED Final   CTX-M ESBL NOT DETECTED NOT DETECTED Final   Carbapenem resistance IMP NOT DETECTED NOT DETECTED Final   Carbapenem resistance KPC NOT DETECTED NOT DETECTED Final   Carbapenem resistance NDM NOT DETECTED NOT DETECTED Final   Carbapenem resist OXA 48 LIKE NOT DETECTED NOT DETECTED Final   Carbapenem resistance VIM NOT DETECTED NOT DETECTED Final    Comment: Performed at [facility],  [address], [place], [place] [address]  Resp Panel by RT-PCR (Flu A&B, Covid) Nasopharyngeal Swab     Status: None   Collection Time: 09/28/20  5:38 PM   Specimen: Nasopharyngeal Swab; Nasopharyngeal(NP) swabs in vial transport medium  Result Value Ref Range Status   SARS Coronavirus 2 by RT PCR NEGATIVE NEGATIVE Final    Comment: (NOTE) SARS-CoV-2 target nucleic acids are NOT DETECTED.  The SARS-CoV-2 RNA is generally detectable in upper respiratory specimens during the acute phase of infection. The lowest concentration of SARS-CoV-2 viral copies this assay can detect is 138 copies/mL. A negative result does not preclude SARS-Cov-2 infection and should not be used as the sole basis for treatment or other patient management decisions. A negative result may occur with  improper specimen collection/handling, submission of specimen other than nasopharyngeal swab, presence of viral  mutation(s) within the areas targeted by this assay, and inadequate number of viral copies(<138 copies/mL). A negative result must be combined with clinical observations, patient history, and epidemiological information. The expected result is Negative.  Fact Sheet for Patients:  BloggerCourse.com  Fact Sheet for Healthcare Providers:  SeriousBroker.it  This test is no t yet approved or cleared by the [place] FDA and  has been authorized for detection and/or diagnosis of SARS-CoV-2 by FDA under an Emergency Use Authorization (EUA). This EUA will remain  in effect (meaning this test can be used) for the duration of the COVID-19 declaration under Section 564(b)(1) of the Act, 21 U.S.C.section 360bbb-3(b)(1), unless the authorization is terminated  or revoked sooner.       Influenza A by PCR NEGATIVE NEGATIVE Final   Influenza B by PCR NEGATIVE NEGATIVE Final    Comment: (NOTE) The Xpert Xpress SARS-CoV-2/FLU/RSV plus assay is intended as an aid in the diagnosis of influenza from Nasopharyngeal swab specimens and should not be used as a sole basis for treatment. Nasal washings and aspirates are unacceptable for Xpert Xpress SARS-CoV-2/FLU/RSV testing.  Fact Sheet for Patients: BloggerCourse.com  Fact Sheet for Healthcare Providers: SeriousBroker.it  This test is not yet approved or cleared by the [place] FDA and has been authorized for detection and/or diagnosis of SARS-CoV-2 by FDA under an Emergency Use Authorization (EUA). This EUA will remain in effect (meaning this test can be used) for the duration of the COVID-19 declaration under Section 564(b)(1) of the Act, 21 U.S.C. section 360bbb-3(b)(1), unless the authorization is terminated or revoked.  Performed at [facility], [address] [place], [place] [address]   Urine Culture      Status: Abnormal (Preliminary result)   Collection Time: 09/28/20  9:00 PM   Specimen: In/Out Cath Urine  Result Value Ref Range Status   Specimen Description   Final    IN/OUT CATH URINE Performed at [facility], [address] [place], [place] [address]    Special Requests   Final    NONE Performed at [facility], [address] [place], [place] [address]    Culture (A)  Final    >=100,000 COLONIES/mL ESCHERICHIA COLI SUSCEPTIBILITIES TO FOLLOW Performed at [facility], [address] [place], [place] [address]    Report Status PENDING  Incomplete   Organism ID, Bacteria ESCHERICHIA COLI (A)  Final      Susceptibility   Escherichia coli - MIC*    AMPICILLIN 4 SENSITIVE Sensitive     CEFAZOLIN <=4 SENSITIVE Sensitive     CEFEPIME <=0.12 SENSITIVE Sensitive  CEFTRIAXONE <=0.25 SENSITIVE Sensitive     CIPROFLOXACIN <=0.25 SENSITIVE Sensitive     GENTAMICIN <=1 SENSITIVE Sensitive     IMIPENEM <=0.25 SENSITIVE Sensitive     NITROFURANTOIN <=16 SENSITIVE Sensitive     TRIMETH/SULFA <=20 SENSITIVE Sensitive     AMPICILLIN/SULBACTAM <=2 SENSITIVE Sensitive     PIP/TAZO <=4 SENSITIVE Sensitive     * >=100,000 COLONIES/mL ESCHERICHIA COLI  SARS CORONAVIRUS 2 (TAT 6-24 HRS) Nasopharyngeal Nasopharyngeal Swab     Status: None   Collection Time: 10/02/20 12:20 AM   Specimen: Nasopharyngeal Swab  Result Value Ref Range Status   SARS Coronavirus 2 NEGATIVE NEGATIVE Final    Comment: (NOTE) SARS-CoV-2 target nucleic acids are NOT DETECTED.  The SARS-CoV-2 RNA is generally detectable in upper and lower respiratory specimens during the acute phase of infection. Negative results do not preclude SARS-CoV-2 infection, do not rule out co-infections with other pathogens, and should not be used as the sole basis for treatment or other patient management decisions. Negative results must be combined with clinical observations, patient  history, and epidemiological information. The expected result is Negative.  Fact Sheet for Patients: HairSlick.no  Fact Sheet for Healthcare Providers: quierodirigir.com  This test is not yet approved or cleared by the [place] FDA and  has been authorized for detection and/or diagnosis of SARS-CoV-2 by FDA under an Emergency Use Authorization (EUA). This EUA will remain  in effect (meaning this test can be used) for the duration of the COVID-19 declaration under Se ction 564(b)(1) of the Act, 21 U.S.C. section 360bbb-3(b)(1), unless the authorization is terminated or revoked sooner.  Performed at [facility], [address] [place], [place] [address]       Radiology Studies: No results found.   LOS: 4 days   Rachael Fee, MD MPH [facility]  10/02/2020, 5:07 PM

## 2020-10-03 MED ORDER — AMOXICILLIN 500 MG PO CAPS: 500.0000 mg | ORAL_CAPSULE | Freq: Three times a day (TID) | ORAL | 0 refills | Status: AC

## 2020-10-03 MED ORDER — ACETAMINOPHEN 325 MG PO TABS: 650.0000 mg | ORAL_TABLET | Freq: Four times a day (QID) | ORAL | 0 refills | Status: AC | PRN

## 2020-10-03 MED ORDER — SENNA 8.6 MG PO TABS: 1.0000 | ORAL_TABLET | Freq: Every day | ORAL | 0 refills | Status: AC | PRN

## 2020-10-03 MED ORDER — POLYSACCHARIDE IRON COMPLEX 150 MG PO CAPS: 150.0000 mg | ORAL_CAPSULE | Freq: Every day | ORAL | 0 refills | Status: AC

## 2020-10-03 MED ORDER — ESTROGENS, CONJUGATED 0.625 MG/GM VA CREA
1.0000 | TOPICAL_CREAM | Freq: Every day | VAGINAL | Status: DC
  Administered 2020-10-03: 1 via VAGINAL
  Filled 2020-10-03: qty 30

## 2020-10-03 MED ORDER — MELATONIN 3 MG PO TABS: 3.0000 mg | ORAL_TABLET | Freq: Every day | ORAL | 0 refills | Status: AC

## 2020-10-03 MED ORDER — ESTROGENS, CONJUGATED 0.625 MG/GM VA CREA: 1.0000 | TOPICAL_CREAM | Freq: Every day | VAGINAL | 2 refills | Status: AC

## 2020-10-03 NOTE — Discharge Summary (Addendum)
Physician Discharge Summary  Patient ID: Kimberly Gregory MRN: [id number] DOB/AGE: 1935-09-08 85 y.o.  Admit date: 09/28/2020 Discharge date: 10/03/2020  Admission Diagnoses: Acute metabolic encephalopathy Sepsis 2/2 UTI  Discharge Diagnoses:  Principal Problem:   Sepsis secondary to UTI [facility]) Active Problems:   Benign essential HTN   Dementia with behavioral disturbance (HCC)   Gastroesophageal reflux disease without esophagitis   Urinary tract infection with hematuria   Hypotension   Discharged Condition: stable  Hospital Course:  85 year-old female with history of dementia, hypertension, recent AKI, chronic kidney disease 3, GERD, history of ischemic colitis with recent admission for UTI brought from the facility with altered mental status, fever. Culture data showed E coli in the urine and  in the blood. She was treated with IV Ceftriaxone and transitioned to oral amoxicillin. She should finish her antibiotics on 7/25.  Other problems:  Recurrent UTIs  - Son reports significant concern regarding recurrent UTI. Will trial vaginal estrogen to decrease risk rather than antibiotics which will increase risk of resistant strains. She was also started on daily senna PRN to avoid constipation, as this can also aggravate UTI.   GERD continue home PPI  Hypertension BP stable  - Not on Home meds, CTM  Chronic Anemia - this was thought to be related to iron deficiency and she was started on iron polysaccharide. _________________________  Consults: ID and Palliative care  Significant Diagnostic Studies: Urine Culture, Blood Culture notable for pan sensitive E coli  Treatments: IV hydration and antibiotics: ceftriaxone and amoxicillin  Discharge Exam: Blood pressure 132/65, pulse 71, temperature 98.2 F (36.8 C), resp. rate 16, height 5\' 1"  (1.549 m), weight 67 kg, SpO2 100 %.  General exam: AAOx self, pleasant, interactive HEENT:Oral mucosa moist, Ear/Nose WNL grossly,  dentition normal. Respiratory system: clear to auscultation anteriorly, no use of accessory muscle Cardiovascular system: S1 & S2, No JVD, Gastrointestinal system: Abdomen soft, NT,ND, BS+ Nervous System: Alert, awake, moving extremities and grossly nonfocal, mitts on. Extremities: no edema, distal peripheral pulses palpable. Skin: No rashes, no icterus. MSK: Normal muscle bulk  Disposition:  There are no questions and answers to display.      Allergies as of 10/03/2020   No Known Allergies      Medication List     TAKE these medications    acetaminophen 325 MG tablet Commonly known as: TYLENOL Take 2 tablets (650 mg total) by mouth every 6 (six) hours as needed for mild pain (or Fever >/= 101).   amoxicillin 500 MG capsule Commonly known as: AMOXIL Take 1 capsule (500 mg total) by mouth every 8 (eight) hours for 5 doses.   ARIPiprazole 2 MG tablet Commonly known as: ABILIFY Take 2 mg by mouth daily.   conjugated estrogens vaginal cream Commonly known as: PREMARIN Place 1 Applicatorful vaginally daily.   divalproex 125 MG capsule Commonly known as: DEPAKOTE SPRINKLE Take 250 mg by mouth with breakfast, with lunch, and with evening meal.   feeding supplement Liqd Take 237 mLs by mouth daily.   iron polysaccharides 150 MG capsule Commonly known as: NIFEREX Take 1 capsule (150 mg total) by mouth daily. Start taking on: October 04, 2020   melatonin 3 MG Tabs tablet Take 1 tablet (3 mg total) by mouth daily at 6 PM. What changed: when to take this   multivitamin with minerals Tabs tablet Take 1 tablet by mouth daily.   ondansetron 4 MG tablet Commonly known as: ZOFRAN Take 1 tablet (4 mg total)  by mouth every 8 (eight) hours as needed for nausea or vomiting.   senna 8.6 MG Tabs tablet Commonly known as: SENOKOT Take 1 tablet (8.6 mg total) by mouth daily as needed for mild constipation (no bowel movement).   sertraline 25 MG tablet Commonly known as:  ZOLOFT Take 25 mg by mouth daily.   Vitamin D3 1.25 MG (50000 UT) Caps Take 1 capsule 3  /week for Vitamin D Deficiency         Signed: Rachael Fee, MD MPH 10/03/2020, 10:48 AM  I spent >30 minutes in preparation and discharge planning.

## 2020-10-03 NOTE — TOC Transition Note (Signed)
Transition of Care [facility]) - CM/SW Discharge Note   Patient Details  Name: Kimberly Gregory MRN: [id number] Date of Birth: 1936-01-26  Transition of Care [facility]) CM/SW Contact:  Chrisean Kloth, Vinnie Langton, LCSW Phone Number: 10/03/2020, 11:17 AM   Clinical Narrative:     LCSW spoke with [facility] and confirmed that patient is able to return to the facility today, as patient is medically stable and ready for discharge.  FL-2 Form and Discharge Summary have been faxed to [facility].  Left vm for son, Lakysha Kossman, awaiting a return call.  Spoke with daughter, Rubbie Battiest.  Mrs. Rosezetta Schlatter is concerned about patient being discharged back to the facility today.   Final next level of care: Memory Care Barriers to Discharge: Continued Medical Work up   Patient Goals and CMS Choice Patient states their goals for this hospitalization and ongoing recovery are:: Return to [facility] Memory Care CMS Medicare.gov Compare Post Acute Care list provided to:: Other (Comment Required)    Discharge Placement  [facility].                  Name of family member notified: Left message on son - [name].  Spoke with daughter - Sherryl Manges Patient and family notified of of transfer: 10/03/20  Discharge Plan and Services   Back to memory care ALF.                                   Social Determinants of Health (SDOH) Interventions     Readmission Risk Interventions No flowsheet data found.

## 2020-10-03 NOTE — Plan of Care (Signed)
Discharge education sent with pt to ALF. AVS sent in discharge packet. Pt transported with son via personal vehicle. Pt safety maintained.  Problem: Education: Goal: Knowledge of General Education information will improve Description: Including pain rating scale, medication(s)/side effects and non-pharmacologic comfort measures Outcome: Adequate for Discharge   Problem: Health Behavior/Discharge Planning: Goal: Ability to manage health-related needs will improve Outcome: Adequate for Discharge   Problem: Clinical Measurements: Goal: Ability to maintain clinical measurements within normal limits will improve Outcome: Adequate for Discharge Goal: Will remain free from infection Outcome: Adequate for Discharge Goal: Diagnostic test results will improve Outcome: Adequate for Discharge Goal: Respiratory complications will improve Outcome: Adequate for Discharge Goal: Cardiovascular complication will be avoided Outcome: Adequate for Discharge   Problem: Activity: Goal: Risk for activity intolerance will decrease Outcome: Adequate for Discharge   Problem: Nutrition: Goal: Adequate nutrition will be maintained Outcome: Adequate for Discharge   Problem: Coping: Goal: Level of anxiety will decrease Outcome: Adequate for Discharge   Problem: Elimination: Goal: Will not experience complications related to bowel motility Outcome: Adequate for Discharge Goal: Will not experience complications related to urinary retention Outcome: Adequate for Discharge   Problem: Pain Managment: Goal: General experience of comfort will improve Outcome: Adequate for Discharge   Problem: Safety: Goal: Ability to remain free from injury will improve Outcome: Adequate for Discharge   Problem: Skin Integrity: Goal: Risk for impaired skin integrity will decrease Outcome: Adequate for Discharge

## 2020-10-03 NOTE — NC FL2 (Signed)
[place] MEDICAID FL2 LEVEL OF CARE SCREENING TOOL     IDENTIFICATION  Patient Name: Kimberly Gregory Birthdate: 1935/11/25 Sex: female Admission Date (Current Location): 09/28/2020  [facility] and [place] Number:  Producer, television/film/video and Address:  [facility],  [address] [place], [place] [address]      Provider Number: [id number]  Attending Physician Name and Address:  Rachael Fee, MD  Relative Name and Phone Number:  Carmin Muskrat [facility] - # [phone]    Current Level of Care: Hospital Recommended Level of Care: Assisted Living Facility, Memory Care Prior Approval Number:    Date Approved/Denied:   PASRR Number:    Discharge Plan: Other (Comment) (ALF)    Current Diagnoses: Patient Active Problem List   Diagnosis Date Noted   Sepsis secondary to UTI (HCC) 09/28/2020   Aortic arch atherosclerosis (HCC) CXR 09/2020 09/17/2020   Sepsis (HCC) 09/09/2020   Urinary tract infection with hematuria 09/09/2020   Anemia 09/09/2020   Nausea and vomiting 09/09/2020   Hypotension 09/09/2020   CKD (chronic kidney disease) stage 3, GFR 30-59 ml/min (HCC) 09/09/2020   Visual hallucinations 08/18/2019   Gastroesophageal reflux disease without esophagitis 09/11/2018   Former smoker 09/11/2018   Vitamin D deficiency 09/11/2018   Depressive disorder in remission (HCC) 09/11/2018   Dementia with behavioral disturbance (HCC) 10/21/2016   Benign essential HTN 10/14/2014   Hyperlipidemia 10/14/2014    Orientation RESPIRATION BLADDER Height & Weight     Self  Normal Continent Weight: 147 lb 11.3 oz (67 kg) Height:  5\' 1"  (154.9 cm)  BEHAVIORAL SYMPTOMS/MOOD NEUROLOGICAL BOWEL NUTRITION STATUS      Continent Diet  AMBULATORY STATUS COMMUNICATION OF NEEDS Skin   Supervision Verbally Normal                       Personal Care Assistance Level of Assistance  Bathing, Feeding, Dressing Bathing Assistance: Limited assistance Feeding assistance: Independent Dressing  Assistance: Limited assistance Total Care Assistance: Limited assistance   Functional Limitations Info  Sight, Hearing, Speech Sight Info: Adequate Hearing Info: Adequate Speech Info: Adequate    SPECIAL CARE FACTORS FREQUENCY                       Contractures      Additional Factors Info                  Current Medications (10/03/2020):  This is the current hospital active medication list Current Facility-Administered Medications  Medication Dose Route Frequency Provider Last Rate Last Admin   acetaminophen (TYLENOL) tablet 650 mg  650 mg Oral Q6H PRN 10/05/2020, MD       Or   acetaminophen (TYLENOL) suppository 650 mg  650 mg Rectal Q6H PRN Rometta Emery, MD       acetaminophen (TYLENOL) tablet 650 mg  650 mg Oral Once Rometta Emery, MD       amoxicillin (AMOXIL) capsule 500 mg  500 mg Oral Q8H Koleen Distance, MD   500 mg at 10/03/20 0645   ARIPiprazole (ABILIFY) tablet 2 mg  2 mg Oral Daily 10/05/20, MD   2 mg at 10/03/20 0959   conjugated estrogens (PREMARIN) vaginal cream 1 Applicatorful  1 Applicatorful Vaginal Daily Azu, Crystal, MD       divalproex (DEPAKOTE SPRINKLE) capsule 250 mg  250 mg Oral TID with meals 10/05/20, MD   250 mg  at 10/03/20 0959   enoxaparin (LOVENOX) injection 40 mg  40 mg Subcutaneous Q24H Earlie Lou L, MD   40 mg at 10/03/20 0959   feeding supplement (BOOST / RESOURCE BREEZE) liquid 1 Container  1 Container Oral Q24H Lanae Boast, MD   1 Container at 10/02/20 1709   feeding supplement (ENSURE ENLIVE / ENSURE PLUS) liquid 237 mL  237 mL Oral Q24H Mikeal Hawthorne, Mohammad L, MD   237 mL at 10/03/20 0959   iron polysaccharides (NIFEREX) capsule 150 mg  150 mg Oral Daily Azu, Crystal, MD   150 mg at 10/03/20 0958   levalbuterol (XOPENEX) nebulizer solution 0.63 mg  0.63 mg Nebulization Q6H PRN Kc, Ramesh, MD       melatonin tablet 3 mg  3 mg Oral q1800 Azu, Crystal, MD   3 mg at 10/02/20 2242   multivitamin with  minerals tablet 1 tablet  1 tablet Oral Daily Rometta Emery, MD   1 tablet at 10/03/20 0958   ondansetron (ZOFRAN) tablet 4 mg  4 mg Oral Q6H PRN Rometta Emery, MD       Or   ondansetron (ZOFRAN) injection 4 mg  4 mg Intravenous Q6H PRN Rometta Emery, MD   4 mg at 09/29/20 0318   sertraline (ZOLOFT) tablet 25 mg  25 mg Oral Daily Rometta Emery, MD   25 mg at 10/03/20 5956   vitamin B-12 (CYANOCOBALAMIN) tablet 500 mcg  500 mcg Oral Daily Lanae Boast, MD   500 mcg at 10/03/20 3875     Discharge Medications: Please see discharge summary for a list of discharge medications.  Relevant Imaging Results:  Relevant Lab Results:   Additional Information    Sundeep Cary, Vinnie Langton, LCSW

## 2020-10-03 NOTE — NC FL2 (Signed)
[place] MEDICAID FL2 LEVEL OF CARE SCREENING TOOL     IDENTIFICATION  Patient Name: Kimberly Gregory Birthdate: 02/19/1936 Sex: female Admission Date (Current Location): 09/28/2020  [facility] and [place] Number:  Producer, television/film/video and Address:  [facility],  [address] [place], [place] [address]      Provider Number: [id number]  Attending Physician Name and Address:  Rachael Fee, MD  Relative Name and Phone Number:  Carmin Muskrat [facility] - # [phone]    Current Level of Care: Hospital Recommended Level of Care: Assisted Living Facility, Memory Care Prior Approval Number:    Date Approved/Denied:   PASRR Number:    Discharge Plan: Other (Comment) (ALF)    Current Diagnoses: Patient Active Problem List   Diagnosis Date Noted   Sepsis secondary to UTI (HCC) 09/28/2020   Aortic arch atherosclerosis (HCC) CXR 09/2020 09/17/2020   Sepsis (HCC) 09/09/2020   Urinary tract infection with hematuria 09/09/2020   Anemia 09/09/2020   Nausea and vomiting 09/09/2020   Hypotension 09/09/2020   CKD (chronic kidney disease) stage 3, GFR 30-59 ml/min (HCC) 09/09/2020   Visual hallucinations 08/18/2019   Gastroesophageal reflux disease without esophagitis 09/11/2018   Former smoker 09/11/2018   Vitamin D deficiency 09/11/2018   Depressive disorder in remission (HCC) 09/11/2018   Dementia with behavioral disturbance (HCC) 10/21/2016   Benign essential HTN 10/14/2014   Hyperlipidemia 10/14/2014    Orientation RESPIRATION BLADDER Height & Weight     Self  Normal Continent Weight: 147 lb 11.3 oz (67 kg) Height:  5\' 1"  (154.9 cm)  BEHAVIORAL SYMPTOMS/MOOD NEUROLOGICAL BOWEL NUTRITION STATUS      Continent Diet  AMBULATORY STATUS COMMUNICATION OF NEEDS Skin   Supervision Verbally Normal                       Personal Care Assistance Level of Assistance  Bathing, Feeding, Dressing Bathing Assistance: Limited assistance Feeding assistance: Independent Dressing  Assistance: Limited assistance Total Care Assistance: Limited assistance   Functional Limitations Info  Sight, Hearing, Speech Sight Info: Adequate Hearing Info: Adequate Speech Info: Adequate    SPECIAL CARE FACTORS FREQUENCY                       Contractures Contractures Info: Not present    Additional Factors Info  Code Status, Allergies Code Status Info: DNR Allergies Info: NKA           Current Medications (10/03/2020):  This is the current hospital active medication list Current Facility-Administered Medications  Medication Dose Route Frequency Provider Last Rate Last Admin   acetaminophen (TYLENOL) tablet 650 mg  650 mg Oral Q6H PRN 10/05/2020, MD       Or   acetaminophen (TYLENOL) suppository 650 mg  650 mg Rectal Q6H PRN Rometta Emery, MD       acetaminophen (TYLENOL) tablet 650 mg  650 mg Oral Once Rometta Emery, MD       amoxicillin (AMOXIL) capsule 500 mg  500 mg Oral Q8H Koleen Distance, MD   500 mg at 10/03/20 0645   ARIPiprazole (ABILIFY) tablet 2 mg  2 mg Oral Daily 10/05/20, MD   2 mg at 10/03/20 0959   conjugated estrogens (PREMARIN) vaginal cream 1 Applicatorful  1 Applicatorful Vaginal Daily Azu, Crystal, MD       divalproex (DEPAKOTE SPRINKLE) capsule 250 mg  250 mg Oral TID with meals 10/05/20  L, MD   250 mg at 10/03/20 0959   enoxaparin (LOVENOX) injection 40 mg  40 mg Subcutaneous Q24H Earlie Lou L, MD   40 mg at 10/03/20 0959   feeding supplement (BOOST / RESOURCE BREEZE) liquid 1 Container  1 Container Oral Q24H Kc, Dayna Barker, MD   1 Container at 10/02/20 1709   feeding supplement (ENSURE ENLIVE / ENSURE PLUS) liquid 237 mL  237 mL Oral Q24H Earlie Lou L, MD   237 mL at 10/03/20 0959   iron polysaccharides (NIFEREX) capsule 150 mg  150 mg Oral Daily Azu, Crystal, MD   150 mg at 10/03/20 0958   levalbuterol (XOPENEX) nebulizer solution 0.63 mg  0.63 mg Nebulization Q6H PRN Kc, Dayna Barker, MD       melatonin tablet  3 mg  3 mg Oral q1800 Azu, Crystal, MD   3 mg at 10/02/20 2242   multivitamin with minerals tablet 1 tablet  1 tablet Oral Daily Rometta Emery, MD   1 tablet at 10/03/20 0958   ondansetron (ZOFRAN) tablet 4 mg  4 mg Oral Q6H PRN Rometta Emery, MD       Or   ondansetron (ZOFRAN) injection 4 mg  4 mg Intravenous Q6H PRN Rometta Emery, MD   4 mg at 09/29/20 0318   sertraline (ZOLOFT) tablet 25 mg  25 mg Oral Daily Earlie Lou L, MD   25 mg at 10/03/20 9509   vitamin B-12 (CYANOCOBALAMIN) tablet 500 mcg  500 mcg Oral Daily Lanae Boast, MD   500 mcg at 10/03/20 3267     Discharge Medications: TAKE these medications     acetaminophen 325 MG tablet Commonly known as: TYLENOL Take 2 tablets (650 mg total) by mouth every 6 (six) hours as needed for mild pain (or Fever >/= 101).    amoxicillin 500 MG capsule Commonly known as: AMOXIL Take 1 capsule (500 mg total) by mouth every 8 (eight) hours for 5 doses.    ARIPiprazole 2 MG tablet Commonly known as: ABILIFY Take 2 mg by mouth daily.    conjugated estrogens vaginal cream Commonly known as: PREMARIN Place 1 Applicatorful vaginally daily.    divalproex 125 MG capsule Commonly known as: DEPAKOTE SPRINKLE Take 250 mg by mouth with breakfast, with lunch, and with evening meal.    feeding supplement Liqd Take 237 mLs by mouth daily.    iron polysaccharides 150 MG capsule Commonly known as: NIFEREX Take 1 capsule (150 mg total) by mouth daily. Start taking on: October 04, 2020    melatonin 3 MG Tabs tablet Take 1 tablet (3 mg total) by mouth daily at 6 PM. What changed: when to take this    multivitamin with minerals Tabs tablet Take 1 tablet by mouth daily.    ondansetron 4 MG tablet Commonly known as: ZOFRAN Take 1 tablet (4 mg total) by mouth every 8 (eight) hours as needed for nausea or vomiting.    senna 8.6 MG Tabs tablet Commonly known as: SENOKOT Take 1 tablet (8.6 mg total) by mouth daily as needed for mild  constipation (no bowel movement).    sertraline 25 MG tablet Commonly known as: ZOLOFT Take 25 mg by mouth daily.    Vitamin D3 1.25 MG (50000 UT) Caps Take 1 capsule 3  /week for Vitamin D Deficiency      Relevant Imaging Results:  Relevant Lab Results:   Additional Information SSN #: [id number]; Ht. 5'1"; Wt. 147lbs.  Roy Snuffer, Vinnie Langton, LCSW

## 2020-10-04 LAB — URINE CULTURE: Culture: >=100000 — AB

## 2020-10-05 ENCOUNTER — Telehealth: Payer: Self-pay

## 2020-10-05 NOTE — Telephone Encounter (Signed)
Called patient on 10/05/2020 , 2:15 PM in an attempt to reach the patient for a hospital follow up.   Admit date: 09/28/20 Discharge: 10/03/20   I spoke with her son [name]. Hospital follow up won't be needed as the patient is residing at [facility]. I also spoke with Efraim Kaufmann at [facility] and was informed that her medical care will be handled at their facility, they have a physician that will be seeing her.   Prior to Admission medications   Medication Sig Start Date End Date Taking? Authorizing Provider  acetaminophen (TYLENOL) 325 MG tablet Take 2 tablets (650 mg total) by mouth every 6 (six) hours as needed for mild pain (or Fever >/= 101). 10/03/20 11/02/20  Rachael Fee, MD  amoxicillin (AMOXIL) 500 MG capsule Take 1 capsule (500 mg total) by mouth every 8 (eight) hours for 5 doses. 10/03/20 10/05/20  Rachael Fee, MD  ARIPiprazole (ABILIFY) 2 MG tablet Take 2 mg by mouth daily. 07/31/20   [provider]  Cholecalciferol (VITAMIN D3) 1.25 MG (50000 UT) CAPS Take 1 capsule 3  /week for Vitamin D Deficiency 01/25/19   Lucky Cowboy, MD  conjugated estrogens (PREMARIN) vaginal cream Place 1 Applicatorful vaginally daily. 10/03/20 01/01/21  Rachael Fee, MD  divalproex (DEPAKOTE SPRINKLE) 125 MG capsule Take 250 mg by mouth with breakfast, with lunch, and with evening meal. 07/31/20   [provider]  feeding supplement (ENSURE ENLIVE / ENSURE PLUS) LIQD Take 237 mLs by mouth daily. 09/12/20   Swayze, Ava, DO  iron polysaccharides (NIFEREX) 150 MG capsule Take 1 capsule (150 mg total) by mouth daily. 10/04/20 11/03/20  Rachael Fee, MD  melatonin 3 MG TABS tablet Take 1 tablet (3 mg total) by mouth daily at 6 PM. 10/03/20 11/02/20  Rachael Fee, MD  Multiple Vitamin (MULTIVITAMIN WITH MINERALS) TABS tablet Take 1 tablet by mouth daily. 09/12/20   Swayze, Ava, DO  ondansetron (ZOFRAN) 4 MG tablet Take 1 tablet (4 mg total) by mouth every 8 (eight) hours as needed for nausea or vomiting.  09/12/20   Swayze, Ava, DO  senna (SENOKOT) 8.6 MG TABS tablet Take 1 tablet (8.6 mg total) by mouth daily as needed for mild constipation (no bowel movement). 10/03/20 11/02/20  Rachael Fee, MD  sertraline (ZOLOFT) 25 MG tablet Take 25 mg by mouth daily. 07/31/20   [provider]

## 2020-10-06 DIAGNOSIS — N390 Urinary tract infection, site not specified: Secondary | ICD-10-CM | POA: Diagnosis not present

## 2020-10-20 DIAGNOSIS — N390 Urinary tract infection, site not specified: Secondary | ICD-10-CM | POA: Diagnosis not present

## 2020-10-26 DIAGNOSIS — I7091 Generalized atherosclerosis: Secondary | ICD-10-CM | POA: Diagnosis not present

## 2020-10-26 DIAGNOSIS — L603 Nail dystrophy: Secondary | ICD-10-CM | POA: Diagnosis not present

## 2020-10-26 DIAGNOSIS — B351 Tinea unguium: Secondary | ICD-10-CM | POA: Diagnosis not present

## 2020-10-27 DIAGNOSIS — R5381 Other malaise: Secondary | ICD-10-CM | POA: Diagnosis not present

## 2020-11-29 ENCOUNTER — Ambulatory Visit: Payer: Medicare Other | Admitting: Adult Health

## 2020-12-29 ENCOUNTER — Emergency Department (HOSPITAL_COMMUNITY): Payer: Medicare Other

## 2020-12-29 ENCOUNTER — Other Ambulatory Visit: Payer: Self-pay

## 2020-12-29 ENCOUNTER — Encounter (HOSPITAL_COMMUNITY): Payer: Self-pay | Admitting: Emergency Medicine

## 2020-12-29 ENCOUNTER — Emergency Department (HOSPITAL_COMMUNITY)
Admission: EM | Admit: 2020-12-29 | Discharge: 2020-12-30 | Disposition: A | Discharge status: Home / Self Care | Payer: Medicare Other | Attending: Emergency Medicine | Admitting: Emergency Medicine

## 2020-12-29 DIAGNOSIS — G894 Chronic pain syndrome: Secondary | ICD-10-CM | POA: Diagnosis not present

## 2020-12-29 DIAGNOSIS — K449 Diaphragmatic hernia without obstruction or gangrene: Secondary | ICD-10-CM | POA: Diagnosis not present

## 2020-12-29 DIAGNOSIS — D649 Anemia, unspecified: Secondary | ICD-10-CM | POA: Insufficient documentation

## 2020-12-29 DIAGNOSIS — I129 Hypertensive chronic kidney disease with stage 1 through stage 4 chronic kidney disease, or unspecified chronic kidney disease: Secondary | ICD-10-CM | POA: Insufficient documentation

## 2020-12-29 DIAGNOSIS — R3912 Poor urinary stream: Secondary | ICD-10-CM | POA: Diagnosis not present

## 2020-12-29 DIAGNOSIS — R404 Transient alteration of awareness: Secondary | ICD-10-CM | POA: Diagnosis not present

## 2020-12-29 DIAGNOSIS — N183 Chronic kidney disease, stage 3 unspecified: Secondary | ICD-10-CM | POA: Diagnosis not present

## 2020-12-29 DIAGNOSIS — R21 Rash and other nonspecific skin eruption: Secondary | ICD-10-CM | POA: Diagnosis not present

## 2020-12-29 DIAGNOSIS — F0390 Unspecified dementia without behavioral disturbance: Secondary | ICD-10-CM | POA: Diagnosis not present

## 2020-12-29 DIAGNOSIS — Z20822 Contact with and (suspected) exposure to covid-19: Secondary | ICD-10-CM | POA: Insufficient documentation

## 2020-12-29 DIAGNOSIS — Z87891 Personal history of nicotine dependence: Secondary | ICD-10-CM | POA: Insufficient documentation

## 2020-12-29 DIAGNOSIS — M25551 Pain in right hip: Secondary | ICD-10-CM | POA: Insufficient documentation

## 2020-12-29 DIAGNOSIS — R509 Fever, unspecified: Secondary | ICD-10-CM | POA: Diagnosis not present

## 2020-12-29 DIAGNOSIS — R0689 Other abnormalities of breathing: Secondary | ICD-10-CM | POA: Diagnosis not present

## 2020-12-29 DIAGNOSIS — R6889 Other general symptoms and signs: Secondary | ICD-10-CM | POA: Diagnosis not present

## 2020-12-29 DIAGNOSIS — R296 Repeated falls: Secondary | ICD-10-CM | POA: Diagnosis not present

## 2020-12-29 DIAGNOSIS — Z743 Need for continuous supervision: Secondary | ICD-10-CM | POA: Diagnosis not present

## 2020-12-29 LAB — URINALYSIS, ROUTINE W REFLEX MICROSCOPIC
Bacteria, UA: NONE SEEN
Bilirubin Urine: NEGATIVE
Glucose, UA: NEGATIVE mg/dL
Ketones, ur: NEGATIVE mg/dL
Leukocytes,Ua: NEGATIVE
Nitrite: NEGATIVE
Protein, ur: NEGATIVE mg/dL
RBC / HPF: >50 RBC/hpf — ABNORMAL HIGH (ref 0–5)
Specific Gravity, Urine: 1.018 (ref 1.005–1.030)
pH: 5 (ref 5.0–8.0)

## 2020-12-29 LAB — BASIC METABOLIC PANEL
Anion gap: 7 (ref 5–15)
BUN: 25 mg/dL — ABNORMAL HIGH (ref 8–23)
CO2: 26 mmol/L (ref 22–32)
Calcium: 8.9 mg/dL (ref 8.9–10.3)
Chloride: 107 mmol/L (ref 98–111)
Creatinine, Ser: 0.73 mg/dL (ref 0.44–1.00)
GFR, Estimated: >60 mL/min (ref >60)
Glucose, Bld: 176 mg/dL — ABNORMAL HIGH (ref 70–99)
Potassium: 4 mmol/L (ref 3.5–5.1)
Sodium: 140 mmol/L (ref 135–145)

## 2020-12-29 LAB — CBC WITH DIFFERENTIAL/PLATELET
Abs Immature Granulocytes: 0.02 10*3/uL (ref 0.00–0.07)
Basophils Absolute: 0 10*3/uL (ref 0.0–0.1)
Basophils Relative: 0 %
Eosinophils Absolute: 0.1 10*3/uL (ref 0.0–0.5)
Eosinophils Relative: 1 %
HCT: 32.5 % — ABNORMAL LOW (ref 36.0–46.0)
Hemoglobin: 11 g/dL — ABNORMAL LOW (ref 12.0–15.0)
Immature Granulocytes: 0 %
Lymphocytes Relative: 34 %
Lymphs Abs: 2.5 10*3/uL (ref 0.7–4.0)
MCH: 27 pg (ref 26.0–34.0)
MCHC: 33.8 g/dL (ref 30.0–36.0)
MCV: 79.9 fL — ABNORMAL LOW (ref 80.0–100.0)
Monocytes Absolute: 0.5 10*3/uL (ref 0.1–1.0)
Monocytes Relative: 7 %
Neutro Abs: 4.2 10*3/uL (ref 1.7–7.7)
Neutrophils Relative %: 58 %
Platelets: 216 10*3/uL (ref 150–400)
RBC: 4.07 MIL/uL (ref 3.87–5.11)
RDW: 18 % — ABNORMAL HIGH (ref 11.5–15.5)
WBC: 7.3 10*3/uL (ref 4.0–10.5)
nRBC: 0 % (ref 0.0–0.2)

## 2020-12-29 MED ORDER — LORAZEPAM 2 MG/ML IJ SOLN
0.5000 mg | Freq: Once | INTRAMUSCULAR | Status: AC
  Administered 2020-12-29: 0.5 mg via INTRAVENOUS
  Filled 2020-12-29: qty 1

## 2020-12-29 NOTE — ED Provider Notes (Signed)
[facility]-EMERGENCY DEPT Provider Note   CSN: [id number] Arrival date & time: 12/29/20  2208     History Chief Complaint  Patient presents with   Fever    Kimberly Gregory is a 85 y.o. female.  Patient brought in by EMS from [facility] with reported fever.  Was reportedly febrile to 101 by EMS.  Reportedly, patient has had decreased urine output and has been complaining of right hip pain.  She is A&O x1 at baseline.  Level 5 caveat applies 2/2 dementia.  The history is provided by the patient. No language interpreter was used.      Past Medical History:  Diagnosis Date   Acute encephalopathy 06/13/2016   AKI (acute kidney injury) (HCC) 06/13/2016   Anemia 09/09/2020   Arthritis    Chronic kidney disease    Dementia (HCC)    Depression 09/11/2018   Gastroesophageal reflux disease without esophagitis 09/11/2018   Headache    Hypercholesterolemia    Hypertension    Ischemic colitis (HCC) 10/24/2016   Laxative abuse 10/21/2016   SIRS (systemic inflammatory response syndrome) (HCC) 10/21/2016   Vertigo 10/14/2014    Patient Active Problem List   Diagnosis Date Noted   Sepsis secondary to UTI (HCC) 09/28/2020   Aortic arch atherosclerosis (HCC) CXR 09/2020 09/17/2020   Sepsis (HCC) 09/09/2020   Urinary tract infection with hematuria 09/09/2020   Anemia 09/09/2020   Nausea and vomiting 09/09/2020   Hypotension 09/09/2020   CKD (chronic kidney disease) stage 3, GFR 30-59 ml/min (HCC) 09/09/2020   Visual hallucinations 08/18/2019   Gastroesophageal reflux disease without esophagitis 09/11/2018   Former smoker 09/11/2018   Vitamin D deficiency 09/11/2018   Depressive disorder in remission 09/11/2018   Dementia with behavioral disturbance 10/21/2016   Benign essential HTN 10/14/2014   Hyperlipidemia 10/14/2014    Past Surgical History:  Procedure Laterality Date   ABDOMINAL HYSTERECTOMY     BLADDER SURGERY     CATARACT EXTRACTION     CHOLECYSTECTOMY      COLONOSCOPY  2005   EYE SURGERY     KNEE SURGERY Right    arthroscopic     OB History   No obstetric history on file.     History reviewed. No pertinent family history.  Social History   Tobacco Use   Smoking status: Former    Packs/day: 0.50    Years: 55.00    Pack years: 27.50    Types: Cigarettes    Quit date: 07/12/2019    Years since quitting: 1.4   Smokeless tobacco: Never   Tobacco comments:    Stopped smoking May 2021 per family  Vaping Use   Vaping Use: Never used  Substance Use Topics   Alcohol use: No   Drug use: No    Home Medications Prior to Admission medications   Medication Sig Start Date End Date Taking? Authorizing Provider  ARIPiprazole (ABILIFY) 2 MG tablet Take 2 mg by mouth daily. 07/31/20   [provider]  Cholecalciferol (VITAMIN D3) 1.25 MG (50000 UT) CAPS Take 1 capsule 3  /week for Vitamin D Deficiency 01/25/19   Lucky Cowboy, MD  conjugated estrogens (PREMARIN) vaginal cream Place 1 Applicatorful vaginally daily. 10/03/20 01/01/21  Rachael Fee, MD  divalproex (DEPAKOTE SPRINKLE) 125 MG capsule Take 250 mg by mouth with breakfast, with lunch, and with evening meal. 07/31/20   [provider]  feeding supplement (ENSURE ENLIVE / ENSURE PLUS) LIQD Take 237 mLs by mouth daily. 09/12/20  Swayze, Ava, DO  iron polysaccharides (NIFEREX) 150 MG capsule Take 1 capsule (150 mg total) by mouth daily. 10/04/20 11/03/20  Rachael Fee, MD  Multiple Vitamin (MULTIVITAMIN WITH MINERALS) TABS tablet Take 1 tablet by mouth daily. 09/12/20   Swayze, Ava, DO  ondansetron (ZOFRAN) 4 MG tablet Take 1 tablet (4 mg total) by mouth every 8 (eight) hours as needed for nausea or vomiting. 09/12/20   Swayze, Ava, DO  sertraline (ZOLOFT) 25 MG tablet Take 25 mg by mouth daily. 07/31/20   [provider]    Allergies    Patient has no known allergies.  Review of Systems   Review of Systems  All other systems reviewed and are  negative.  Physical Exam Updated Vital Signs BP (!) 120/55   Pulse 85   Temp 98.4 F (36.9 C) (Oral)   Resp 20   Ht 5\' 1"  (1.549 m)   Wt 67 kg   SpO2 98%   BMI 27.91 kg/m   Physical Exam Vitals and nursing note reviewed.  Constitutional:      General: She is not in acute distress.    Appearance: She is well-developed.  HENT:     Head: Normocephalic and atraumatic.  Eyes:     Conjunctiva/sclera: Conjunctivae normal.  Cardiovascular:     Rate and Rhythm: Normal rate and regular rhythm.     Heart sounds: No murmur heard. Pulmonary:     Effort: Pulmonary effort is normal. No respiratory distress.     Breath sounds: Normal breath sounds.  Abdominal:     Palpations: Abdomen is soft.     Tenderness: There is no abdominal tenderness.  Musculoskeletal:        General: Normal range of motion.     Cervical back: Neck supple.  Skin:    General: Skin is warm and dry.  Neurological:     Mental Status: She is alert.     Comments: Alert to person  Psychiatric:        Mood and Affect: Mood normal.        Behavior: Behavior normal.    ED Results / Procedures / Treatments   Labs (all labs ordered are listed, but only abnormal results are displayed) Labs Reviewed  URINE CULTURE  RESP PANEL BY RT-PCR (FLU A&B, COVID) ARPGX2  URINALYSIS, ROUTINE W REFLEX MICROSCOPIC  BASIC METABOLIC PANEL  CBC WITH DIFFERENTIAL/PLATELET    EKG None  Radiology No results found.  Procedures Procedures   Medications Ordered in ED Medications - No data to display  ED Course  I have reviewed the triage vital signs and the nursing notes.  Pertinent labs & imaging results that were available during my care of the patient were reviewed by me and considered in my medical decision making (see chart for details).    MDM Rules/Calculators/A&P                           Patient here with reported decreased urine output from [facility] also with questionable fever.  Was reportedly  febrile to 101 by EMS, but she has not had a fever here.  She was never given any antipyretics.  Her work-up is reassuring.  Chest x-ray is negative.  Urinalysis shows red blood cells, but this is likely traumatic catheterization.  She is noted to be mildly anemic, but otherwise blood work is reassuring.  I do not think she requires any further emergent work-up.  She is stable  for discharge. Final Clinical Impression(s) / ED Diagnoses Final diagnoses:  Anemia, unspecified type    Rx / DC Orders ED Discharge Orders     None        Roxy Horseman, PA-C 12/30/20 3546    Jacalyn Lefevre, MD 01/01/21 1228

## 2020-12-29 NOTE — ED Triage Notes (Signed)
Pt arrived via EMS from [facility]. Pt developed a fever today per EMS, and has some redness in her face. Pt has been urinating less over the past week, and has been complaining of right hip pain. Pt is A&Ox1 at her baseline, and has not been more altered than normal. Pt has a DNR which is currently at bedside.

## 2020-12-30 DIAGNOSIS — Z7401 Bed confinement status: Secondary | ICD-10-CM | POA: Diagnosis not present

## 2020-12-30 DIAGNOSIS — R404 Transient alteration of awareness: Secondary | ICD-10-CM | POA: Diagnosis not present

## 2020-12-30 DIAGNOSIS — Z743 Need for continuous supervision: Secondary | ICD-10-CM | POA: Diagnosis not present

## 2020-12-30 LAB — RESP PANEL BY RT-PCR (FLU A&B, COVID) ARPGX2
Influenza A by PCR: NEGATIVE
Influenza B by PCR: NEGATIVE
SARS Coronavirus 2 by RT PCR: NEGATIVE

## 2020-12-30 NOTE — ED Notes (Signed)
[facility] called for patient 

## 2020-12-30 NOTE — Discharge Instructions (Addendum)
You did not have fever during your evaluation in the emergency department.  You were noted to be slightly anemic.  Otherwise, your blood work, urinalysis, and x-rays were reassuring.

## 2020-12-30 NOTE — ED Notes (Signed)
Attempted to call [facility], no answer.

## 2020-12-31 LAB — URINE CULTURE: Culture: NO GROWTH

## 2021-01-05 DIAGNOSIS — I10 Essential (primary) hypertension: Secondary | ICD-10-CM | POA: Diagnosis not present

## 2021-01-05 DIAGNOSIS — N189 Chronic kidney disease, unspecified: Secondary | ICD-10-CM | POA: Diagnosis not present

## 2021-01-05 DIAGNOSIS — D649 Anemia, unspecified: Secondary | ICD-10-CM | POA: Diagnosis not present

## 2021-01-05 DIAGNOSIS — Z9181 History of falling: Secondary | ICD-10-CM | POA: Diagnosis not present

## 2021-01-11 DIAGNOSIS — Z9181 History of falling: Secondary | ICD-10-CM | POA: Diagnosis not present

## 2021-01-11 DIAGNOSIS — I10 Essential (primary) hypertension: Secondary | ICD-10-CM | POA: Diagnosis not present

## 2021-01-12 DIAGNOSIS — Z9181 History of falling: Secondary | ICD-10-CM | POA: Diagnosis not present

## 2021-01-12 DIAGNOSIS — I10 Essential (primary) hypertension: Secondary | ICD-10-CM | POA: Diagnosis not present

## 2021-01-19 DIAGNOSIS — I10 Essential (primary) hypertension: Secondary | ICD-10-CM | POA: Diagnosis not present

## 2021-01-19 DIAGNOSIS — Z9181 History of falling: Secondary | ICD-10-CM | POA: Diagnosis not present

## 2021-01-24 DIAGNOSIS — N189 Chronic kidney disease, unspecified: Secondary | ICD-10-CM | POA: Diagnosis not present

## 2021-01-24 DIAGNOSIS — L603 Nail dystrophy: Secondary | ICD-10-CM | POA: Diagnosis not present

## 2021-01-24 DIAGNOSIS — Z9181 History of falling: Secondary | ICD-10-CM | POA: Diagnosis not present

## 2021-01-24 DIAGNOSIS — B351 Tinea unguium: Secondary | ICD-10-CM | POA: Diagnosis not present

## 2021-01-24 DIAGNOSIS — I129 Hypertensive chronic kidney disease with stage 1 through stage 4 chronic kidney disease, or unspecified chronic kidney disease: Secondary | ICD-10-CM | POA: Diagnosis not present

## 2021-01-24 DIAGNOSIS — I7091 Generalized atherosclerosis: Secondary | ICD-10-CM | POA: Diagnosis not present

## 2021-01-27 DIAGNOSIS — I129 Hypertensive chronic kidney disease with stage 1 through stage 4 chronic kidney disease, or unspecified chronic kidney disease: Secondary | ICD-10-CM | POA: Diagnosis not present

## 2021-01-27 DIAGNOSIS — N189 Chronic kidney disease, unspecified: Secondary | ICD-10-CM | POA: Diagnosis not present

## 2021-01-27 DIAGNOSIS — Z9181 History of falling: Secondary | ICD-10-CM | POA: Diagnosis not present

## 2021-02-02 DIAGNOSIS — N189 Chronic kidney disease, unspecified: Secondary | ICD-10-CM | POA: Diagnosis not present

## 2021-02-02 DIAGNOSIS — I129 Hypertensive chronic kidney disease with stage 1 through stage 4 chronic kidney disease, or unspecified chronic kidney disease: Secondary | ICD-10-CM | POA: Diagnosis not present

## 2021-02-02 DIAGNOSIS — Z9181 History of falling: Secondary | ICD-10-CM | POA: Diagnosis not present

## 2021-02-09 DIAGNOSIS — I10 Essential (primary) hypertension: Secondary | ICD-10-CM | POA: Diagnosis not present

## 2021-02-09 DIAGNOSIS — Z0000 Encounter for general adult medical examination without abnormal findings: Secondary | ICD-10-CM | POA: Diagnosis not present

## 2021-02-09 DIAGNOSIS — N189 Chronic kidney disease, unspecified: Secondary | ICD-10-CM | POA: Diagnosis not present

## 2021-02-09 DIAGNOSIS — D649 Anemia, unspecified: Secondary | ICD-10-CM | POA: Diagnosis not present

## 2021-02-09 DIAGNOSIS — E559 Vitamin D deficiency, unspecified: Secondary | ICD-10-CM | POA: Diagnosis not present

## 2021-02-09 DIAGNOSIS — F03911 Unspecified dementia, unspecified severity, with agitation: Secondary | ICD-10-CM | POA: Diagnosis not present

## 2021-02-09 DIAGNOSIS — K219 Gastro-esophageal reflux disease without esophagitis: Secondary | ICD-10-CM | POA: Diagnosis not present

## 2021-02-11 DIAGNOSIS — Z9181 History of falling: Secondary | ICD-10-CM | POA: Diagnosis not present

## 2021-02-11 DIAGNOSIS — N189 Chronic kidney disease, unspecified: Secondary | ICD-10-CM | POA: Diagnosis not present

## 2021-02-11 DIAGNOSIS — I129 Hypertensive chronic kidney disease with stage 1 through stage 4 chronic kidney disease, or unspecified chronic kidney disease: Secondary | ICD-10-CM | POA: Diagnosis not present

## 2021-02-16 DIAGNOSIS — I10 Essential (primary) hypertension: Secondary | ICD-10-CM | POA: Diagnosis not present

## 2021-02-16 DIAGNOSIS — N189 Chronic kidney disease, unspecified: Secondary | ICD-10-CM | POA: Diagnosis not present

## 2021-02-16 DIAGNOSIS — F03911 Unspecified dementia, unspecified severity, with agitation: Secondary | ICD-10-CM | POA: Diagnosis not present

## 2021-02-16 DIAGNOSIS — D649 Anemia, unspecified: Secondary | ICD-10-CM | POA: Diagnosis not present

## 2021-02-16 DIAGNOSIS — R059 Cough, unspecified: Secondary | ICD-10-CM | POA: Diagnosis not present

## 2021-02-16 DIAGNOSIS — R5381 Other malaise: Secondary | ICD-10-CM | POA: Diagnosis not present

## 2021-02-16 DIAGNOSIS — K219 Gastro-esophageal reflux disease without esophagitis: Secondary | ICD-10-CM | POA: Diagnosis not present

## 2021-02-16 DIAGNOSIS — R0981 Nasal congestion: Secondary | ICD-10-CM | POA: Diagnosis not present

## 2021-02-16 DIAGNOSIS — E559 Vitamin D deficiency, unspecified: Secondary | ICD-10-CM | POA: Diagnosis not present

## 2021-02-18 DIAGNOSIS — Z9181 History of falling: Secondary | ICD-10-CM | POA: Diagnosis not present

## 2021-02-18 DIAGNOSIS — I129 Hypertensive chronic kidney disease with stage 1 through stage 4 chronic kidney disease, or unspecified chronic kidney disease: Secondary | ICD-10-CM | POA: Diagnosis not present

## 2021-02-18 DIAGNOSIS — N189 Chronic kidney disease, unspecified: Secondary | ICD-10-CM | POA: Diagnosis not present

## 2021-02-22 DIAGNOSIS — I10 Essential (primary) hypertension: Secondary | ICD-10-CM | POA: Diagnosis not present

## 2021-02-22 DIAGNOSIS — N189 Chronic kidney disease, unspecified: Secondary | ICD-10-CM | POA: Diagnosis not present

## 2021-02-22 DIAGNOSIS — E559 Vitamin D deficiency, unspecified: Secondary | ICD-10-CM | POA: Diagnosis not present

## 2021-02-22 DIAGNOSIS — E119 Type 2 diabetes mellitus without complications: Secondary | ICD-10-CM | POA: Diagnosis not present

## 2021-02-23 DIAGNOSIS — I129 Hypertensive chronic kidney disease with stage 1 through stage 4 chronic kidney disease, or unspecified chronic kidney disease: Secondary | ICD-10-CM | POA: Diagnosis not present

## 2021-02-23 DIAGNOSIS — Z9181 History of falling: Secondary | ICD-10-CM | POA: Diagnosis not present

## 2021-02-23 DIAGNOSIS — N189 Chronic kidney disease, unspecified: Secondary | ICD-10-CM | POA: Diagnosis not present

## 2021-02-25 DIAGNOSIS — N189 Chronic kidney disease, unspecified: Secondary | ICD-10-CM | POA: Diagnosis not present

## 2021-02-25 DIAGNOSIS — Z9181 History of falling: Secondary | ICD-10-CM | POA: Diagnosis not present

## 2021-02-25 DIAGNOSIS — I129 Hypertensive chronic kidney disease with stage 1 through stage 4 chronic kidney disease, or unspecified chronic kidney disease: Secondary | ICD-10-CM | POA: Diagnosis not present

## 2021-03-04 DIAGNOSIS — Z9181 History of falling: Secondary | ICD-10-CM | POA: Diagnosis not present

## 2021-03-04 DIAGNOSIS — I129 Hypertensive chronic kidney disease with stage 1 through stage 4 chronic kidney disease, or unspecified chronic kidney disease: Secondary | ICD-10-CM | POA: Diagnosis not present

## 2021-03-04 DIAGNOSIS — N189 Chronic kidney disease, unspecified: Secondary | ICD-10-CM | POA: Diagnosis not present

## 2021-03-28 DIAGNOSIS — B351 Tinea unguium: Secondary | ICD-10-CM | POA: Diagnosis not present

## 2021-03-28 DIAGNOSIS — I7091 Generalized atherosclerosis: Secondary | ICD-10-CM | POA: Diagnosis not present

## 2021-03-28 DIAGNOSIS — L603 Nail dystrophy: Secondary | ICD-10-CM | POA: Diagnosis not present

## 2021-05-25 DIAGNOSIS — N390 Urinary tract infection, site not specified: Secondary | ICD-10-CM | POA: Diagnosis not present

## 2021-05-25 DIAGNOSIS — N189 Chronic kidney disease, unspecified: Secondary | ICD-10-CM | POA: Diagnosis not present

## 2021-05-25 DIAGNOSIS — B372 Candidiasis of skin and nail: Secondary | ICD-10-CM | POA: Diagnosis not present

## 2021-05-25 DIAGNOSIS — I10 Essential (primary) hypertension: Secondary | ICD-10-CM | POA: Diagnosis not present

## 2021-05-30 DIAGNOSIS — I7091 Generalized atherosclerosis: Secondary | ICD-10-CM | POA: Diagnosis not present

## 2021-05-30 DIAGNOSIS — L603 Nail dystrophy: Secondary | ICD-10-CM | POA: Diagnosis not present

## 2021-05-30 DIAGNOSIS — B351 Tinea unguium: Secondary | ICD-10-CM | POA: Diagnosis not present

## 2021-06-29 DIAGNOSIS — S40029A Contusion of unspecified upper arm, initial encounter: Secondary | ICD-10-CM | POA: Diagnosis not present

## 2021-06-29 DIAGNOSIS — N189 Chronic kidney disease, unspecified: Secondary | ICD-10-CM | POA: Diagnosis not present

## 2021-06-29 DIAGNOSIS — B372 Candidiasis of skin and nail: Secondary | ICD-10-CM | POA: Diagnosis not present

## 2021-07-06 DIAGNOSIS — B372 Candidiasis of skin and nail: Secondary | ICD-10-CM | POA: Diagnosis not present

## 2021-07-06 DIAGNOSIS — N189 Chronic kidney disease, unspecified: Secondary | ICD-10-CM | POA: Diagnosis not present

## 2021-07-06 DIAGNOSIS — I10 Essential (primary) hypertension: Secondary | ICD-10-CM | POA: Diagnosis not present

## 2021-07-27 DIAGNOSIS — N189 Chronic kidney disease, unspecified: Secondary | ICD-10-CM | POA: Diagnosis not present

## 2021-07-27 DIAGNOSIS — I10 Essential (primary) hypertension: Secondary | ICD-10-CM | POA: Diagnosis not present

## 2021-07-28 ENCOUNTER — Emergency Department (HOSPITAL_COMMUNITY)
Admission: EM | Admit: 2021-07-28 | Discharge: 2021-07-28 | Disposition: A | Discharge status: Home / Self Care | Payer: Medicare Other | Attending: Emergency Medicine | Admitting: Emergency Medicine

## 2021-07-28 ENCOUNTER — Emergency Department (HOSPITAL_COMMUNITY): Payer: Medicare Other

## 2021-07-28 DIAGNOSIS — N3000 Acute cystitis without hematuria: Secondary | ICD-10-CM | POA: Diagnosis not present

## 2021-07-28 DIAGNOSIS — F0390 Unspecified dementia without behavioral disturbance: Secondary | ICD-10-CM | POA: Insufficient documentation

## 2021-07-28 DIAGNOSIS — R531 Weakness: Secondary | ICD-10-CM | POA: Diagnosis not present

## 2021-07-28 DIAGNOSIS — R410 Disorientation, unspecified: Secondary | ICD-10-CM | POA: Diagnosis not present

## 2021-07-28 DIAGNOSIS — R6889 Other general symptoms and signs: Secondary | ICD-10-CM | POA: Diagnosis not present

## 2021-07-28 DIAGNOSIS — R4182 Altered mental status, unspecified: Secondary | ICD-10-CM | POA: Diagnosis present

## 2021-07-28 DIAGNOSIS — I499 Cardiac arrhythmia, unspecified: Secondary | ICD-10-CM | POA: Diagnosis not present

## 2021-07-28 DIAGNOSIS — Z743 Need for continuous supervision: Secondary | ICD-10-CM | POA: Diagnosis not present

## 2021-07-28 LAB — CBC WITH DIFFERENTIAL/PLATELET
Abs Immature Granulocytes: 0.02 K/uL (ref 0.00–0.07)
Basophils Absolute: 0 K/uL (ref 0.0–0.1)
Basophils Relative: 1 %
Eosinophils Absolute: 0 K/uL (ref 0.0–0.5)
Eosinophils Relative: 0 %
HCT: 36.1 % (ref 36.0–46.0)
Hemoglobin: 12.9 g/dL (ref 12.0–15.0)
Immature Granulocytes: 0 %
Lymphocytes Relative: 42 %
Lymphs Abs: 3.1 K/uL (ref 0.7–4.0)
MCH: 30.9 pg (ref 26.0–34.0)
MCHC: 35.7 g/dL (ref 30.0–36.0)
MCV: 86.4 fL (ref 80.0–100.0)
Monocytes Absolute: 1.2 K/uL — ABNORMAL HIGH (ref 0.1–1.0)
Monocytes Relative: 17 %
Neutro Abs: 2.9 K/uL (ref 1.7–7.7)
Neutrophils Relative %: 40 %
Platelets: 173 K/uL (ref 150–400)
RBC: 4.18 MIL/uL (ref 3.87–5.11)
RDW: 14.5 % (ref 11.5–15.5)
WBC: 7.3 K/uL (ref 4.0–10.5)
nRBC: 0 % (ref 0.0–0.2)

## 2021-07-28 LAB — URINALYSIS, ROUTINE W REFLEX MICROSCOPIC
Bilirubin Urine: NEGATIVE
Glucose, UA: NEGATIVE mg/dL
Ketones, ur: 5 mg/dL — AB
Nitrite: POSITIVE — AB
Protein, ur: NEGATIVE mg/dL
Specific Gravity, Urine: 1.016 (ref 1.005–1.030)
pH: 7 (ref 5.0–8.0)

## 2021-07-28 LAB — COMPREHENSIVE METABOLIC PANEL WITH GFR
ALT: 19 U/L (ref 0–44)
AST: 34 U/L (ref 15–41)
Albumin: 3.1 g/dL — ABNORMAL LOW (ref 3.5–5.0)
Alkaline Phosphatase: 51 U/L (ref 38–126)
Anion gap: 6 (ref 5–15)
BUN: 25 mg/dL — ABNORMAL HIGH (ref 8–23)
CO2: 28 mmol/L (ref 22–32)
Calcium: 8.9 mg/dL (ref 8.9–10.3)
Chloride: 107 mmol/L (ref 98–111)
Creatinine, Ser: 1.09 mg/dL — ABNORMAL HIGH (ref 0.44–1.00)
GFR, Estimated: 50 mL/min — ABNORMAL LOW
Glucose, Bld: 88 mg/dL (ref 70–99)
Potassium: 4.7 mmol/L (ref 3.5–5.1)
Sodium: 141 mmol/L (ref 135–145)
Total Bilirubin: 0.6 mg/dL (ref 0.3–1.2)
Total Protein: 7.2 g/dL (ref 6.5–8.1)

## 2021-07-28 MED ORDER — CEPHALEXIN 500 MG PO CAPS: 500.0000 mg | ORAL_CAPSULE | Freq: Four times a day (QID) | ORAL | 0 refills | Status: DC

## 2021-07-28 MED ORDER — SODIUM CHLORIDE 0.9 % IV BOLUS
500.0000 mL | Freq: Once | INTRAVENOUS | Status: AC
  Administered 2021-07-28: 500 mL via INTRAVENOUS

## 2021-07-28 NOTE — Discharge Instructions (Addendum)
Urine culture has been sent and you will be notified if it does not match up with the coverage that she has been given.

## 2021-07-28 NOTE — ED Notes (Signed)
Pt has on a purewick for urine

## 2021-07-28 NOTE — ED Triage Notes (Signed)
Pt arrives via [facility] from [place] home for altered mental status. EMS reported that pt's family visited four days ago and felt like she is more altered from baseline. Pt has hx of dementia and is usually friendly, but has been more aggressive recently (unable to correlate with staff). Pt able to state her name, but otherwise non-verbal. EMS reports that pt had periods of bigeminy during transport. DNR at bedside.

## 2021-07-28 NOTE — ED Provider Notes (Signed)
[facility] DEPT Provider Note   CSN: [id number] Arrival date & time: 07/28/21  1056     History  Chief Complaint  Patient presents with   Altered Mental Status    Kimberly Gregory is a 86 y.o. female.   Altered Mental Status Patient brought in by family for mental status change.  Eating and drinking less.  Has baseline dementia but has been more out of it.  Reportedly more normal 4 days ago.  Lives at a memory care unit.  Patient is without complaints.    Home Medications Prior to Admission medications   Medication Sig Start Date End Date Taking? Authorizing Provider  cephALEXin (KEFLEX) 500 MG capsule Take 1 capsule (500 mg total) by mouth 4 (four) times daily. 07/28/21  Yes Davonna Belling, MD  ARIPiprazole (ABILIFY) 2 MG tablet Take 2 mg by mouth daily. 07/31/20   [provider]  Cholecalciferol (VITAMIN D3) 1.25 MG (50000 UT) CAPS Take 1 capsule 3  /week for Vitamin D Deficiency 01/25/19   Unk Pinto, MD  divalproex (DEPAKOTE SPRINKLE) 125 MG capsule Take 250 mg by mouth with breakfast, with lunch, and with evening meal. 07/31/20   [provider]  feeding supplement (ENSURE ENLIVE / ENSURE PLUS) LIQD Take 237 mLs by mouth daily. 09/12/20   Swayze, Ava, DO  iron polysaccharides (NIFEREX) 150 MG capsule Take 1 capsule (150 mg total) by mouth daily. 10/04/20 11/03/20  Cecille Rubin, MD  Multiple Vitamin (MULTIVITAMIN WITH MINERALS) TABS tablet Take 1 tablet by mouth daily. 09/12/20   Swayze, Ava, DO  ondansetron (ZOFRAN) 4 MG tablet Take 1 tablet (4 mg total) by mouth every 8 (eight) hours as needed for nausea or vomiting. 09/12/20   Swayze, Ava, DO  sertraline (ZOLOFT) 25 MG tablet Take 25 mg by mouth daily. 07/31/20   [provider]      Allergies    Patient has no known allergies.    Review of Systems   Review of Systems  Physical Exam Updated Vital Signs BP (!) 145/57   Pulse 68   Temp 98.7 F (37.1 C) (Rectal)    Resp 16   SpO2 97%  Physical Exam Vitals and nursing note reviewed.  HENT:     Head: Normocephalic.  Pulmonary:     Breath sounds: No wheezing or rhonchi.  Abdominal:     Tenderness: There is no abdominal tenderness.  Musculoskeletal:        General: No tenderness.     Cervical back: Neck supple.  Neurological:     Mental Status: She is alert.     Comments: Reportedly more confused than baseline.    ED Results / Procedures / Treatments   Labs (all labs ordered are listed, but only abnormal results are displayed) Labs Reviewed  COMPREHENSIVE METABOLIC PANEL - Abnormal; Notable for the following components:      Result Value   BUN 25 (*)    Creatinine, Ser 1.09 (*)    Albumin 3.1 (*)    GFR, Estimated 50 (*)    All other components within normal limits  CBC WITH DIFFERENTIAL/PLATELET - Abnormal; Notable for the following components:   Monocytes Absolute 1.2 (*)    All other components within normal limits  URINALYSIS, ROUTINE W REFLEX MICROSCOPIC - Abnormal; Notable for the following components:   APPearance CLOUDY (*)    Hgb urine dipstick MODERATE (*)    Ketones, ur 5 (*)    Nitrite POSITIVE (*)  Leukocytes,Ua SMALL (*)    Bacteria, UA MANY (*)    All other components within normal limits  URINE CULTURE    EKG None  Radiology DG Chest Portable 1 View  Result Date: 07/28/2021 CLINICAL DATA:  Altered mental status EXAM: PORTABLE CHEST 1 VIEW COMPARISON:  12/29/2020 FINDINGS: Patient is slightly rotated. The heart size and mediastinal contours are within normal limits. Both lungs are clear. The visualized skeletal structures are unremarkable. IMPRESSION: No active disease. Electronically Signed   By: Davina Poke D.O.   On: 07/28/2021 13:03    Procedures Procedures    Medications Ordered in ED Medications  sodium chloride 0.9 % bolus 500 mL (0 mLs Intravenous Stopped 07/28/21 1514)    ED Course/ Medical Decision Making/ A&P                            Medical Decision Making Amount and/or Complexity of Data Reviewed Labs: ordered. Radiology: ordered.  Risk Prescription drug management.   Patient mental status change.  Broad differential diagnosis for an elderly with mental status change but does include dehydration UTI, medication effects.  Creatinine just mildly elevated.  May be a component of dehydration some fluid bolus was given.  Patient reportedly perked up and feeling somewhat better.  However urinalysis shows likely infection.  Culture sent.  Will treat with Keflex.  Discussed with the patient's family members they feel that she could manage this at the memory care unit she is at.  They should be able to get her to eat and take her medicines.  Appears stable for discharge home.        Final Clinical Impression(s) / ED Diagnoses Final diagnoses:  Acute cystitis without hematuria    Rx / DC Orders ED Discharge Orders          Ordered    cephALEXin (KEFLEX) 500 MG capsule  4 times daily        07/28/21 1458              Davonna Belling, MD 07/28/21 1520

## 2021-07-31 LAB — URINE CULTURE: Culture: >=100000 — AB

## 2021-08-01 ENCOUNTER — Telehealth: Payer: Self-pay | Admitting: Emergency Medicine

## 2021-08-01 NOTE — Telephone Encounter (Signed)
Post ED Visit - Positive Culture Follow-up  Culture report reviewed by antimicrobial stewardship pharmacist: [facility] []  , Pharm.D. []  Enzo Bi, Pharm.D., BCPS AQ-ID []  , Pharm.D., BCPS [x]  Celedonio Miyamoto, Pharm.D., BCPS []  [place], Garvin Fila.D., BCPS, AAHIVP []  , Pharm.D., BCPS, AAHIVP []  Georgina Pillion, PharmD, BCPS []  , PharmD, BCPS []  [place], PharmD, BCPS []  [place], PharmD []  , PharmD, BCPS []  Estella Husk, PharmD  Pharmacy Team []  Lysle Pearl, PharmD []  , PharmD []  Phillips Climes, PharmD []  , Rph []  Agapito Games) , PharmD []  Verlan Friends, PharmD []  , PharmD []  Mervyn Gay, PharmD []  , PharmD []  Vinnie Level, PharmD []  [facility], PharmD []  , PharmD []  Len Childs, PharmD   Positive urine culture Treated with cephalexin, organism sensitive to the same and no further patient follow-up is required at this time.  08/01/2021, 9:18 AM

## 2021-08-03 DIAGNOSIS — N390 Urinary tract infection, site not specified: Secondary | ICD-10-CM | POA: Diagnosis not present

## 2021-08-03 DIAGNOSIS — N189 Chronic kidney disease, unspecified: Secondary | ICD-10-CM | POA: Diagnosis not present

## 2021-08-03 DIAGNOSIS — I10 Essential (primary) hypertension: Secondary | ICD-10-CM | POA: Diagnosis not present

## 2021-08-19 DIAGNOSIS — E119 Type 2 diabetes mellitus without complications: Secondary | ICD-10-CM | POA: Diagnosis not present

## 2021-08-19 DIAGNOSIS — N189 Chronic kidney disease, unspecified: Secondary | ICD-10-CM | POA: Diagnosis not present

## 2021-08-19 DIAGNOSIS — I10 Essential (primary) hypertension: Secondary | ICD-10-CM | POA: Diagnosis not present

## 2021-08-19 DIAGNOSIS — D649 Anemia, unspecified: Secondary | ICD-10-CM | POA: Diagnosis not present

## 2021-08-19 DIAGNOSIS — E559 Vitamin D deficiency, unspecified: Secondary | ICD-10-CM | POA: Diagnosis not present

## 2021-08-24 DIAGNOSIS — G308 Other Alzheimer's disease: Secondary | ICD-10-CM | POA: Diagnosis not present

## 2021-08-24 DIAGNOSIS — D649 Anemia, unspecified: Secondary | ICD-10-CM | POA: Diagnosis not present

## 2021-08-24 DIAGNOSIS — N189 Chronic kidney disease, unspecified: Secondary | ICD-10-CM | POA: Diagnosis not present

## 2021-08-24 DIAGNOSIS — E559 Vitamin D deficiency, unspecified: Secondary | ICD-10-CM | POA: Diagnosis not present

## 2021-08-30 DIAGNOSIS — I7091 Generalized atherosclerosis: Secondary | ICD-10-CM | POA: Diagnosis not present

## 2021-08-30 DIAGNOSIS — B351 Tinea unguium: Secondary | ICD-10-CM | POA: Diagnosis not present

## 2021-08-31 DIAGNOSIS — R2681 Unsteadiness on feet: Secondary | ICD-10-CM | POA: Diagnosis not present

## 2021-08-31 DIAGNOSIS — I10 Essential (primary) hypertension: Secondary | ICD-10-CM | POA: Diagnosis not present

## 2021-08-31 DIAGNOSIS — R451 Restlessness and agitation: Secondary | ICD-10-CM | POA: Diagnosis not present

## 2021-09-07 DIAGNOSIS — N390 Urinary tract infection, site not specified: Secondary | ICD-10-CM | POA: Diagnosis not present

## 2021-09-21 DIAGNOSIS — I10 Essential (primary) hypertension: Secondary | ICD-10-CM | POA: Diagnosis not present

## 2021-09-21 DIAGNOSIS — R5381 Other malaise: Secondary | ICD-10-CM | POA: Diagnosis not present

## 2021-09-21 DIAGNOSIS — N189 Chronic kidney disease, unspecified: Secondary | ICD-10-CM | POA: Diagnosis not present

## 2021-09-21 DIAGNOSIS — E559 Vitamin D deficiency, unspecified: Secondary | ICD-10-CM | POA: Diagnosis not present

## 2021-10-05 DIAGNOSIS — I10 Essential (primary) hypertension: Secondary | ICD-10-CM | POA: Diagnosis not present

## 2021-10-05 DIAGNOSIS — N189 Chronic kidney disease, unspecified: Secondary | ICD-10-CM | POA: Diagnosis not present

## 2021-10-05 DIAGNOSIS — R2681 Unsteadiness on feet: Secondary | ICD-10-CM | POA: Diagnosis not present

## 2021-10-11 DIAGNOSIS — R269 Unspecified abnormalities of gait and mobility: Secondary | ICD-10-CM | POA: Diagnosis not present

## 2021-10-11 DIAGNOSIS — I10 Essential (primary) hypertension: Secondary | ICD-10-CM | POA: Diagnosis not present

## 2021-10-11 DIAGNOSIS — N189 Chronic kidney disease, unspecified: Secondary | ICD-10-CM | POA: Diagnosis not present

## 2021-10-11 DIAGNOSIS — R2681 Unsteadiness on feet: Secondary | ICD-10-CM | POA: Diagnosis not present

## 2021-11-22 DIAGNOSIS — R451 Restlessness and agitation: Secondary | ICD-10-CM | POA: Diagnosis not present

## 2021-11-22 DIAGNOSIS — S5011XD Contusion of right forearm, subsequent encounter: Secondary | ICD-10-CM | POA: Diagnosis not present

## 2021-11-26 ENCOUNTER — Emergency Department (HOSPITAL_COMMUNITY): Payer: Medicare Other

## 2021-11-26 ENCOUNTER — Emergency Department (HOSPITAL_COMMUNITY)
Admission: EM | Admit: 2021-11-26 | Discharge: 2021-11-26 | Disposition: A | Discharge status: Skilled Nursing Facility | Payer: Medicare Other | Attending: Emergency Medicine | Admitting: Emergency Medicine

## 2021-11-26 ENCOUNTER — Other Ambulatory Visit: Payer: Self-pay

## 2021-11-26 DIAGNOSIS — F0390 Unspecified dementia without behavioral disturbance: Secondary | ICD-10-CM | POA: Insufficient documentation

## 2021-11-26 DIAGNOSIS — Z23 Encounter for immunization: Secondary | ICD-10-CM | POA: Insufficient documentation

## 2021-11-26 DIAGNOSIS — S199XXA Unspecified injury of neck, initial encounter: Secondary | ICD-10-CM | POA: Diagnosis not present

## 2021-11-26 DIAGNOSIS — W08XXXA Fall from other furniture, initial encounter: Secondary | ICD-10-CM | POA: Insufficient documentation

## 2021-11-26 DIAGNOSIS — S01111A Laceration without foreign body of right eyelid and periocular area, initial encounter: Secondary | ICD-10-CM | POA: Insufficient documentation

## 2021-11-26 DIAGNOSIS — S0990XA Unspecified injury of head, initial encounter: Secondary | ICD-10-CM | POA: Diagnosis present

## 2021-11-26 DIAGNOSIS — W19XXXA Unspecified fall, initial encounter: Secondary | ICD-10-CM

## 2021-11-26 DIAGNOSIS — R7309 Other abnormal glucose: Secondary | ICD-10-CM | POA: Insufficient documentation

## 2021-11-26 DIAGNOSIS — Z043 Encounter for examination and observation following other accident: Secondary | ICD-10-CM | POA: Diagnosis not present

## 2021-11-26 DIAGNOSIS — S0181XA Laceration without foreign body of other part of head, initial encounter: Secondary | ICD-10-CM

## 2021-11-26 DIAGNOSIS — I10 Essential (primary) hypertension: Secondary | ICD-10-CM | POA: Diagnosis not present

## 2021-11-26 DIAGNOSIS — R0902 Hypoxemia: Secondary | ICD-10-CM | POA: Diagnosis not present

## 2021-11-26 DIAGNOSIS — Z743 Need for continuous supervision: Secondary | ICD-10-CM | POA: Diagnosis not present

## 2021-11-26 LAB — CBG MONITORING, ED: Glucose-Capillary: 97 mg/dL (ref 70–99)

## 2021-11-26 MED ORDER — LIDOCAINE HCL 2 % IJ SOLN
10.0000 mL | Freq: Once | INTRAMUSCULAR | Status: AC
  Administered 2021-11-26: 200 mg
  Filled 2021-11-26: qty 20

## 2021-11-26 MED ORDER — TETANUS-DIPHTH-ACELL PERTUSSIS 5-2.5-18.5 LF-MCG/0.5 IM SUSY
0.5000 mL | PREFILLED_SYRINGE | Freq: Once | INTRAMUSCULAR | Status: AC
  Administered 2021-11-26: 0.5 mL via INTRAMUSCULAR
  Filled 2021-11-26: qty 0.5

## 2021-11-26 MED ORDER — LORAZEPAM 2 MG/ML IJ SOLN
1.0000 mg | Freq: Once | INTRAMUSCULAR | Status: AC
  Administered 2021-11-26: 1 mg via INTRAMUSCULAR
  Filled 2021-11-26: qty 1

## 2021-11-26 NOTE — ED Provider Notes (Signed)
LACERATION REPAIR Performed by: Teola Bradley Authorized by: Teola Bradley Consent: Verbal consent obtained. Risks and benefits: risks, benefits and alternatives were discussed Consent given by: patient Patient identity confirmed: provided demographic data Prepped and Draped in normal sterile fashion Wound explored  Laceration Location: Right eye brow   Laceration Length: 2cm  No Foreign Bodies seen or palpated  Anesthesia: local infiltration  Local anesthetic: lidocaine 2% without epinephrine  Anesthetic total: 2 ml  Irrigation method: syringe Amount of cleaning: standard  Skin closure:Suture repair  Number of sutures: 4  Technique: Simple Interrupted sutures   Patient tolerance: Patient tolerated the procedure well with no immediate complications.     Teola Bradley, MD 11/26/21 1913    Drenda Freeze, MD 11/26/21 2207

## 2021-11-26 NOTE — ED Provider Notes (Signed)
[facility]-EMERGENCY DEPT Provider Note   CSN: [id number] Arrival date & time: 11/26/21  1711     History  Chief Complaint  Patient presents with   Kimberly Gregory is a 86 y.o. female history of dementia, here presenting with fall.  Patient is from [facility].  Patient had a witnessed fall off of a couch and landed on the right side of her scalp.  Patient was noted to have small eyebrow laceration.  Patient demented unable to give much history.  Vital stable per EMS.  The history is provided by the EMS personnel.   Level V caveat- dementia      Home Medications Prior to Admission medications   Medication Sig Start Date End Date Taking? Authorizing Provider  ARIPiprazole (ABILIFY) 2 MG tablet Take 2 mg by mouth daily. 07/31/20   [provider]  cephALEXin (KEFLEX) 500 MG capsule Take 1 capsule (500 mg total) by mouth 4 (four) times daily. 07/28/21   Benjiman Core, MD  Cholecalciferol (VITAMIN D3) 1.25 MG (50000 UT) CAPS Take 1 capsule 3  /week for Vitamin D Deficiency 01/25/19   Lucky Cowboy, MD  divalproex (DEPAKOTE SPRINKLE) 125 MG capsule Take 250 mg by mouth with breakfast, with lunch, and with evening meal. 07/31/20   [provider]  feeding supplement (ENSURE ENLIVE / ENSURE PLUS) LIQD Take 237 mLs by mouth daily. 09/12/20   Swayze, Ava, DO  iron polysaccharides (NIFEREX) 150 MG capsule Take 1 capsule (150 mg total) by mouth daily. 10/04/20 11/03/20  Rachael Fee, MD  Multiple Vitamin (MULTIVITAMIN WITH MINERALS) TABS tablet Take 1 tablet by mouth daily. 09/12/20   Swayze, Ava, DO  ondansetron (ZOFRAN) 4 MG tablet Take 1 tablet (4 mg total) by mouth every 8 (eight) hours as needed for nausea or vomiting. 09/12/20   Swayze, Ava, DO  sertraline (ZOLOFT) 25 MG tablet Take 25 mg by mouth daily. 07/31/20   [provider]      Allergies    Patient has no known allergies.    Review of Systems   Review of Systems  Skin:   Positive for wound.  Psychiatric/Behavioral:  Positive for confusion.   All other systems reviewed and are negative.   Physical Exam Updated Vital Signs BP (!) 147/76   Pulse 96   Temp (!) 97.4 F (36.3 C) (Axillary)   Resp (!) 24   SpO2 100%  Physical Exam Vitals and nursing note reviewed.  Constitutional:      Comments: Confused and demented  HENT:     Head: Normocephalic.     Comments: 2 cm laceration above the right eyebrow    Nose: Nose normal.     Mouth/Throat:     Mouth: Mucous membranes are moist.  Eyes:     Extraocular Movements: Extraocular movements intact.     Pupils: Pupils are equal, round, and reactive to light.  Cardiovascular:     Rate and Rhythm: Normal rate and regular rhythm.     Pulses: Normal pulses.     Heart sounds: Normal heart sounds.  Pulmonary:     Effort: Pulmonary effort is normal.     Breath sounds: Normal breath sounds.  Abdominal:     General: Abdomen is flat.     Palpations: Abdomen is soft.  Musculoskeletal:        General: Normal range of motion.     Cervical back: Normal range of motion and neck supple.  Comments: Pelvis is stable and normal range of motion of bilateral hips  Skin:    General: Skin is warm.  Neurological:     Comments: Demented and nonverbal.  Patient is contracted as well but moving all extremities  Psychiatric:     Comments: Unable     ED Results / Procedures / Treatments   Labs (all labs ordered are listed, but only abnormal results are displayed) Labs Reviewed  CBG MONITORING, ED    EKG EKG Interpretation  Date/Time:  Saturday November 26 2021 17:43:50 EDT Ventricular Rate:  75 PR Interval:  141 QRS Duration: 94 QT Interval:  375 QTC Calculation: 419 R Axis:   -47 Text Interpretation: Sinus arrhythmia Ventricular bigeminy LAD, consider left anterior fascicular block PVC new since previous Confirmed by Wandra Arthurs [id number]) on 11/26/2021 5:51:27 PM  Radiology CT HEAD WO CONTRAST  (5MM)  Result Date: 11/26/2021 CLINICAL DATA:  Head trauma, intracranial arterial injury suspected; Neck trauma (Age >= 65y). Pt had unwitnessed fall, hit forehead, laeration Very confused triced to strap her down multiple times/ bedt possible scan EXAM: CT HEAD WITHOUT CONTRAST CT CERVICAL SPINE WITHOUT CONTRAST TECHNIQUE: Multidetector CT imaging of the head and cervical spine was performed following the standard protocol without intravenous contrast. Multiplanar CT image reconstructions of the cervical spine were also generated. RADIATION DOSE REDUCTION: This exam was performed according to the departmental dose-optimization program which includes automated exposure control, adjustment of the mA and/or kV according to patient size and/or use of iterative reconstruction technique. COMPARISON:  MRI head 11/23/2015, CT head and C-spine 11/23/2015 FINDINGS: CT HEAD FINDINGS BRAIN: BRAIN Cerebral ventricle sizes are concordant with the degree of cerebral volume loss. Patchy and confluent areas of decreased attenuation are noted throughout the deep and periventricular white matter of the cerebral hemispheres bilaterally, compatible with chronic microvascular ischemic disease. No evidence of large-territorial acute infarction. No parenchymal hemorrhage. No mass lesion. No extra-axial collection. No mass effect or midline shift. No hydrocephalus. Basilar cisterns are patent. Vascular: No hyperdense vessel. Skull: Limited evaluation of the skull base to motion artifact. No calvarial fracture. Sinuses/Orbits: Paranasal sinuses and mastoid air cells are clear. The orbits are unremarkable. Other: None. CT CERVICAL SPINE FINDINGS Nondiagnostic due to marked motion artifact. Airspace opacity noted within the right apex. Pleural/pulmonary scarring in the apices. Centrilobular emphysematous changes. IMPRESSION: 1. No acute intracranial abnormality. 2. Nondiagnostic CT C-spine due to motion artifact. Recommend repeat. 3.  Emphysema (ICD10-J43.9). Electronically Signed   By: Iven Finn M.D.   On: 11/26/2021 18:21   CT Cervical Spine Wo Contrast  Result Date: 11/26/2021 CLINICAL DATA:  Head trauma, intracranial arterial injury suspected; Neck trauma (Age >= 65y). Pt had unwitnessed fall, hit forehead, laeration Very confused triced to strap her down multiple times/ bedt possible scan EXAM: CT HEAD WITHOUT CONTRAST CT CERVICAL SPINE WITHOUT CONTRAST TECHNIQUE: Multidetector CT imaging of the head and cervical spine was performed following the standard protocol without intravenous contrast. Multiplanar CT image reconstructions of the cervical spine were also generated. RADIATION DOSE REDUCTION: This exam was performed according to the departmental dose-optimization program which includes automated exposure control, adjustment of the mA and/or kV according to patient size and/or use of iterative reconstruction technique. COMPARISON:  MRI head 11/23/2015, CT head and C-spine 11/23/2015 FINDINGS: CT HEAD FINDINGS BRAIN: BRAIN Cerebral ventricle sizes are concordant with the degree of cerebral volume loss. Patchy and confluent areas of decreased attenuation are noted throughout the deep and periventricular white matter of the cerebral  hemispheres bilaterally, compatible with chronic microvascular ischemic disease. No evidence of large-territorial acute infarction. No parenchymal hemorrhage. No mass lesion. No extra-axial collection. No mass effect or midline shift. No hydrocephalus. Basilar cisterns are patent. Vascular: No hyperdense vessel. Skull: Limited evaluation of the skull base to motion artifact. No calvarial fracture. Sinuses/Orbits: Paranasal sinuses and mastoid air cells are clear. The orbits are unremarkable. Other: None. CT CERVICAL SPINE FINDINGS Nondiagnostic due to marked motion artifact. Airspace opacity noted within the right apex. Pleural/pulmonary scarring in the apices. Centrilobular emphysematous changes.  IMPRESSION: 1. No acute intracranial abnormality. 2. Nondiagnostic CT C-spine due to motion artifact. Recommend repeat. 3. Emphysema (ICD10-J43.9). Electronically Signed   By: Tish Frederickson M.D.   On: 11/26/2021 18:21   DG Pelvis 1-2 Views  Result Date: 11/26/2021 CLINICAL DATA:  fall EXAM: PELVIS - 1-2 VIEW COMPARISON:  CT abdomen pelvis 10/21/2016 FINDINGS: There is no evidence of pelvic fracture or diastasis. No acute displaced fracture or dislocation of either hips on frontal view. Moderate degenerative changes of the right hip. No pelvic bone lesions are seen. Degenerative changes visualized within the lower lumbar spine. IMPRESSION: Negative for acute traumatic injury. Electronically Signed   By: Tish Frederickson M.D.   On: 11/26/2021 17:46   DG Chest 1 View  Result Date: 11/26/2021 CLINICAL DATA:  fall EXAM: CHEST  1 VIEW COMPARISON:  Chest x-ray 07/28/2021 FINDINGS: The heart and mediastinal contours are within normal limits. Limited evaluation of the apices due to overlying mandible. No focal consolidation. Chronic coarsened interstitial markings with no overt pulmonary edema. No pleural effusion. No pneumothorax. No acute osseous abnormality. IMPRESSION: 1. No active disease. 2. Limited evaluation of the apices due to overlying mandible. Electronically Signed   By: Tish Frederickson M.D.   On: 11/26/2021 17:44    Procedures Procedures    Medications Ordered in ED Medications  lidocaine (XYLOCAINE) 2 % (with pres) injection 200 mg (200 mg Infiltration Given by Other 11/26/21 1733)  LORazepam (ATIVAN) injection 1 mg (1 mg Intramuscular Given 11/26/21 1746)  Tdap (BOOSTRIX) injection 0.5 mL (0.5 mLs Intramuscular Given 11/26/21 1745)    ED Course/ Medical Decision Making/ A&P                           Medical Decision Making JESSAMINE BARCIA is a 86 y.o. female here presenting with fall.  Patient had a witnessed fall off of the couch.  Patient hit the right eyebrow area has a small  laceration there.  We will get CT head and cervical spine and chest and pelvis x-rays.  I do not see any other external signs of trauma other than the small eyebrow laceration.  Patient is not on blood thinners.  7:00 PM I reviewed patient's imaging study.  CT head and cervical spine unremarkable.  X-rays unremarkable.  Laceration sutured by the resident under my supervision.  Suture removal in a week  Problems Addressed: Facial laceration, initial encounter: acute illness or injury Fall, initial encounter: acute illness or injury  Amount and/or Complexity of Data Reviewed Radiology: ordered and independent interpretation performed. Decision-making details documented in ED Course.  Risk Prescription drug management.    Final Clinical Impression(s) / ED Diagnoses Final diagnoses:  None    Rx / DC Orders ED Discharge Orders     None         Charlynne Pander, MD 11/26/21 1901

## 2021-11-26 NOTE — Discharge Instructions (Signed)
Fall precautions.  Suture removal in a week  See your doctor for follow-up   Return to ER if she has another fall, altered mental status

## 2021-11-26 NOTE — ED Triage Notes (Signed)
Pt arrives via [facility] from [facility] for a witnessed fall from standing. Pt reportedly fell straight forward onto hardwood floors and has a laceration above R eye. Pt has hx of dementia and was combative with EMS initially.

## 2021-11-28 DIAGNOSIS — G301 Alzheimer's disease with late onset: Secondary | ICD-10-CM | POA: Diagnosis not present

## 2021-11-28 DIAGNOSIS — S0181XD Laceration without foreign body of other part of head, subsequent encounter: Secondary | ICD-10-CM | POA: Diagnosis not present

## 2021-11-28 DIAGNOSIS — I10 Essential (primary) hypertension: Secondary | ICD-10-CM | POA: Diagnosis not present

## 2021-11-28 DIAGNOSIS — R296 Repeated falls: Secondary | ICD-10-CM | POA: Diagnosis not present

## 2021-11-28 DIAGNOSIS — R451 Restlessness and agitation: Secondary | ICD-10-CM | POA: Diagnosis not present

## 2021-12-05 DIAGNOSIS — Z4800 Encounter for change or removal of nonsurgical wound dressing: Secondary | ICD-10-CM | POA: Diagnosis not present

## 2021-12-05 DIAGNOSIS — G301 Alzheimer's disease with late onset: Secondary | ICD-10-CM | POA: Diagnosis not present

## 2021-12-05 DIAGNOSIS — I10 Essential (primary) hypertension: Secondary | ICD-10-CM | POA: Diagnosis not present

## 2021-12-05 DIAGNOSIS — S0181XD Laceration without foreign body of other part of head, subsequent encounter: Secondary | ICD-10-CM | POA: Diagnosis not present

## 2022-01-23 DIAGNOSIS — R296 Repeated falls: Secondary | ICD-10-CM | POA: Diagnosis not present

## 2022-01-23 DIAGNOSIS — G301 Alzheimer's disease with late onset: Secondary | ICD-10-CM | POA: Diagnosis not present

## 2022-01-23 DIAGNOSIS — R2681 Unsteadiness on feet: Secondary | ICD-10-CM | POA: Diagnosis not present

## 2022-01-23 DIAGNOSIS — R269 Unspecified abnormalities of gait and mobility: Secondary | ICD-10-CM | POA: Diagnosis not present

## 2022-06-05 DIAGNOSIS — R609 Edema, unspecified: Secondary | ICD-10-CM | POA: Diagnosis not present

## 2022-06-05 DIAGNOSIS — H02844 Edema of left upper eyelid: Secondary | ICD-10-CM | POA: Diagnosis not present

## 2022-06-05 DIAGNOSIS — F0390 Unspecified dementia without behavioral disturbance: Secondary | ICD-10-CM | POA: Diagnosis not present

## 2022-06-05 DIAGNOSIS — I10 Essential (primary) hypertension: Secondary | ICD-10-CM | POA: Diagnosis not present

## 2022-06-18 DIAGNOSIS — G4700 Insomnia, unspecified: Secondary | ICD-10-CM | POA: Diagnosis not present

## 2022-06-18 DIAGNOSIS — R42 Dizziness and giddiness: Secondary | ICD-10-CM | POA: Diagnosis not present

## 2022-06-18 DIAGNOSIS — G301 Alzheimer's disease with late onset: Secondary | ICD-10-CM | POA: Diagnosis not present

## 2022-06-18 DIAGNOSIS — M1990 Unspecified osteoarthritis, unspecified site: Secondary | ICD-10-CM | POA: Diagnosis not present

## 2022-06-18 DIAGNOSIS — K219 Gastro-esophageal reflux disease without esophagitis: Secondary | ICD-10-CM | POA: Diagnosis not present

## 2022-06-18 DIAGNOSIS — E559 Vitamin D deficiency, unspecified: Secondary | ICD-10-CM | POA: Diagnosis not present

## 2022-06-18 DIAGNOSIS — Z9181 History of falling: Secondary | ICD-10-CM | POA: Diagnosis not present

## 2022-06-18 DIAGNOSIS — B372 Candidiasis of skin and nail: Secondary | ICD-10-CM | POA: Diagnosis not present

## 2022-06-18 DIAGNOSIS — I129 Hypertensive chronic kidney disease with stage 1 through stage 4 chronic kidney disease, or unspecified chronic kidney disease: Secondary | ICD-10-CM | POA: Diagnosis not present

## 2022-06-18 DIAGNOSIS — F0283 Dementia in other diseases classified elsewhere, unspecified severity, with mood disturbance: Secondary | ICD-10-CM | POA: Diagnosis not present

## 2022-06-18 DIAGNOSIS — N189 Chronic kidney disease, unspecified: Secondary | ICD-10-CM | POA: Diagnosis not present

## 2022-06-18 DIAGNOSIS — G894 Chronic pain syndrome: Secondary | ICD-10-CM | POA: Diagnosis not present

## 2022-06-18 DIAGNOSIS — D631 Anemia in chronic kidney disease: Secondary | ICD-10-CM | POA: Diagnosis not present

## 2022-06-18 DIAGNOSIS — Z792 Long term (current) use of antibiotics: Secondary | ICD-10-CM | POA: Diagnosis not present

## 2022-06-18 DIAGNOSIS — N390 Urinary tract infection, site not specified: Secondary | ICD-10-CM | POA: Diagnosis not present

## 2022-06-26 DIAGNOSIS — I129 Hypertensive chronic kidney disease with stage 1 through stage 4 chronic kidney disease, or unspecified chronic kidney disease: Secondary | ICD-10-CM | POA: Diagnosis not present

## 2022-06-26 DIAGNOSIS — M1990 Unspecified osteoarthritis, unspecified site: Secondary | ICD-10-CM | POA: Diagnosis not present

## 2022-06-26 DIAGNOSIS — R42 Dizziness and giddiness: Secondary | ICD-10-CM | POA: Diagnosis not present

## 2022-06-26 DIAGNOSIS — G301 Alzheimer's disease with late onset: Secondary | ICD-10-CM | POA: Diagnosis not present

## 2022-06-26 DIAGNOSIS — K219 Gastro-esophageal reflux disease without esophagitis: Secondary | ICD-10-CM | POA: Diagnosis not present

## 2022-06-26 DIAGNOSIS — E559 Vitamin D deficiency, unspecified: Secondary | ICD-10-CM | POA: Diagnosis not present

## 2022-06-26 DIAGNOSIS — Z792 Long term (current) use of antibiotics: Secondary | ICD-10-CM | POA: Diagnosis not present

## 2022-06-26 DIAGNOSIS — N390 Urinary tract infection, site not specified: Secondary | ICD-10-CM | POA: Diagnosis not present

## 2022-06-26 DIAGNOSIS — D631 Anemia in chronic kidney disease: Secondary | ICD-10-CM | POA: Diagnosis not present

## 2022-06-26 DIAGNOSIS — G4700 Insomnia, unspecified: Secondary | ICD-10-CM | POA: Diagnosis not present

## 2022-06-26 DIAGNOSIS — Z9181 History of falling: Secondary | ICD-10-CM | POA: Diagnosis not present

## 2022-06-26 DIAGNOSIS — N189 Chronic kidney disease, unspecified: Secondary | ICD-10-CM | POA: Diagnosis not present

## 2022-06-26 DIAGNOSIS — B372 Candidiasis of skin and nail: Secondary | ICD-10-CM | POA: Diagnosis not present

## 2022-06-26 DIAGNOSIS — G894 Chronic pain syndrome: Secondary | ICD-10-CM | POA: Diagnosis not present

## 2022-06-26 DIAGNOSIS — F0283 Dementia in other diseases classified elsewhere, unspecified severity, with mood disturbance: Secondary | ICD-10-CM | POA: Diagnosis not present

## 2022-06-30 DIAGNOSIS — I129 Hypertensive chronic kidney disease with stage 1 through stage 4 chronic kidney disease, or unspecified chronic kidney disease: Secondary | ICD-10-CM | POA: Diagnosis not present

## 2022-06-30 DIAGNOSIS — B372 Candidiasis of skin and nail: Secondary | ICD-10-CM | POA: Diagnosis not present

## 2022-06-30 DIAGNOSIS — N390 Urinary tract infection, site not specified: Secondary | ICD-10-CM | POA: Diagnosis not present

## 2022-06-30 DIAGNOSIS — G4700 Insomnia, unspecified: Secondary | ICD-10-CM | POA: Diagnosis not present

## 2022-06-30 DIAGNOSIS — M1990 Unspecified osteoarthritis, unspecified site: Secondary | ICD-10-CM | POA: Diagnosis not present

## 2022-06-30 DIAGNOSIS — E559 Vitamin D deficiency, unspecified: Secondary | ICD-10-CM | POA: Diagnosis not present

## 2022-06-30 DIAGNOSIS — Z792 Long term (current) use of antibiotics: Secondary | ICD-10-CM | POA: Diagnosis not present

## 2022-06-30 DIAGNOSIS — K219 Gastro-esophageal reflux disease without esophagitis: Secondary | ICD-10-CM | POA: Diagnosis not present

## 2022-06-30 DIAGNOSIS — G301 Alzheimer's disease with late onset: Secondary | ICD-10-CM | POA: Diagnosis not present

## 2022-06-30 DIAGNOSIS — D631 Anemia in chronic kidney disease: Secondary | ICD-10-CM | POA: Diagnosis not present

## 2022-06-30 DIAGNOSIS — N189 Chronic kidney disease, unspecified: Secondary | ICD-10-CM | POA: Diagnosis not present

## 2022-06-30 DIAGNOSIS — Z9181 History of falling: Secondary | ICD-10-CM | POA: Diagnosis not present

## 2022-06-30 DIAGNOSIS — R42 Dizziness and giddiness: Secondary | ICD-10-CM | POA: Diagnosis not present

## 2022-06-30 DIAGNOSIS — G894 Chronic pain syndrome: Secondary | ICD-10-CM | POA: Diagnosis not present

## 2022-06-30 DIAGNOSIS — F0283 Dementia in other diseases classified elsewhere, unspecified severity, with mood disturbance: Secondary | ICD-10-CM | POA: Diagnosis not present

## 2022-07-05 DIAGNOSIS — I129 Hypertensive chronic kidney disease with stage 1 through stage 4 chronic kidney disease, or unspecified chronic kidney disease: Secondary | ICD-10-CM | POA: Diagnosis not present

## 2022-07-05 DIAGNOSIS — G301 Alzheimer's disease with late onset: Secondary | ICD-10-CM | POA: Diagnosis not present

## 2022-07-05 DIAGNOSIS — M1990 Unspecified osteoarthritis, unspecified site: Secondary | ICD-10-CM | POA: Diagnosis not present

## 2022-07-05 DIAGNOSIS — K219 Gastro-esophageal reflux disease without esophagitis: Secondary | ICD-10-CM | POA: Diagnosis not present

## 2022-07-05 DIAGNOSIS — Z9181 History of falling: Secondary | ICD-10-CM | POA: Diagnosis not present

## 2022-07-05 DIAGNOSIS — G4700 Insomnia, unspecified: Secondary | ICD-10-CM | POA: Diagnosis not present

## 2022-07-05 DIAGNOSIS — F0283 Dementia in other diseases classified elsewhere, unspecified severity, with mood disturbance: Secondary | ICD-10-CM | POA: Diagnosis not present

## 2022-07-05 DIAGNOSIS — Z792 Long term (current) use of antibiotics: Secondary | ICD-10-CM | POA: Diagnosis not present

## 2022-07-05 DIAGNOSIS — G894 Chronic pain syndrome: Secondary | ICD-10-CM | POA: Diagnosis not present

## 2022-07-05 DIAGNOSIS — R42 Dizziness and giddiness: Secondary | ICD-10-CM | POA: Diagnosis not present

## 2022-07-05 DIAGNOSIS — N390 Urinary tract infection, site not specified: Secondary | ICD-10-CM | POA: Diagnosis not present

## 2022-07-05 DIAGNOSIS — E559 Vitamin D deficiency, unspecified: Secondary | ICD-10-CM | POA: Diagnosis not present

## 2022-07-05 DIAGNOSIS — B372 Candidiasis of skin and nail: Secondary | ICD-10-CM | POA: Diagnosis not present

## 2022-07-05 DIAGNOSIS — D631 Anemia in chronic kidney disease: Secondary | ICD-10-CM | POA: Diagnosis not present

## 2022-07-05 DIAGNOSIS — N189 Chronic kidney disease, unspecified: Secondary | ICD-10-CM | POA: Diagnosis not present

## 2022-07-14 DIAGNOSIS — E559 Vitamin D deficiency, unspecified: Secondary | ICD-10-CM | POA: Diagnosis not present

## 2022-07-14 DIAGNOSIS — R42 Dizziness and giddiness: Secondary | ICD-10-CM | POA: Diagnosis not present

## 2022-07-14 DIAGNOSIS — D631 Anemia in chronic kidney disease: Secondary | ICD-10-CM | POA: Diagnosis not present

## 2022-07-14 DIAGNOSIS — F0283 Dementia in other diseases classified elsewhere, unspecified severity, with mood disturbance: Secondary | ICD-10-CM | POA: Diagnosis not present

## 2022-07-14 DIAGNOSIS — Z9181 History of falling: Secondary | ICD-10-CM | POA: Diagnosis not present

## 2022-07-14 DIAGNOSIS — G301 Alzheimer's disease with late onset: Secondary | ICD-10-CM | POA: Diagnosis not present

## 2022-07-14 DIAGNOSIS — G4700 Insomnia, unspecified: Secondary | ICD-10-CM | POA: Diagnosis not present

## 2022-07-14 DIAGNOSIS — I129 Hypertensive chronic kidney disease with stage 1 through stage 4 chronic kidney disease, or unspecified chronic kidney disease: Secondary | ICD-10-CM | POA: Diagnosis not present

## 2022-07-14 DIAGNOSIS — M1990 Unspecified osteoarthritis, unspecified site: Secondary | ICD-10-CM | POA: Diagnosis not present

## 2022-07-14 DIAGNOSIS — K219 Gastro-esophageal reflux disease without esophagitis: Secondary | ICD-10-CM | POA: Diagnosis not present

## 2022-07-14 DIAGNOSIS — B372 Candidiasis of skin and nail: Secondary | ICD-10-CM | POA: Diagnosis not present

## 2022-07-14 DIAGNOSIS — N390 Urinary tract infection, site not specified: Secondary | ICD-10-CM | POA: Diagnosis not present

## 2022-07-14 DIAGNOSIS — Z792 Long term (current) use of antibiotics: Secondary | ICD-10-CM | POA: Diagnosis not present

## 2022-07-14 DIAGNOSIS — N189 Chronic kidney disease, unspecified: Secondary | ICD-10-CM | POA: Diagnosis not present

## 2022-07-14 DIAGNOSIS — G894 Chronic pain syndrome: Secondary | ICD-10-CM | POA: Diagnosis not present

## 2022-07-17 DIAGNOSIS — D649 Anemia, unspecified: Secondary | ICD-10-CM | POA: Diagnosis not present

## 2022-07-17 DIAGNOSIS — I10 Essential (primary) hypertension: Secondary | ICD-10-CM | POA: Diagnosis not present

## 2022-07-17 DIAGNOSIS — N189 Chronic kidney disease, unspecified: Secondary | ICD-10-CM | POA: Diagnosis not present

## 2022-07-17 DIAGNOSIS — Z0000 Encounter for general adult medical examination without abnormal findings: Secondary | ICD-10-CM | POA: Diagnosis not present

## 2022-07-17 DIAGNOSIS — E559 Vitamin D deficiency, unspecified: Secondary | ICD-10-CM | POA: Diagnosis not present

## 2022-07-18 DIAGNOSIS — E559 Vitamin D deficiency, unspecified: Secondary | ICD-10-CM | POA: Diagnosis not present

## 2022-07-18 DIAGNOSIS — K219 Gastro-esophageal reflux disease without esophagitis: Secondary | ICD-10-CM | POA: Diagnosis not present

## 2022-07-18 DIAGNOSIS — I129 Hypertensive chronic kidney disease with stage 1 through stage 4 chronic kidney disease, or unspecified chronic kidney disease: Secondary | ICD-10-CM | POA: Diagnosis not present

## 2022-07-18 DIAGNOSIS — D631 Anemia in chronic kidney disease: Secondary | ICD-10-CM | POA: Diagnosis not present

## 2022-07-18 DIAGNOSIS — M1990 Unspecified osteoarthritis, unspecified site: Secondary | ICD-10-CM | POA: Diagnosis not present

## 2022-07-18 DIAGNOSIS — Z792 Long term (current) use of antibiotics: Secondary | ICD-10-CM | POA: Diagnosis not present

## 2022-07-18 DIAGNOSIS — B372 Candidiasis of skin and nail: Secondary | ICD-10-CM | POA: Diagnosis not present

## 2022-07-18 DIAGNOSIS — Z9181 History of falling: Secondary | ICD-10-CM | POA: Diagnosis not present

## 2022-07-18 DIAGNOSIS — G4700 Insomnia, unspecified: Secondary | ICD-10-CM | POA: Diagnosis not present

## 2022-07-18 DIAGNOSIS — G894 Chronic pain syndrome: Secondary | ICD-10-CM | POA: Diagnosis not present

## 2022-07-18 DIAGNOSIS — N390 Urinary tract infection, site not specified: Secondary | ICD-10-CM | POA: Diagnosis not present

## 2022-07-18 DIAGNOSIS — N189 Chronic kidney disease, unspecified: Secondary | ICD-10-CM | POA: Diagnosis not present

## 2022-07-18 DIAGNOSIS — R42 Dizziness and giddiness: Secondary | ICD-10-CM | POA: Diagnosis not present

## 2022-07-18 DIAGNOSIS — F0283 Dementia in other diseases classified elsewhere, unspecified severity, with mood disturbance: Secondary | ICD-10-CM | POA: Diagnosis not present

## 2022-07-18 DIAGNOSIS — G301 Alzheimer's disease with late onset: Secondary | ICD-10-CM | POA: Diagnosis not present

## 2022-07-19 DIAGNOSIS — K219 Gastro-esophageal reflux disease without esophagitis: Secondary | ICD-10-CM | POA: Diagnosis not present

## 2022-07-19 DIAGNOSIS — R42 Dizziness and giddiness: Secondary | ICD-10-CM | POA: Diagnosis not present

## 2022-07-19 DIAGNOSIS — G301 Alzheimer's disease with late onset: Secondary | ICD-10-CM | POA: Diagnosis not present

## 2022-07-19 DIAGNOSIS — E559 Vitamin D deficiency, unspecified: Secondary | ICD-10-CM | POA: Diagnosis not present

## 2022-07-19 DIAGNOSIS — D631 Anemia in chronic kidney disease: Secondary | ICD-10-CM | POA: Diagnosis not present

## 2022-07-19 DIAGNOSIS — I129 Hypertensive chronic kidney disease with stage 1 through stage 4 chronic kidney disease, or unspecified chronic kidney disease: Secondary | ICD-10-CM | POA: Diagnosis not present

## 2022-07-19 DIAGNOSIS — G4700 Insomnia, unspecified: Secondary | ICD-10-CM | POA: Diagnosis not present

## 2022-07-19 DIAGNOSIS — Z79899 Other long term (current) drug therapy: Secondary | ICD-10-CM | POA: Diagnosis not present

## 2022-07-19 DIAGNOSIS — M1990 Unspecified osteoarthritis, unspecified site: Secondary | ICD-10-CM | POA: Diagnosis not present

## 2022-07-19 DIAGNOSIS — N189 Chronic kidney disease, unspecified: Secondary | ICD-10-CM | POA: Diagnosis not present

## 2022-07-19 DIAGNOSIS — E785 Hyperlipidemia, unspecified: Secondary | ICD-10-CM | POA: Diagnosis not present

## 2022-07-19 DIAGNOSIS — B372 Candidiasis of skin and nail: Secondary | ICD-10-CM | POA: Diagnosis not present

## 2022-07-19 DIAGNOSIS — Z9181 History of falling: Secondary | ICD-10-CM | POA: Diagnosis not present

## 2022-07-19 DIAGNOSIS — F0283 Dementia in other diseases classified elsewhere, unspecified severity, with mood disturbance: Secondary | ICD-10-CM | POA: Diagnosis not present

## 2022-07-19 DIAGNOSIS — I10 Essential (primary) hypertension: Secondary | ICD-10-CM | POA: Diagnosis not present

## 2022-07-19 DIAGNOSIS — N390 Urinary tract infection, site not specified: Secondary | ICD-10-CM | POA: Diagnosis not present

## 2022-07-19 DIAGNOSIS — G894 Chronic pain syndrome: Secondary | ICD-10-CM | POA: Diagnosis not present

## 2022-07-19 DIAGNOSIS — Z792 Long term (current) use of antibiotics: Secondary | ICD-10-CM | POA: Diagnosis not present

## 2022-07-21 DIAGNOSIS — E559 Vitamin D deficiency, unspecified: Secondary | ICD-10-CM | POA: Diagnosis not present

## 2022-07-21 DIAGNOSIS — R42 Dizziness and giddiness: Secondary | ICD-10-CM | POA: Diagnosis not present

## 2022-07-21 DIAGNOSIS — F0283 Dementia in other diseases classified elsewhere, unspecified severity, with mood disturbance: Secondary | ICD-10-CM | POA: Diagnosis not present

## 2022-07-21 DIAGNOSIS — G894 Chronic pain syndrome: Secondary | ICD-10-CM | POA: Diagnosis not present

## 2022-07-21 DIAGNOSIS — I129 Hypertensive chronic kidney disease with stage 1 through stage 4 chronic kidney disease, or unspecified chronic kidney disease: Secondary | ICD-10-CM | POA: Diagnosis not present

## 2022-07-21 DIAGNOSIS — G301 Alzheimer's disease with late onset: Secondary | ICD-10-CM | POA: Diagnosis not present

## 2022-07-21 DIAGNOSIS — N189 Chronic kidney disease, unspecified: Secondary | ICD-10-CM | POA: Diagnosis not present

## 2022-07-21 DIAGNOSIS — K219 Gastro-esophageal reflux disease without esophagitis: Secondary | ICD-10-CM | POA: Diagnosis not present

## 2022-07-21 DIAGNOSIS — D631 Anemia in chronic kidney disease: Secondary | ICD-10-CM | POA: Diagnosis not present

## 2022-07-21 DIAGNOSIS — Z792 Long term (current) use of antibiotics: Secondary | ICD-10-CM | POA: Diagnosis not present

## 2022-07-21 DIAGNOSIS — N390 Urinary tract infection, site not specified: Secondary | ICD-10-CM | POA: Diagnosis not present

## 2022-07-21 DIAGNOSIS — M1990 Unspecified osteoarthritis, unspecified site: Secondary | ICD-10-CM | POA: Diagnosis not present

## 2022-07-21 DIAGNOSIS — G4700 Insomnia, unspecified: Secondary | ICD-10-CM | POA: Diagnosis not present

## 2022-07-21 DIAGNOSIS — Z9181 History of falling: Secondary | ICD-10-CM | POA: Diagnosis not present

## 2022-07-21 DIAGNOSIS — B372 Candidiasis of skin and nail: Secondary | ICD-10-CM | POA: Diagnosis not present

## 2022-07-24 DIAGNOSIS — N189 Chronic kidney disease, unspecified: Secondary | ICD-10-CM | POA: Diagnosis not present

## 2022-07-24 DIAGNOSIS — D649 Anemia, unspecified: Secondary | ICD-10-CM | POA: Diagnosis not present

## 2022-07-24 DIAGNOSIS — F0390 Unspecified dementia without behavioral disturbance: Secondary | ICD-10-CM | POA: Diagnosis not present

## 2022-07-26 DIAGNOSIS — G4700 Insomnia, unspecified: Secondary | ICD-10-CM | POA: Diagnosis not present

## 2022-07-26 DIAGNOSIS — E559 Vitamin D deficiency, unspecified: Secondary | ICD-10-CM | POA: Diagnosis not present

## 2022-07-26 DIAGNOSIS — D631 Anemia in chronic kidney disease: Secondary | ICD-10-CM | POA: Diagnosis not present

## 2022-07-26 DIAGNOSIS — R42 Dizziness and giddiness: Secondary | ICD-10-CM | POA: Diagnosis not present

## 2022-07-26 DIAGNOSIS — F0283 Dementia in other diseases classified elsewhere, unspecified severity, with mood disturbance: Secondary | ICD-10-CM | POA: Diagnosis not present

## 2022-07-26 DIAGNOSIS — M1990 Unspecified osteoarthritis, unspecified site: Secondary | ICD-10-CM | POA: Diagnosis not present

## 2022-07-26 DIAGNOSIS — Z9181 History of falling: Secondary | ICD-10-CM | POA: Diagnosis not present

## 2022-07-26 DIAGNOSIS — K219 Gastro-esophageal reflux disease without esophagitis: Secondary | ICD-10-CM | POA: Diagnosis not present

## 2022-07-26 DIAGNOSIS — B372 Candidiasis of skin and nail: Secondary | ICD-10-CM | POA: Diagnosis not present

## 2022-07-26 DIAGNOSIS — I129 Hypertensive chronic kidney disease with stage 1 through stage 4 chronic kidney disease, or unspecified chronic kidney disease: Secondary | ICD-10-CM | POA: Diagnosis not present

## 2022-07-26 DIAGNOSIS — G894 Chronic pain syndrome: Secondary | ICD-10-CM | POA: Diagnosis not present

## 2022-07-26 DIAGNOSIS — G301 Alzheimer's disease with late onset: Secondary | ICD-10-CM | POA: Diagnosis not present

## 2022-07-26 DIAGNOSIS — N189 Chronic kidney disease, unspecified: Secondary | ICD-10-CM | POA: Diagnosis not present

## 2022-07-26 DIAGNOSIS — N390 Urinary tract infection, site not specified: Secondary | ICD-10-CM | POA: Diagnosis not present

## 2022-07-26 DIAGNOSIS — Z792 Long term (current) use of antibiotics: Secondary | ICD-10-CM | POA: Diagnosis not present

## 2022-07-31 DIAGNOSIS — F01B4 Vascular dementia, moderate, with anxiety: Secondary | ICD-10-CM | POA: Diagnosis not present

## 2022-07-31 DIAGNOSIS — E559 Vitamin D deficiency, unspecified: Secondary | ICD-10-CM | POA: Diagnosis not present

## 2022-07-31 DIAGNOSIS — I10 Essential (primary) hypertension: Secondary | ICD-10-CM | POA: Diagnosis not present

## 2022-08-01 DIAGNOSIS — G4700 Insomnia, unspecified: Secondary | ICD-10-CM | POA: Diagnosis not present

## 2022-08-01 DIAGNOSIS — D631 Anemia in chronic kidney disease: Secondary | ICD-10-CM | POA: Diagnosis not present

## 2022-08-01 DIAGNOSIS — F0283 Dementia in other diseases classified elsewhere, unspecified severity, with mood disturbance: Secondary | ICD-10-CM | POA: Diagnosis not present

## 2022-08-01 DIAGNOSIS — I129 Hypertensive chronic kidney disease with stage 1 through stage 4 chronic kidney disease, or unspecified chronic kidney disease: Secondary | ICD-10-CM | POA: Diagnosis not present

## 2022-08-01 DIAGNOSIS — B372 Candidiasis of skin and nail: Secondary | ICD-10-CM | POA: Diagnosis not present

## 2022-08-01 DIAGNOSIS — E559 Vitamin D deficiency, unspecified: Secondary | ICD-10-CM | POA: Diagnosis not present

## 2022-08-01 DIAGNOSIS — K219 Gastro-esophageal reflux disease without esophagitis: Secondary | ICD-10-CM | POA: Diagnosis not present

## 2022-08-01 DIAGNOSIS — R42 Dizziness and giddiness: Secondary | ICD-10-CM | POA: Diagnosis not present

## 2022-08-01 DIAGNOSIS — N189 Chronic kidney disease, unspecified: Secondary | ICD-10-CM | POA: Diagnosis not present

## 2022-08-01 DIAGNOSIS — M1990 Unspecified osteoarthritis, unspecified site: Secondary | ICD-10-CM | POA: Diagnosis not present

## 2022-08-01 DIAGNOSIS — N390 Urinary tract infection, site not specified: Secondary | ICD-10-CM | POA: Diagnosis not present

## 2022-08-01 DIAGNOSIS — G301 Alzheimer's disease with late onset: Secondary | ICD-10-CM | POA: Diagnosis not present

## 2022-08-01 DIAGNOSIS — Z9181 History of falling: Secondary | ICD-10-CM | POA: Diagnosis not present

## 2022-08-01 DIAGNOSIS — Z792 Long term (current) use of antibiotics: Secondary | ICD-10-CM | POA: Diagnosis not present

## 2022-08-01 DIAGNOSIS — G894 Chronic pain syndrome: Secondary | ICD-10-CM | POA: Diagnosis not present

## 2022-08-11 DIAGNOSIS — G4700 Insomnia, unspecified: Secondary | ICD-10-CM | POA: Diagnosis not present

## 2022-08-11 DIAGNOSIS — K219 Gastro-esophageal reflux disease without esophagitis: Secondary | ICD-10-CM | POA: Diagnosis not present

## 2022-08-11 DIAGNOSIS — M1990 Unspecified osteoarthritis, unspecified site: Secondary | ICD-10-CM | POA: Diagnosis not present

## 2022-08-11 DIAGNOSIS — B372 Candidiasis of skin and nail: Secondary | ICD-10-CM | POA: Diagnosis not present

## 2022-08-11 DIAGNOSIS — I129 Hypertensive chronic kidney disease with stage 1 through stage 4 chronic kidney disease, or unspecified chronic kidney disease: Secondary | ICD-10-CM | POA: Diagnosis not present

## 2022-08-11 DIAGNOSIS — G894 Chronic pain syndrome: Secondary | ICD-10-CM | POA: Diagnosis not present

## 2022-08-11 DIAGNOSIS — G301 Alzheimer's disease with late onset: Secondary | ICD-10-CM | POA: Diagnosis not present

## 2022-08-11 DIAGNOSIS — E559 Vitamin D deficiency, unspecified: Secondary | ICD-10-CM | POA: Diagnosis not present

## 2022-08-11 DIAGNOSIS — N390 Urinary tract infection, site not specified: Secondary | ICD-10-CM | POA: Diagnosis not present

## 2022-08-11 DIAGNOSIS — Z792 Long term (current) use of antibiotics: Secondary | ICD-10-CM | POA: Diagnosis not present

## 2022-08-11 DIAGNOSIS — N189 Chronic kidney disease, unspecified: Secondary | ICD-10-CM | POA: Diagnosis not present

## 2022-08-11 DIAGNOSIS — D631 Anemia in chronic kidney disease: Secondary | ICD-10-CM | POA: Diagnosis not present

## 2022-08-11 DIAGNOSIS — F0283 Dementia in other diseases classified elsewhere, unspecified severity, with mood disturbance: Secondary | ICD-10-CM | POA: Diagnosis not present

## 2022-08-11 DIAGNOSIS — Z9181 History of falling: Secondary | ICD-10-CM | POA: Diagnosis not present

## 2022-08-11 DIAGNOSIS — R42 Dizziness and giddiness: Secondary | ICD-10-CM | POA: Diagnosis not present

## 2022-08-15 DIAGNOSIS — B372 Candidiasis of skin and nail: Secondary | ICD-10-CM | POA: Diagnosis not present

## 2022-08-15 DIAGNOSIS — Z792 Long term (current) use of antibiotics: Secondary | ICD-10-CM | POA: Diagnosis not present

## 2022-08-15 DIAGNOSIS — N390 Urinary tract infection, site not specified: Secondary | ICD-10-CM | POA: Diagnosis not present

## 2022-08-15 DIAGNOSIS — G4700 Insomnia, unspecified: Secondary | ICD-10-CM | POA: Diagnosis not present

## 2022-08-15 DIAGNOSIS — R42 Dizziness and giddiness: Secondary | ICD-10-CM | POA: Diagnosis not present

## 2022-08-15 DIAGNOSIS — Z9181 History of falling: Secondary | ICD-10-CM | POA: Diagnosis not present

## 2022-08-15 DIAGNOSIS — K219 Gastro-esophageal reflux disease without esophagitis: Secondary | ICD-10-CM | POA: Diagnosis not present

## 2022-08-15 DIAGNOSIS — I129 Hypertensive chronic kidney disease with stage 1 through stage 4 chronic kidney disease, or unspecified chronic kidney disease: Secondary | ICD-10-CM | POA: Diagnosis not present

## 2022-08-15 DIAGNOSIS — D631 Anemia in chronic kidney disease: Secondary | ICD-10-CM | POA: Diagnosis not present

## 2022-08-15 DIAGNOSIS — G894 Chronic pain syndrome: Secondary | ICD-10-CM | POA: Diagnosis not present

## 2022-08-15 DIAGNOSIS — N189 Chronic kidney disease, unspecified: Secondary | ICD-10-CM | POA: Diagnosis not present

## 2022-08-15 DIAGNOSIS — M1990 Unspecified osteoarthritis, unspecified site: Secondary | ICD-10-CM | POA: Diagnosis not present

## 2022-08-15 DIAGNOSIS — F0283 Dementia in other diseases classified elsewhere, unspecified severity, with mood disturbance: Secondary | ICD-10-CM | POA: Diagnosis not present

## 2022-08-15 DIAGNOSIS — G301 Alzheimer's disease with late onset: Secondary | ICD-10-CM | POA: Diagnosis not present

## 2022-08-15 DIAGNOSIS — E559 Vitamin D deficiency, unspecified: Secondary | ICD-10-CM | POA: Diagnosis not present

## 2022-08-17 DIAGNOSIS — G894 Chronic pain syndrome: Secondary | ICD-10-CM | POA: Diagnosis not present

## 2022-08-17 DIAGNOSIS — G4700 Insomnia, unspecified: Secondary | ICD-10-CM | POA: Diagnosis not present

## 2022-08-17 DIAGNOSIS — N189 Chronic kidney disease, unspecified: Secondary | ICD-10-CM | POA: Diagnosis not present

## 2022-08-17 DIAGNOSIS — N390 Urinary tract infection, site not specified: Secondary | ICD-10-CM | POA: Diagnosis not present

## 2022-08-17 DIAGNOSIS — Z792 Long term (current) use of antibiotics: Secondary | ICD-10-CM | POA: Diagnosis not present

## 2022-08-17 DIAGNOSIS — K219 Gastro-esophageal reflux disease without esophagitis: Secondary | ICD-10-CM | POA: Diagnosis not present

## 2022-08-17 DIAGNOSIS — F0283 Dementia in other diseases classified elsewhere, unspecified severity, with mood disturbance: Secondary | ICD-10-CM | POA: Diagnosis not present

## 2022-08-17 DIAGNOSIS — G301 Alzheimer's disease with late onset: Secondary | ICD-10-CM | POA: Diagnosis not present

## 2022-08-17 DIAGNOSIS — I129 Hypertensive chronic kidney disease with stage 1 through stage 4 chronic kidney disease, or unspecified chronic kidney disease: Secondary | ICD-10-CM | POA: Diagnosis not present

## 2022-08-17 DIAGNOSIS — B372 Candidiasis of skin and nail: Secondary | ICD-10-CM | POA: Diagnosis not present

## 2022-08-17 DIAGNOSIS — Z9181 History of falling: Secondary | ICD-10-CM | POA: Diagnosis not present

## 2022-08-17 DIAGNOSIS — R42 Dizziness and giddiness: Secondary | ICD-10-CM | POA: Diagnosis not present

## 2022-08-17 DIAGNOSIS — M1990 Unspecified osteoarthritis, unspecified site: Secondary | ICD-10-CM | POA: Diagnosis not present

## 2022-08-17 DIAGNOSIS — D631 Anemia in chronic kidney disease: Secondary | ICD-10-CM | POA: Diagnosis not present

## 2022-08-17 DIAGNOSIS — E559 Vitamin D deficiency, unspecified: Secondary | ICD-10-CM | POA: Diagnosis not present

## 2022-08-21 DIAGNOSIS — Z792 Long term (current) use of antibiotics: Secondary | ICD-10-CM | POA: Diagnosis not present

## 2022-08-21 DIAGNOSIS — I129 Hypertensive chronic kidney disease with stage 1 through stage 4 chronic kidney disease, or unspecified chronic kidney disease: Secondary | ICD-10-CM | POA: Diagnosis not present

## 2022-08-21 DIAGNOSIS — E559 Vitamin D deficiency, unspecified: Secondary | ICD-10-CM | POA: Diagnosis not present

## 2022-08-21 DIAGNOSIS — G4700 Insomnia, unspecified: Secondary | ICD-10-CM | POA: Diagnosis not present

## 2022-08-21 DIAGNOSIS — G301 Alzheimer's disease with late onset: Secondary | ICD-10-CM | POA: Diagnosis not present

## 2022-08-21 DIAGNOSIS — B372 Candidiasis of skin and nail: Secondary | ICD-10-CM | POA: Diagnosis not present

## 2022-08-21 DIAGNOSIS — D631 Anemia in chronic kidney disease: Secondary | ICD-10-CM | POA: Diagnosis not present

## 2022-08-21 DIAGNOSIS — R42 Dizziness and giddiness: Secondary | ICD-10-CM | POA: Diagnosis not present

## 2022-08-21 DIAGNOSIS — Z9181 History of falling: Secondary | ICD-10-CM | POA: Diagnosis not present

## 2022-08-21 DIAGNOSIS — G894 Chronic pain syndrome: Secondary | ICD-10-CM | POA: Diagnosis not present

## 2022-08-21 DIAGNOSIS — N189 Chronic kidney disease, unspecified: Secondary | ICD-10-CM | POA: Diagnosis not present

## 2022-08-21 DIAGNOSIS — N390 Urinary tract infection, site not specified: Secondary | ICD-10-CM | POA: Diagnosis not present

## 2022-08-21 DIAGNOSIS — F0283 Dementia in other diseases classified elsewhere, unspecified severity, with mood disturbance: Secondary | ICD-10-CM | POA: Diagnosis not present

## 2022-08-21 DIAGNOSIS — M1990 Unspecified osteoarthritis, unspecified site: Secondary | ICD-10-CM | POA: Diagnosis not present

## 2022-08-21 DIAGNOSIS — K219 Gastro-esophageal reflux disease without esophagitis: Secondary | ICD-10-CM | POA: Diagnosis not present

## 2022-08-28 DIAGNOSIS — G894 Chronic pain syndrome: Secondary | ICD-10-CM | POA: Diagnosis not present

## 2022-08-28 DIAGNOSIS — M1990 Unspecified osteoarthritis, unspecified site: Secondary | ICD-10-CM | POA: Diagnosis not present

## 2022-08-28 DIAGNOSIS — N189 Chronic kidney disease, unspecified: Secondary | ICD-10-CM | POA: Diagnosis not present

## 2022-08-28 DIAGNOSIS — R42 Dizziness and giddiness: Secondary | ICD-10-CM | POA: Diagnosis not present

## 2022-08-28 DIAGNOSIS — N390 Urinary tract infection, site not specified: Secondary | ICD-10-CM | POA: Diagnosis not present

## 2022-08-28 DIAGNOSIS — E559 Vitamin D deficiency, unspecified: Secondary | ICD-10-CM | POA: Diagnosis not present

## 2022-08-28 DIAGNOSIS — B372 Candidiasis of skin and nail: Secondary | ICD-10-CM | POA: Diagnosis not present

## 2022-08-28 DIAGNOSIS — D631 Anemia in chronic kidney disease: Secondary | ICD-10-CM | POA: Diagnosis not present

## 2022-08-28 DIAGNOSIS — Z9181 History of falling: Secondary | ICD-10-CM | POA: Diagnosis not present

## 2022-08-28 DIAGNOSIS — F0283 Dementia in other diseases classified elsewhere, unspecified severity, with mood disturbance: Secondary | ICD-10-CM | POA: Diagnosis not present

## 2022-08-28 DIAGNOSIS — G4700 Insomnia, unspecified: Secondary | ICD-10-CM | POA: Diagnosis not present

## 2022-08-28 DIAGNOSIS — G301 Alzheimer's disease with late onset: Secondary | ICD-10-CM | POA: Diagnosis not present

## 2022-08-28 DIAGNOSIS — K219 Gastro-esophageal reflux disease without esophagitis: Secondary | ICD-10-CM | POA: Diagnosis not present

## 2022-08-28 DIAGNOSIS — I129 Hypertensive chronic kidney disease with stage 1 through stage 4 chronic kidney disease, or unspecified chronic kidney disease: Secondary | ICD-10-CM | POA: Diagnosis not present

## 2022-08-28 DIAGNOSIS — Z792 Long term (current) use of antibiotics: Secondary | ICD-10-CM | POA: Diagnosis not present

## 2022-09-05 DIAGNOSIS — F0283 Dementia in other diseases classified elsewhere, unspecified severity, with mood disturbance: Secondary | ICD-10-CM | POA: Diagnosis not present

## 2022-09-05 DIAGNOSIS — G4700 Insomnia, unspecified: Secondary | ICD-10-CM | POA: Diagnosis not present

## 2022-09-05 DIAGNOSIS — B372 Candidiasis of skin and nail: Secondary | ICD-10-CM | POA: Diagnosis not present

## 2022-09-05 DIAGNOSIS — N189 Chronic kidney disease, unspecified: Secondary | ICD-10-CM | POA: Diagnosis not present

## 2022-09-05 DIAGNOSIS — I129 Hypertensive chronic kidney disease with stage 1 through stage 4 chronic kidney disease, or unspecified chronic kidney disease: Secondary | ICD-10-CM | POA: Diagnosis not present

## 2022-09-05 DIAGNOSIS — G301 Alzheimer's disease with late onset: Secondary | ICD-10-CM | POA: Diagnosis not present

## 2022-09-05 DIAGNOSIS — M1990 Unspecified osteoarthritis, unspecified site: Secondary | ICD-10-CM | POA: Diagnosis not present

## 2022-09-05 DIAGNOSIS — K219 Gastro-esophageal reflux disease without esophagitis: Secondary | ICD-10-CM | POA: Diagnosis not present

## 2022-09-05 DIAGNOSIS — Z9181 History of falling: Secondary | ICD-10-CM | POA: Diagnosis not present

## 2022-09-05 DIAGNOSIS — D631 Anemia in chronic kidney disease: Secondary | ICD-10-CM | POA: Diagnosis not present

## 2022-09-05 DIAGNOSIS — G894 Chronic pain syndrome: Secondary | ICD-10-CM | POA: Diagnosis not present

## 2022-09-05 DIAGNOSIS — R42 Dizziness and giddiness: Secondary | ICD-10-CM | POA: Diagnosis not present

## 2022-09-05 DIAGNOSIS — Z792 Long term (current) use of antibiotics: Secondary | ICD-10-CM | POA: Diagnosis not present

## 2022-09-05 DIAGNOSIS — E559 Vitamin D deficiency, unspecified: Secondary | ICD-10-CM | POA: Diagnosis not present

## 2022-09-05 DIAGNOSIS — N390 Urinary tract infection, site not specified: Secondary | ICD-10-CM | POA: Diagnosis not present

## 2022-09-16 DIAGNOSIS — Z792 Long term (current) use of antibiotics: Secondary | ICD-10-CM | POA: Diagnosis not present

## 2022-09-16 DIAGNOSIS — N189 Chronic kidney disease, unspecified: Secondary | ICD-10-CM | POA: Diagnosis not present

## 2022-09-16 DIAGNOSIS — N390 Urinary tract infection, site not specified: Secondary | ICD-10-CM | POA: Diagnosis not present

## 2022-09-16 DIAGNOSIS — K219 Gastro-esophageal reflux disease without esophagitis: Secondary | ICD-10-CM | POA: Diagnosis not present

## 2022-09-16 DIAGNOSIS — G301 Alzheimer's disease with late onset: Secondary | ICD-10-CM | POA: Diagnosis not present

## 2022-09-16 DIAGNOSIS — I129 Hypertensive chronic kidney disease with stage 1 through stage 4 chronic kidney disease, or unspecified chronic kidney disease: Secondary | ICD-10-CM | POA: Diagnosis not present

## 2022-09-16 DIAGNOSIS — D631 Anemia in chronic kidney disease: Secondary | ICD-10-CM | POA: Diagnosis not present

## 2022-09-16 DIAGNOSIS — G894 Chronic pain syndrome: Secondary | ICD-10-CM | POA: Diagnosis not present

## 2022-09-16 DIAGNOSIS — Z9181 History of falling: Secondary | ICD-10-CM | POA: Diagnosis not present

## 2022-09-16 DIAGNOSIS — G4700 Insomnia, unspecified: Secondary | ICD-10-CM | POA: Diagnosis not present

## 2022-09-16 DIAGNOSIS — R42 Dizziness and giddiness: Secondary | ICD-10-CM | POA: Diagnosis not present

## 2022-09-16 DIAGNOSIS — B372 Candidiasis of skin and nail: Secondary | ICD-10-CM | POA: Diagnosis not present

## 2022-09-16 DIAGNOSIS — M1990 Unspecified osteoarthritis, unspecified site: Secondary | ICD-10-CM | POA: Diagnosis not present

## 2022-09-16 DIAGNOSIS — F0283 Dementia in other diseases classified elsewhere, unspecified severity, with mood disturbance: Secondary | ICD-10-CM | POA: Diagnosis not present

## 2022-09-16 DIAGNOSIS — E559 Vitamin D deficiency, unspecified: Secondary | ICD-10-CM | POA: Diagnosis not present

## 2022-09-19 DIAGNOSIS — Z792 Long term (current) use of antibiotics: Secondary | ICD-10-CM | POA: Diagnosis not present

## 2022-09-19 DIAGNOSIS — G894 Chronic pain syndrome: Secondary | ICD-10-CM | POA: Diagnosis not present

## 2022-09-19 DIAGNOSIS — N390 Urinary tract infection, site not specified: Secondary | ICD-10-CM | POA: Diagnosis not present

## 2022-09-19 DIAGNOSIS — B372 Candidiasis of skin and nail: Secondary | ICD-10-CM | POA: Diagnosis not present

## 2022-09-19 DIAGNOSIS — I129 Hypertensive chronic kidney disease with stage 1 through stage 4 chronic kidney disease, or unspecified chronic kidney disease: Secondary | ICD-10-CM | POA: Diagnosis not present

## 2022-09-19 DIAGNOSIS — M1990 Unspecified osteoarthritis, unspecified site: Secondary | ICD-10-CM | POA: Diagnosis not present

## 2022-09-19 DIAGNOSIS — N189 Chronic kidney disease, unspecified: Secondary | ICD-10-CM | POA: Diagnosis not present

## 2022-09-19 DIAGNOSIS — D631 Anemia in chronic kidney disease: Secondary | ICD-10-CM | POA: Diagnosis not present

## 2022-09-19 DIAGNOSIS — E559 Vitamin D deficiency, unspecified: Secondary | ICD-10-CM | POA: Diagnosis not present

## 2022-09-19 DIAGNOSIS — R42 Dizziness and giddiness: Secondary | ICD-10-CM | POA: Diagnosis not present

## 2022-09-19 DIAGNOSIS — F0283 Dementia in other diseases classified elsewhere, unspecified severity, with mood disturbance: Secondary | ICD-10-CM | POA: Diagnosis not present

## 2022-09-19 DIAGNOSIS — K219 Gastro-esophageal reflux disease without esophagitis: Secondary | ICD-10-CM | POA: Diagnosis not present

## 2022-09-19 DIAGNOSIS — Z9181 History of falling: Secondary | ICD-10-CM | POA: Diagnosis not present

## 2022-09-19 DIAGNOSIS — G301 Alzheimer's disease with late onset: Secondary | ICD-10-CM | POA: Diagnosis not present

## 2022-09-19 DIAGNOSIS — G4700 Insomnia, unspecified: Secondary | ICD-10-CM | POA: Diagnosis not present

## 2022-09-27 DIAGNOSIS — R42 Dizziness and giddiness: Secondary | ICD-10-CM | POA: Diagnosis not present

## 2022-09-27 DIAGNOSIS — N189 Chronic kidney disease, unspecified: Secondary | ICD-10-CM | POA: Diagnosis not present

## 2022-09-27 DIAGNOSIS — G894 Chronic pain syndrome: Secondary | ICD-10-CM | POA: Diagnosis not present

## 2022-09-27 DIAGNOSIS — K219 Gastro-esophageal reflux disease without esophagitis: Secondary | ICD-10-CM | POA: Diagnosis not present

## 2022-09-27 DIAGNOSIS — B372 Candidiasis of skin and nail: Secondary | ICD-10-CM | POA: Diagnosis not present

## 2022-09-27 DIAGNOSIS — G4700 Insomnia, unspecified: Secondary | ICD-10-CM | POA: Diagnosis not present

## 2022-09-27 DIAGNOSIS — Z9181 History of falling: Secondary | ICD-10-CM | POA: Diagnosis not present

## 2022-09-27 DIAGNOSIS — F0283 Dementia in other diseases classified elsewhere, unspecified severity, with mood disturbance: Secondary | ICD-10-CM | POA: Diagnosis not present

## 2022-09-27 DIAGNOSIS — M1990 Unspecified osteoarthritis, unspecified site: Secondary | ICD-10-CM | POA: Diagnosis not present

## 2022-09-27 DIAGNOSIS — I129 Hypertensive chronic kidney disease with stage 1 through stage 4 chronic kidney disease, or unspecified chronic kidney disease: Secondary | ICD-10-CM | POA: Diagnosis not present

## 2022-09-27 DIAGNOSIS — Z792 Long term (current) use of antibiotics: Secondary | ICD-10-CM | POA: Diagnosis not present

## 2022-09-27 DIAGNOSIS — G301 Alzheimer's disease with late onset: Secondary | ICD-10-CM | POA: Diagnosis not present

## 2022-09-27 DIAGNOSIS — N390 Urinary tract infection, site not specified: Secondary | ICD-10-CM | POA: Diagnosis not present

## 2022-09-27 DIAGNOSIS — D631 Anemia in chronic kidney disease: Secondary | ICD-10-CM | POA: Diagnosis not present

## 2022-09-27 DIAGNOSIS — E559 Vitamin D deficiency, unspecified: Secondary | ICD-10-CM | POA: Diagnosis not present

## 2022-10-04 DIAGNOSIS — B372 Candidiasis of skin and nail: Secondary | ICD-10-CM | POA: Diagnosis not present

## 2022-10-04 DIAGNOSIS — N390 Urinary tract infection, site not specified: Secondary | ICD-10-CM | POA: Diagnosis not present

## 2022-10-04 DIAGNOSIS — Z792 Long term (current) use of antibiotics: Secondary | ICD-10-CM | POA: Diagnosis not present

## 2022-10-04 DIAGNOSIS — R42 Dizziness and giddiness: Secondary | ICD-10-CM | POA: Diagnosis not present

## 2022-10-04 DIAGNOSIS — F0283 Dementia in other diseases classified elsewhere, unspecified severity, with mood disturbance: Secondary | ICD-10-CM | POA: Diagnosis not present

## 2022-10-04 DIAGNOSIS — G301 Alzheimer's disease with late onset: Secondary | ICD-10-CM | POA: Diagnosis not present

## 2022-10-04 DIAGNOSIS — D631 Anemia in chronic kidney disease: Secondary | ICD-10-CM | POA: Diagnosis not present

## 2022-10-04 DIAGNOSIS — G4700 Insomnia, unspecified: Secondary | ICD-10-CM | POA: Diagnosis not present

## 2022-10-04 DIAGNOSIS — G894 Chronic pain syndrome: Secondary | ICD-10-CM | POA: Diagnosis not present

## 2022-10-04 DIAGNOSIS — Z9181 History of falling: Secondary | ICD-10-CM | POA: Diagnosis not present

## 2022-10-04 DIAGNOSIS — N189 Chronic kidney disease, unspecified: Secondary | ICD-10-CM | POA: Diagnosis not present

## 2022-10-04 DIAGNOSIS — K219 Gastro-esophageal reflux disease without esophagitis: Secondary | ICD-10-CM | POA: Diagnosis not present

## 2022-10-04 DIAGNOSIS — E559 Vitamin D deficiency, unspecified: Secondary | ICD-10-CM | POA: Diagnosis not present

## 2022-10-04 DIAGNOSIS — I129 Hypertensive chronic kidney disease with stage 1 through stage 4 chronic kidney disease, or unspecified chronic kidney disease: Secondary | ICD-10-CM | POA: Diagnosis not present

## 2022-10-04 DIAGNOSIS — M1990 Unspecified osteoarthritis, unspecified site: Secondary | ICD-10-CM | POA: Diagnosis not present

## 2022-10-10 DIAGNOSIS — K219 Gastro-esophageal reflux disease without esophagitis: Secondary | ICD-10-CM | POA: Diagnosis not present

## 2022-10-10 DIAGNOSIS — G4700 Insomnia, unspecified: Secondary | ICD-10-CM | POA: Diagnosis not present

## 2022-10-10 DIAGNOSIS — I129 Hypertensive chronic kidney disease with stage 1 through stage 4 chronic kidney disease, or unspecified chronic kidney disease: Secondary | ICD-10-CM | POA: Diagnosis not present

## 2022-10-10 DIAGNOSIS — Z9181 History of falling: Secondary | ICD-10-CM | POA: Diagnosis not present

## 2022-10-10 DIAGNOSIS — F0283 Dementia in other diseases classified elsewhere, unspecified severity, with mood disturbance: Secondary | ICD-10-CM | POA: Diagnosis not present

## 2022-10-10 DIAGNOSIS — M1990 Unspecified osteoarthritis, unspecified site: Secondary | ICD-10-CM | POA: Diagnosis not present

## 2022-10-10 DIAGNOSIS — G301 Alzheimer's disease with late onset: Secondary | ICD-10-CM | POA: Diagnosis not present

## 2022-10-10 DIAGNOSIS — D631 Anemia in chronic kidney disease: Secondary | ICD-10-CM | POA: Diagnosis not present

## 2022-10-10 DIAGNOSIS — B372 Candidiasis of skin and nail: Secondary | ICD-10-CM | POA: Diagnosis not present

## 2022-10-10 DIAGNOSIS — R42 Dizziness and giddiness: Secondary | ICD-10-CM | POA: Diagnosis not present

## 2022-10-10 DIAGNOSIS — N189 Chronic kidney disease, unspecified: Secondary | ICD-10-CM | POA: Diagnosis not present

## 2022-10-10 DIAGNOSIS — E559 Vitamin D deficiency, unspecified: Secondary | ICD-10-CM | POA: Diagnosis not present

## 2022-10-10 DIAGNOSIS — G894 Chronic pain syndrome: Secondary | ICD-10-CM | POA: Diagnosis not present

## 2022-10-10 DIAGNOSIS — Z792 Long term (current) use of antibiotics: Secondary | ICD-10-CM | POA: Diagnosis not present

## 2022-10-10 DIAGNOSIS — N390 Urinary tract infection, site not specified: Secondary | ICD-10-CM | POA: Diagnosis not present

## 2022-10-23 DIAGNOSIS — I10 Essential (primary) hypertension: Secondary | ICD-10-CM | POA: Diagnosis not present

## 2022-10-23 DIAGNOSIS — R296 Repeated falls: Secondary | ICD-10-CM | POA: Diagnosis not present

## 2022-10-23 DIAGNOSIS — S41112A Laceration without foreign body of left upper arm, initial encounter: Secondary | ICD-10-CM | POA: Diagnosis not present

## 2022-10-23 DIAGNOSIS — D649 Anemia, unspecified: Secondary | ICD-10-CM | POA: Diagnosis not present

## 2022-10-30 DIAGNOSIS — F0390 Unspecified dementia without behavioral disturbance: Secondary | ICD-10-CM | POA: Diagnosis not present

## 2022-10-30 DIAGNOSIS — M6281 Muscle weakness (generalized): Secondary | ICD-10-CM | POA: Diagnosis not present

## 2023-01-29 DIAGNOSIS — R64 Cachexia: Secondary | ICD-10-CM | POA: Diagnosis not present

## 2023-01-29 DIAGNOSIS — I10 Essential (primary) hypertension: Secondary | ICD-10-CM | POA: Diagnosis not present

## 2023-01-29 DIAGNOSIS — R634 Abnormal weight loss: Secondary | ICD-10-CM | POA: Diagnosis not present

## 2023-01-29 DIAGNOSIS — G301 Alzheimer's disease with late onset: Secondary | ICD-10-CM | POA: Diagnosis not present

## 2023-03-05 ENCOUNTER — Encounter (HOSPITAL_BASED_OUTPATIENT_CLINIC_OR_DEPARTMENT_OTHER): Payer: Self-pay | Admitting: Emergency Medicine

## 2023-03-05 ENCOUNTER — Other Ambulatory Visit: Payer: Self-pay

## 2023-03-05 ENCOUNTER — Emergency Department (HOSPITAL_BASED_OUTPATIENT_CLINIC_OR_DEPARTMENT_OTHER)
Admission: EM | Admit: 2023-03-05 | Discharge: 2023-03-06 | Disposition: A | Discharge status: Home / Self Care | Payer: Medicare Other | Attending: Emergency Medicine | Admitting: Emergency Medicine

## 2023-03-05 DIAGNOSIS — N189 Chronic kidney disease, unspecified: Secondary | ICD-10-CM | POA: Insufficient documentation

## 2023-03-05 DIAGNOSIS — S0990XA Unspecified injury of head, initial encounter: Secondary | ICD-10-CM | POA: Diagnosis not present

## 2023-03-05 DIAGNOSIS — I129 Hypertensive chronic kidney disease with stage 1 through stage 4 chronic kidney disease, or unspecified chronic kidney disease: Secondary | ICD-10-CM | POA: Insufficient documentation

## 2023-03-05 DIAGNOSIS — F0390 Unspecified dementia without behavioral disturbance: Secondary | ICD-10-CM | POA: Diagnosis not present

## 2023-03-05 DIAGNOSIS — W19XXXA Unspecified fall, initial encounter: Secondary | ICD-10-CM

## 2023-03-05 DIAGNOSIS — W06XXXA Fall from bed, initial encounter: Secondary | ICD-10-CM | POA: Insufficient documentation

## 2023-03-05 DIAGNOSIS — Y92129 Unspecified place in nursing home as the place of occurrence of the external cause: Secondary | ICD-10-CM | POA: Diagnosis not present

## 2023-03-05 DIAGNOSIS — S0101XA Laceration without foreign body of scalp, initial encounter: Secondary | ICD-10-CM | POA: Diagnosis not present

## 2023-03-05 DIAGNOSIS — Z743 Need for continuous supervision: Secondary | ICD-10-CM | POA: Diagnosis not present

## 2023-03-05 DIAGNOSIS — R58 Hemorrhage, not elsewhere classified: Secondary | ICD-10-CM | POA: Diagnosis not present

## 2023-03-05 NOTE — ED Triage Notes (Signed)
Patient presents via EMS from [facility] assisted living after being found on bedroom floor. Staff unsure of loc. Patient at baseline per EMS. Hx Dementia.  Abrasion and hematoma noted to left upper forehead. Bleeding controlled.  DNR in place

## 2023-03-06 ENCOUNTER — Emergency Department (HOSPITAL_BASED_OUTPATIENT_CLINIC_OR_DEPARTMENT_OTHER): Payer: Medicare Other

## 2023-03-06 DIAGNOSIS — G319 Degenerative disease of nervous system, unspecified: Secondary | ICD-10-CM | POA: Diagnosis not present

## 2023-03-06 DIAGNOSIS — S0003XA Contusion of scalp, initial encounter: Secondary | ICD-10-CM | POA: Diagnosis not present

## 2023-03-06 DIAGNOSIS — G9389 Other specified disorders of brain: Secondary | ICD-10-CM | POA: Diagnosis not present

## 2023-03-06 DIAGNOSIS — M47812 Spondylosis without myelopathy or radiculopathy, cervical region: Secondary | ICD-10-CM | POA: Diagnosis not present

## 2023-03-06 NOTE — ED Provider Notes (Signed)
[place] [facility] AT [facility] Provider Note   CSN: [id number] Arrival date & time: 03/05/23  2244     History  Chief Complaint  Patient presents with   Marletta Lor    Kimberly Gregory is a 87 y.o. female.  The history is provided by the nursing home and a relative. The history is limited by the condition of the patient (Dementia).  Fall  She has history of hypertension, hyperlipidemia, chronic kidney disease, dementia and comes in by ambulance after falling out of bed at her nursing home.  She did suffer a scalp laceration.  Son states that there was a lot of blood on the floor.  He states she is at her baseline mental status.  He thinks she is up-to-date on tetanus immunizations.  She is DNR.   Home Medications Prior to Admission medications   Medication Sig Start Date End Date Taking? Authorizing Provider  ARIPiprazole (ABILIFY) 2 MG tablet Take 2 mg by mouth daily. 07/31/20   [provider]  cephALEXin (KEFLEX) 500 MG capsule Take 1 capsule (500 mg total) by mouth 4 (four) times daily. 07/28/21   Benjiman Core, MD  Cholecalciferol (VITAMIN D3) 1.25 MG (50000 UT) CAPS Take 1 capsule 3  /week for Vitamin D Deficiency 01/25/19   Lucky Cowboy, MD  divalproex (DEPAKOTE SPRINKLE) 125 MG capsule Take 250 mg by mouth with breakfast, with lunch, and with evening meal. 07/31/20   [provider]  feeding supplement (ENSURE ENLIVE / ENSURE PLUS) LIQD Take 237 mLs by mouth daily. 09/12/20   Swayze, Ava, DO  iron polysaccharides (NIFEREX) 150 MG capsule Take 1 capsule (150 mg total) by mouth daily. 10/04/20 11/03/20  Rachael Fee, MD  Multiple Vitamin (MULTIVITAMIN WITH MINERALS) TABS tablet Take 1 tablet by mouth daily. 09/12/20   Swayze, Ava, DO  ondansetron (ZOFRAN) 4 MG tablet Take 1 tablet (4 mg total) by mouth every 8 (eight) hours as needed for nausea or vomiting. 09/12/20   Swayze, Ava, DO  sertraline (ZOLOFT) 25 MG tablet Take 25 mg by mouth daily.  07/31/20   [provider]      Allergies    Patient has no known allergies.    Review of Systems   Review of Systems  Unable to perform ROS: Dementia    Physical Exam Updated Vital Signs BP (!) 151/61   Pulse 72   Temp 97.7 F (36.5 C) (Axillary)   Resp 16   SpO2 100%  Physical Exam Vitals reviewed.   87 year old female, resting comfortably and in no acute distress. Vital signs are significant for slightly slow heart rate. Oxygen saturation is 100%, which is normal. Head is normocephalic.  Scalp laceration is noted in the left frontal region. PERRLA, EOMI. Oropharynx is clear. Neck is nontender. Back is nontender. Lungs are clear without rales, wheezes, or rhonchi. Chest is nontender. Heart has regular rate and rhythm without murmur. Abdomen is soft, flat, nontender. Extremities have no signs of trauma. Skin is warm and dry without rash. Neurologic: Awake but nonverbal, does respond to pain.    ED Results / Procedures / Treatments    Radiology CT Head Wo Contrast Result Date: 03/06/2023 CLINICAL DATA:  Found down at assisted living facility, unknown loss of consciousness, forehead abrasion and hematoma EXAM: CT HEAD WITHOUT CONTRAST CT CERVICAL SPINE WITHOUT CONTRAST TECHNIQUE: Multidetector CT imaging of the head and cervical spine was performed following the standard protocol without intravenous contrast. Multiplanar CT image reconstructions of the  cervical spine were also generated. RADIATION DOSE REDUCTION: This exam was performed according to the departmental dose-optimization program which includes automated exposure control, adjustment of the mA and/or kV according to patient size and/or use of iterative reconstruction technique. COMPARISON:  11/26/2021 CT head and cervical spine FINDINGS: CT HEAD FINDINGS Evaluation is somewhat limited by motion artifact. Brain: No evidence of acute infarct, hemorrhage, mass, mass effect, or midline shift. No hydrocephalus or  extra-axial fluid collection. Age related cerebral atrophy, with ex vacuo dilatation ventricles, with particular prominence of the temporal horns. Periventricular white matter changes, likely the sequela of chronic small vessel ischemic disease. Vascular: No hyperdense vessel. Skull: Negative for fracture or focal lesion. Left frontal scalp hematoma. Sinuses/Orbits: Limited evaluation secondary to motion. CT CERVICAL SPINE FINDINGS Alignment: No traumatic listhesis. Skull base and vertebrae: No acute fracture or suspicious osseous lesion. Soft tissues and spinal canal: No prevertebral fluid or swelling. No visible canal hematoma. Disc levels: Degenerative changes in the cervical spine.No high-grade spinal canal stenosis. Upper chest: Linear opacities, likely scarring. No pleural effusion or pneumothorax. IMPRESSION: 1. No acute intracranial process. Left frontal scalp hematoma. 2. No acute fracture or traumatic listhesis in the cervical spine. Electronically Signed   By: Wiliam Ke M.D.   On: 03/06/2023 01:39   CT Cervical Spine Wo Contrast Result Date: 03/06/2023 CLINICAL DATA:  Found down at assisted living facility, unknown loss of consciousness, forehead abrasion and hematoma EXAM: CT HEAD WITHOUT CONTRAST CT CERVICAL SPINE WITHOUT CONTRAST TECHNIQUE: Multidetector CT imaging of the head and cervical spine was performed following the standard protocol without intravenous contrast. Multiplanar CT image reconstructions of the cervical spine were also generated. RADIATION DOSE REDUCTION: This exam was performed according to the departmental dose-optimization program which includes automated exposure control, adjustment of the mA and/or kV according to patient size and/or use of iterative reconstruction technique. COMPARISON:  11/26/2021 CT head and cervical spine FINDINGS: CT HEAD FINDINGS Evaluation is somewhat limited by motion artifact. Brain: No evidence of acute infarct, hemorrhage, mass, mass effect,  or midline shift. No hydrocephalus or extra-axial fluid collection. Age related cerebral atrophy, with ex vacuo dilatation ventricles, with particular prominence of the temporal horns. Periventricular white matter changes, likely the sequela of chronic small vessel ischemic disease. Vascular: No hyperdense vessel. Skull: Negative for fracture or focal lesion. Left frontal scalp hematoma. Sinuses/Orbits: Limited evaluation secondary to motion. CT CERVICAL SPINE FINDINGS Alignment: No traumatic listhesis. Skull base and vertebrae: No acute fracture or suspicious osseous lesion. Soft tissues and spinal canal: No prevertebral fluid or swelling. No visible canal hematoma. Disc levels: Degenerative changes in the cervical spine.No high-grade spinal canal stenosis. Upper chest: Linear opacities, likely scarring. No pleural effusion or pneumothorax. IMPRESSION: 1. No acute intracranial process. Left frontal scalp hematoma. 2. No acute fracture or traumatic listhesis in the cervical spine. Electronically Signed   By: Wiliam Ke M.D.   On: 03/06/2023 01:39    Procedures .Laceration Repair  Date/Time: 03/06/2023 1:34 AM  Performed by: Dione Booze, MD Authorized by: Dione Booze, MD   Consent:    Consent obtained:  Verbal   Consent given by:  Guardian   Risks discussed:  Infection and pain   Alternatives discussed:  No treatment Universal protocol:    Procedure explained and questions answered to patient or proxy's satisfaction: yes     Relevant documents present and verified: yes     Test results available: yes     Imaging studies available: yes  Required blood products, implants, devices, and special equipment available: yes     Site/side marked: yes     Immediately prior to procedure, a time out was called: yes     Patient identity confirmed:  Arm band and hospital-assigned identification number Anesthesia:    Anesthesia method:  None Laceration details:    Location:  Scalp   Scalp  location:  Frontal   Length (cm):  2.6   Depth (mm):  3 Pre-procedure details:    Preparation:  Patient was prepped and draped in usual sterile fashion and imaging obtained to evaluate for foreign bodies Exploration:    Limited defect created (wound extended): no     Hemostasis achieved with:  Direct pressure   Imaging obtained: x-ray     Imaging outcome: foreign body not noted     Wound exploration: entire depth of wound visualized     Wound extent: no foreign body and no underlying fracture     Contaminated: no   Treatment:    Area cleansed with:  Saline   Amount of cleaning:  Standard   Debridement:  None   Undermining:  None   Scar revision: no   Skin repair:    Repair method:  Staples   Number of staples:  4 Approximation:    Approximation:  Close Repair type:    Repair type:  Simple Post-procedure details:    Dressing:  Open (no dressing)   Procedure completion:  Tolerated well, no immediate complications     Medications Ordered in ED Medications - No data to display  ED Course/ Medical Decision Making/ A&P                                 Medical Decision Making Amount and/or Complexity of Data Reviewed Radiology: ordered.   Fall at nursing home with scalp laceration.  I reviewed her past records, and she had Tdap on 11/26/2021.  CT of head and cervical spine showed no evidence of acute injury per my reading with radiologist interpretation pending.  I have closed the scalp wound with staples.  Radiology report on CT scans agrees that there is no evidence of acute injury.  I am discharging her back to her skilled nursing facility with instructions to have staples removed in 7 days.  Final Clinical Impression(s) / ED Diagnoses Final diagnoses:  Fall at nursing home, initial encounter  Scalp laceration, initial encounter    Rx / DC Orders ED Discharge Orders     None         Dione Booze, MD 03/06/23 [phone]

## 2023-03-06 NOTE — ED Notes (Signed)
-  Called [facility] for [facility] transportation at 200am.

## 2023-03-19 DIAGNOSIS — G301 Alzheimer's disease with late onset: Secondary | ICD-10-CM | POA: Diagnosis not present

## 2023-03-19 DIAGNOSIS — S0181XD Laceration without foreign body of other part of head, subsequent encounter: Secondary | ICD-10-CM | POA: Diagnosis not present

## 2023-03-19 DIAGNOSIS — I10 Essential (primary) hypertension: Secondary | ICD-10-CM | POA: Diagnosis not present

## 2023-03-19 DIAGNOSIS — R269 Unspecified abnormalities of gait and mobility: Secondary | ICD-10-CM | POA: Diagnosis not present

## 2023-05-17 ENCOUNTER — Encounter: Payer: Self-pay | Admitting: *Deleted

## 2023-05-22 IMAGING — DX DG CHEST 1V PORT
1 series · 1 of 1 positions shown · non-contrast
Comparison: April 21, 2020

CLINICAL DATA: Tremors with nausea and vomiting.

EXAM:
PORTABLE CHEST 1 VIEW

[chest ap]
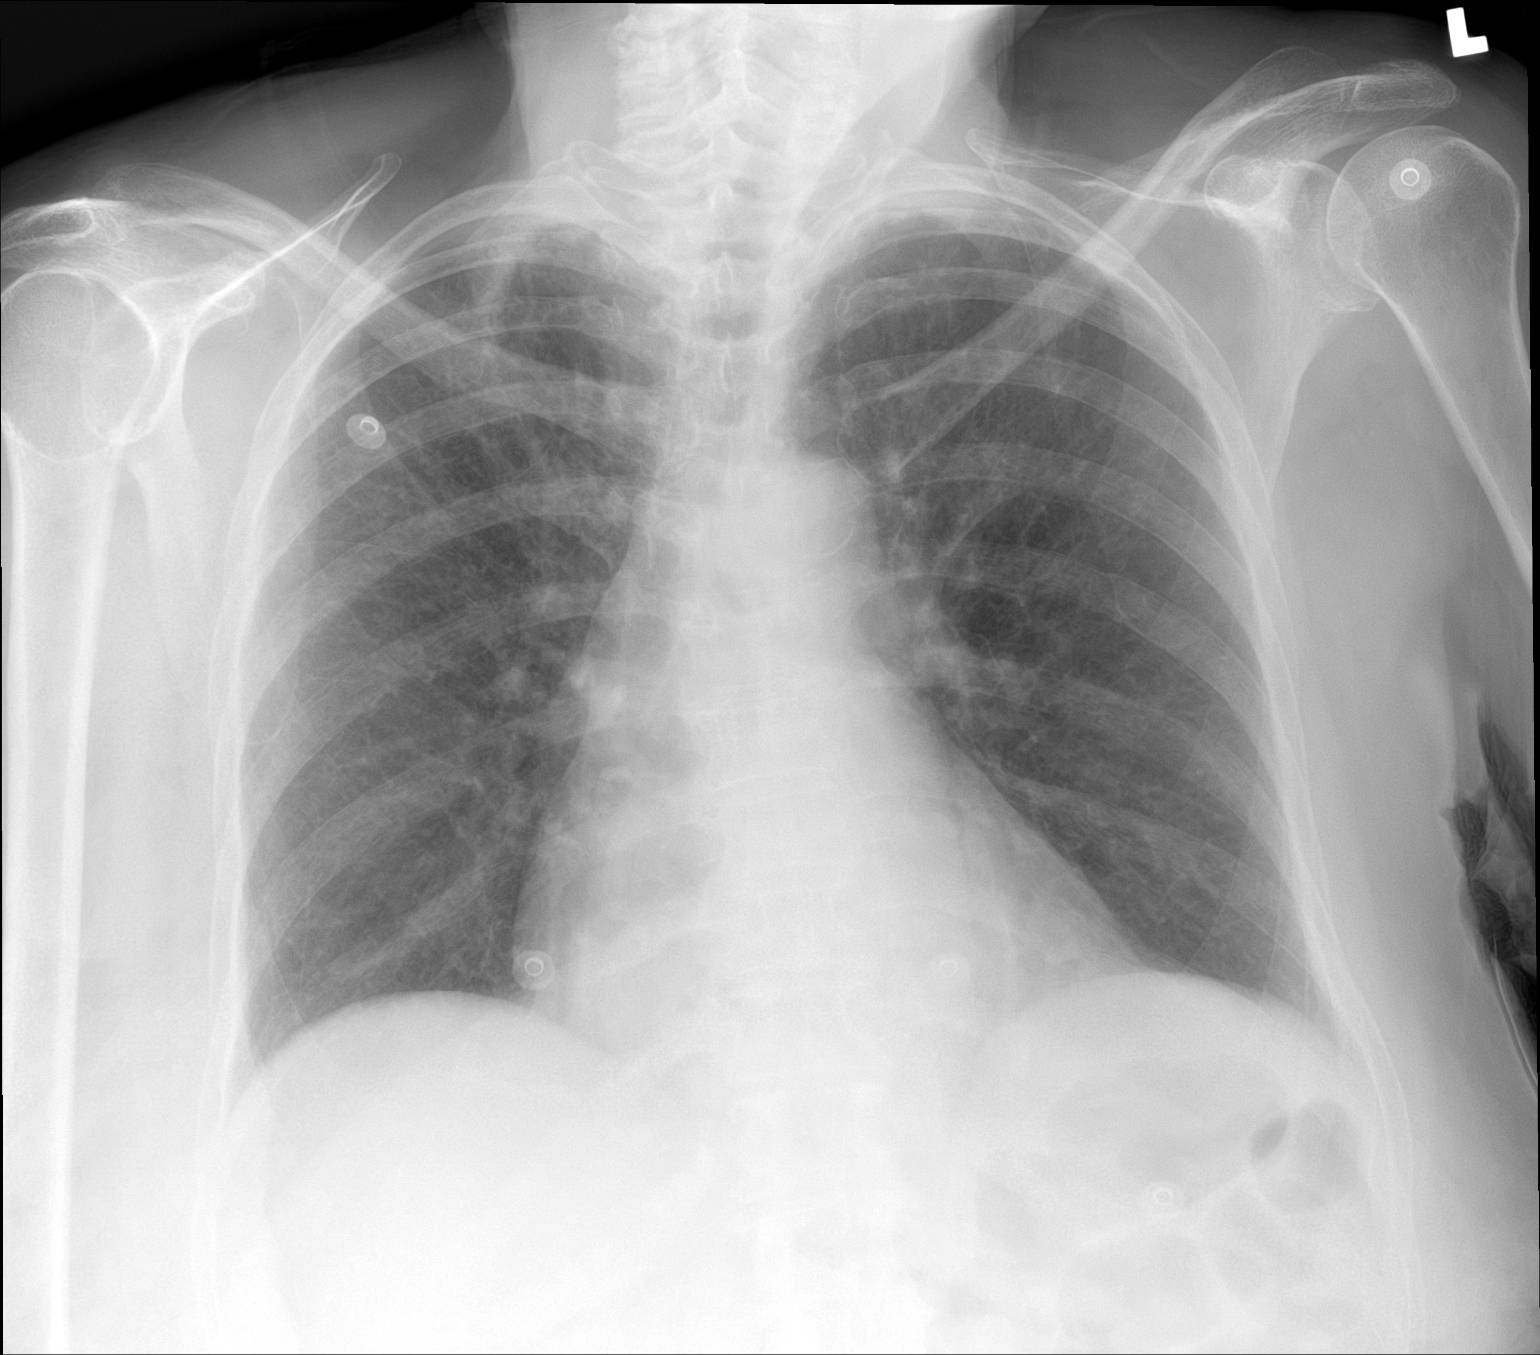

[1 of 1 positions shown; findings below may reference images not displayed]

FINDINGS: Mild, diffuse, chronic appearing increased interstitial lung
markings are seen. Mild, stable right apical atelectasis is noted.
There is no evidence of a pleural effusion or pneumothorax. The
heart size and mediastinal contours are within normal limits. There
is mild calcification of the aortic arch. The visualized skeletal
structures are unremarkable.
IMPRESSION: No active cardiopulmonary disease.

## 2023-07-12 DEATH — deceased

## 2023-09-11 IMAGING — CR DG CHEST 2V
2 series · 2 of 2 positions shown · non-contrast
Comparison: 09/28/2020

CLINICAL DATA: Fever

EXAM:
CHEST - 2 VIEW

[w chest lat]
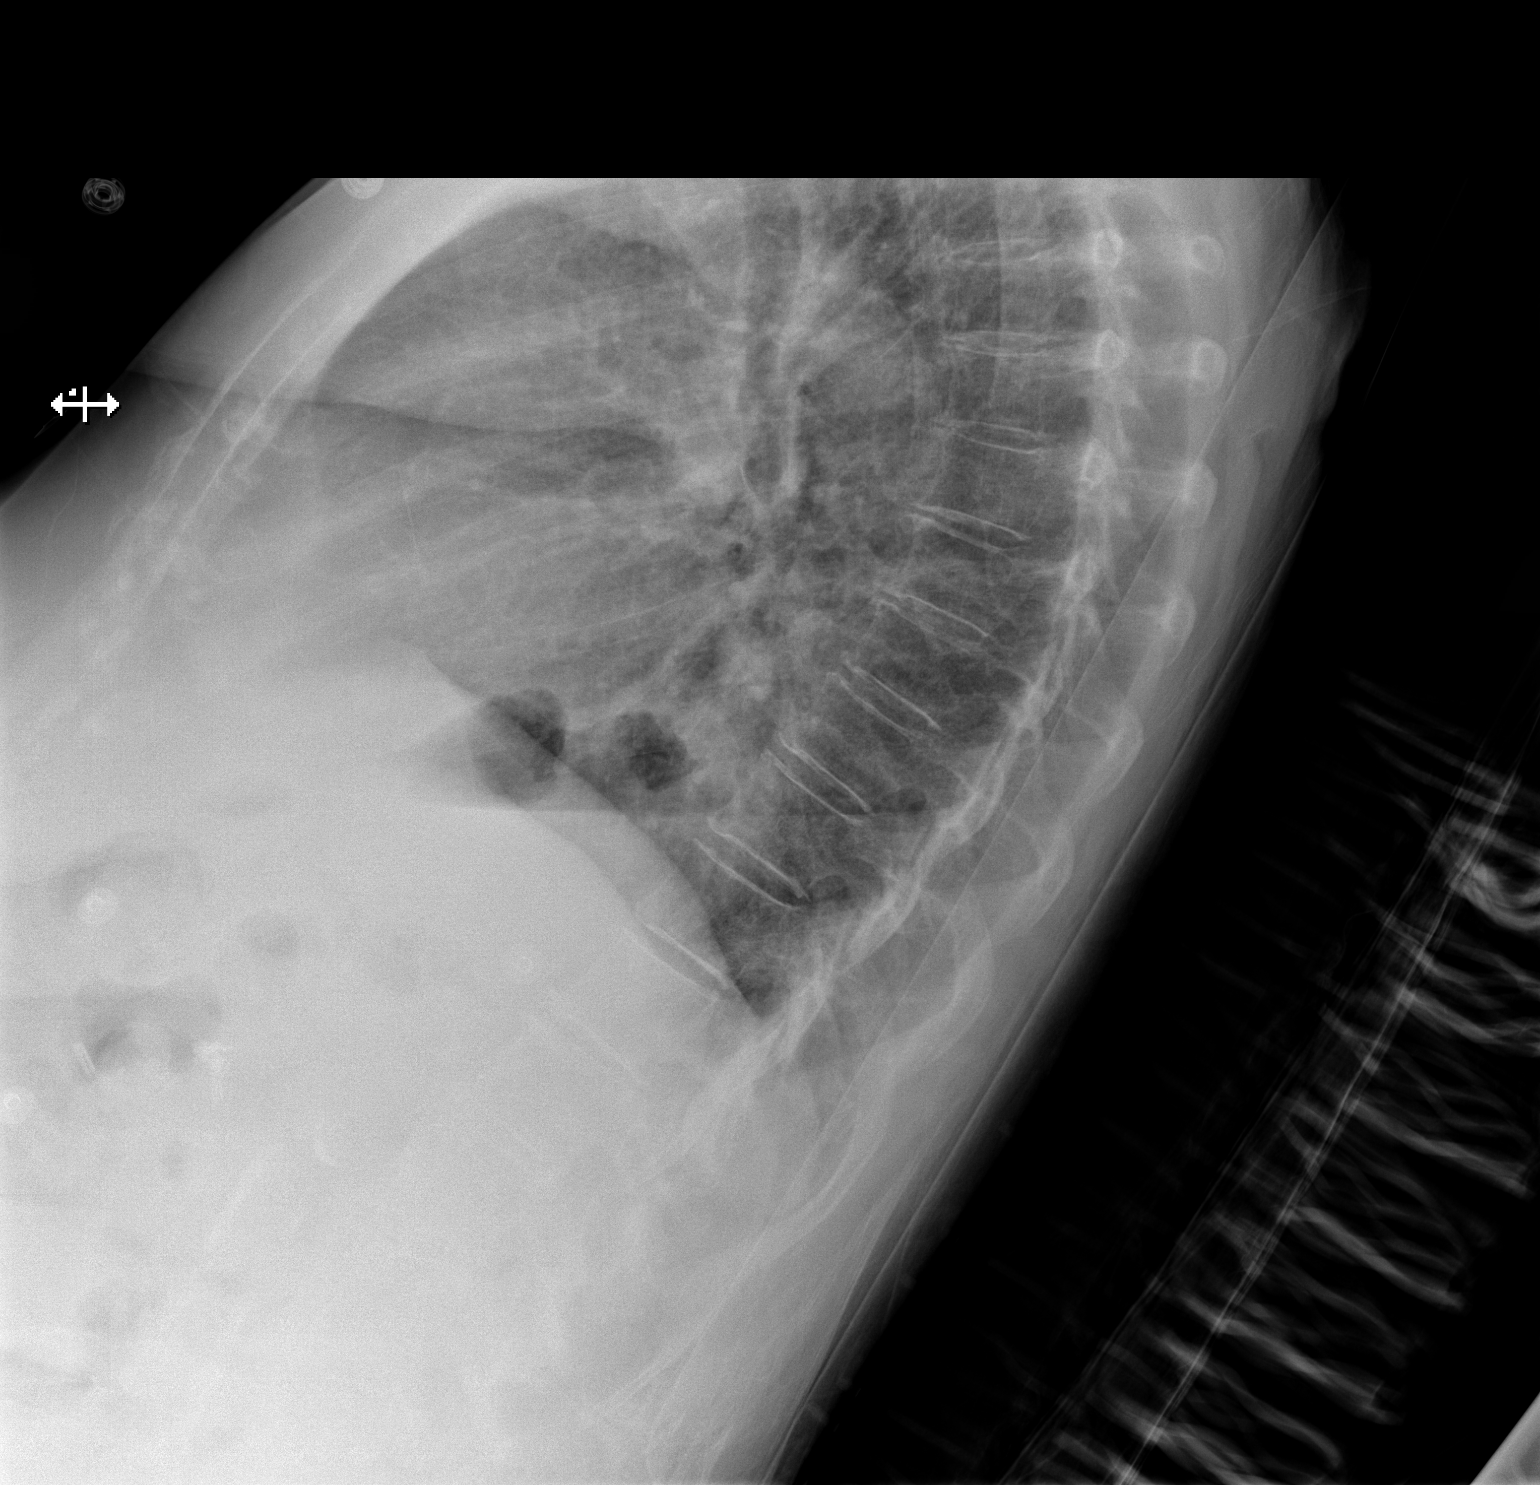

[x chest ap]
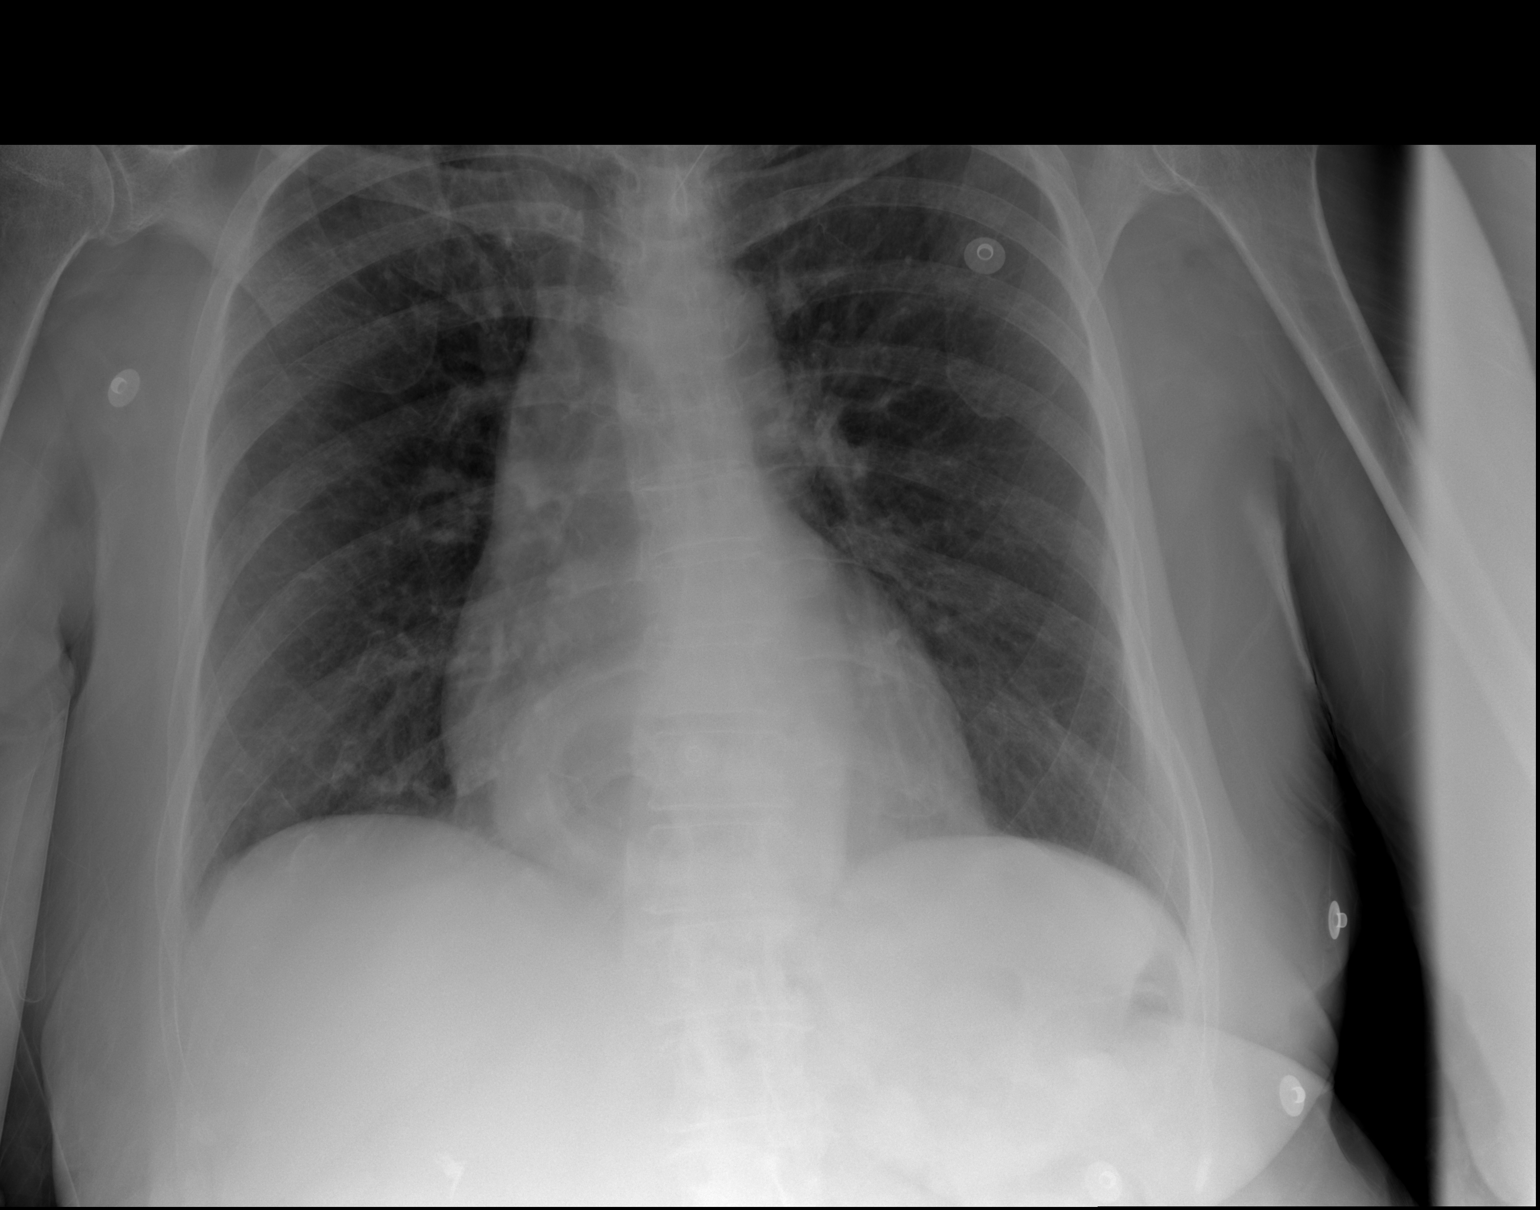

[2 of 2 positions shown; findings below may reference images not displayed]

FINDINGS: Lungs are clear.  No pleural effusion or pneumothorax.

The heart is normal in size.

Moderate hiatal hernia.

Visualized osseous structures are within normal limits.
IMPRESSION: Normal chest radiographs.

## 2024-04-09 IMAGING — DX DG CHEST 1V PORT
1 series · 1 of 1 positions shown · non-contrast
Comparison: 12/29/2020

CLINICAL DATA: Altered mental status

EXAM:
PORTABLE CHEST 1 VIEW

[chest ap]
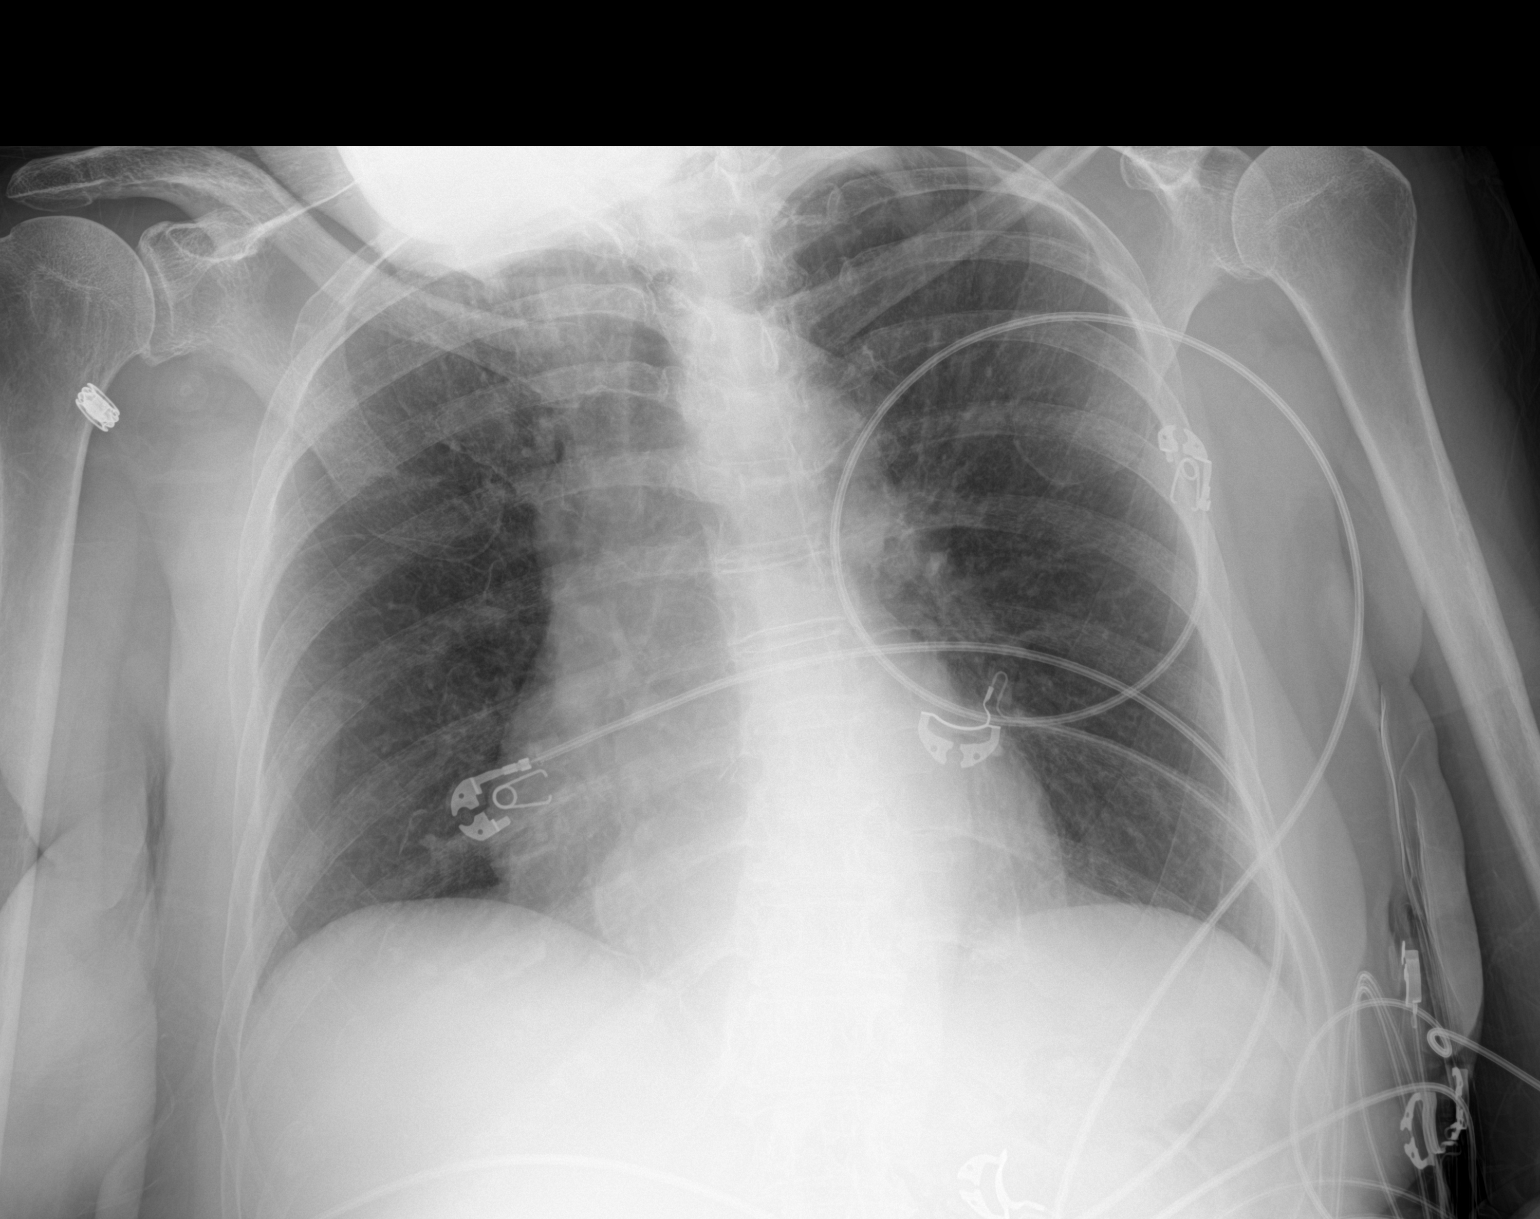

[1 of 1 positions shown; findings below may reference images not displayed]

FINDINGS: Patient is slightly rotated. The heart size and mediastinal contours
are within normal limits. Both lungs are clear. The visualized
skeletal structures are unremarkable.
IMPRESSION: No active disease.
